# Patient Record
Sex: Male | Born: 1955 | Race: Black or African American | Hispanic: No | Marital: Single | Smoking: Former smoker
Health system: Southern US, Community
[De-identification: ages and names within clinical notes are randomized; demographics above are authoritative.]

## PROBLEM LIST (undated history)

## (undated) DIAGNOSIS — J449 Chronic obstructive pulmonary disease, unspecified: Secondary | ICD-10-CM

## (undated) DIAGNOSIS — I2510 Atherosclerotic heart disease of native coronary artery without angina pectoris: Secondary | ICD-10-CM

## (undated) DIAGNOSIS — I509 Heart failure, unspecified: Secondary | ICD-10-CM

## (undated) DIAGNOSIS — I10 Essential (primary) hypertension: Secondary | ICD-10-CM

---

## 2012-12-21 ENCOUNTER — Emergency Department (HOSPITAL_COMMUNITY)
Admission: EM | Admit: 2012-12-21 | Discharge: 2012-12-21 | Disposition: A | Discharge status: Home / Self Care | Payer: Self-pay | Attending: Emergency Medicine | Admitting: Emergency Medicine

## 2012-12-21 ENCOUNTER — Emergency Department (HOSPITAL_COMMUNITY): Payer: Self-pay

## 2012-12-21 ENCOUNTER — Encounter (HOSPITAL_COMMUNITY): Payer: Self-pay | Admitting: *Deleted

## 2012-12-21 DIAGNOSIS — F17200 Nicotine dependence, unspecified, uncomplicated: Secondary | ICD-10-CM | POA: Insufficient documentation

## 2012-12-21 DIAGNOSIS — J387 Other diseases of larynx: Secondary | ICD-10-CM | POA: Insufficient documentation

## 2012-12-21 DIAGNOSIS — J029 Acute pharyngitis, unspecified: Secondary | ICD-10-CM | POA: Insufficient documentation

## 2012-12-21 LAB — POCT I-STAT, CHEM 8
BUN: 19 mg/dL (ref 6–23)
Calcium, Ion: 1.06 mmol/L — ABNORMAL LOW (ref 1.12–1.23)
Chloride: 101 mEq/L (ref 96–112)
Creatinine, Ser: 0.7 mg/dL (ref 0.50–1.35)
TCO2: 28 mmol/L (ref 0–100)

## 2012-12-21 LAB — CBC WITH DIFFERENTIAL/PLATELET
Eosinophils Relative: 0 % (ref 0–5)
HCT: 45 % (ref 39.0–52.0)
Hemoglobin: 14.7 g/dL (ref 13.0–17.0)
Lymphocytes Relative: 17 % (ref 12–46)
Lymphs Abs: 2 10*3/uL (ref 0.7–4.0)
MCV: 77.9 fL — ABNORMAL LOW (ref 78.0–100.0)
Monocytes Absolute: 0.6 10*3/uL (ref 0.1–1.0)
Neutro Abs: 8.8 10*3/uL — ABNORMAL HIGH (ref 1.7–7.7)
RBC: 5.78 MIL/uL (ref 4.22–5.81)
WBC: 11.4 10*3/uL — ABNORMAL HIGH (ref 4.0–10.5)

## 2012-12-21 MED ORDER — IOHEXOL 300 MG/ML  SOLN
75.0000 mL | Freq: Once | INTRAMUSCULAR | Status: AC | PRN
  Administered 2012-12-21: 75 mL via INTRAVENOUS

## 2012-12-21 MED ORDER — PENICILLIN V POTASSIUM 250 MG/5ML PO SOLR: 500.0000 mg | Freq: Four times a day (QID) | ORAL | Status: DC

## 2012-12-21 MED ORDER — HYDROCODONE-ACETAMINOPHEN 7.5-500 MG/15ML PO SOLN: 10.0000 mL | Freq: Four times a day (QID) | ORAL | Status: DC | PRN

## 2012-12-21 MED ORDER — METOCLOPRAMIDE HCL 5 MG/ML IJ SOLN
10.0000 mg | Freq: Once | INTRAMUSCULAR | Status: AC
  Administered 2012-12-21: 10 mg via INTRAVENOUS
  Filled 2012-12-21: qty 2

## 2012-12-21 MED ORDER — DEXAMETHASONE SODIUM PHOSPHATE 4 MG/ML IJ SOLN
10.0000 mg | Freq: Once | INTRAMUSCULAR | Status: AC
  Administered 2012-12-21: 10 mg via INTRAVENOUS
  Filled 2012-12-21: qty 3

## 2012-12-21 MED ORDER — SODIUM CHLORIDE 0.9 % IV SOLN: 1000.0000 mL | INTRAVENOUS | Status: DC

## 2012-12-21 MED ORDER — SODIUM CHLORIDE 0.9 % IV SOLN
1000.0000 mL | Freq: Once | INTRAVENOUS | Status: AC
  Administered 2012-12-21: 1000 mL via INTRAVENOUS

## 2012-12-21 MED ORDER — MORPHINE SULFATE 2 MG/ML IJ SOLN
INTRAMUSCULAR | Status: AC
  Administered 2012-12-21: 4 mg via INTRAVENOUS
  Filled 2012-12-21: qty 2

## 2012-12-21 MED ORDER — CLINDAMYCIN PHOSPHATE 900 MG/50ML IV SOLN
900.0000 mg | Freq: Once | INTRAVENOUS | Status: AC
  Administered 2012-12-21: 900 mg via INTRAVENOUS
  Filled 2012-12-21: qty 50

## 2012-12-21 MED ORDER — DIPHENHYDRAMINE HCL 50 MG/ML IJ SOLN
25.0000 mg | Freq: Once | INTRAMUSCULAR | Status: AC
  Administered 2012-12-21: 25 mg via INTRAVENOUS
  Filled 2012-12-21: qty 1

## 2012-12-21 MED ORDER — MORPHINE SULFATE 4 MG/ML IJ SOLN: 4.0000 mg | Freq: Once | INTRAMUSCULAR | Status: AC

## 2012-12-21 NOTE — ED Notes (Signed)
Dr Knapp at bedside,  

## 2012-12-21 NOTE — ED Notes (Signed)
Pt c/o sore throat for the past  4 days, headache, pt states that it hurts to swallow, talk, voice sounds muffled when pt is speaking,

## 2012-12-21 NOTE — ED Notes (Signed)
Gave patient ginger ale as requested. Patient able to swallow and keep down fluids but does complain of pain with swallowing.

## 2012-12-21 NOTE — ED Notes (Signed)
Pt states that the pan medication helped with his throat but the headache he has been having has not changed, Dr. Lynelle Doctor notified,

## 2012-12-21 NOTE — ED Notes (Signed)
Pt states sore throat x 4 days. Voice is muffled, states it is difficult to talk or swallow. Pt states he has been drooling, especially at night and unable to sleep.

## 2012-12-21 NOTE — ED Provider Notes (Signed)
CSN: [id number]     Arrival date & time 12/21/12  1711 History   First MD Initiated Contact with Patient 12/21/12 1724     Chief Complaint  Patient presents with  . Sore Throat   (Consider location/radiation/quality/duration/timing/severity/associated sxs/prior Treatment) HPI Patient relates he's had a mild sore throat however has gotten worse the past 4 days. He states he has pain more on the right than the left. He denies ear pain but states his ears feel full. He denies fever but has had chills. He states he normally gets a sore throat about once a year but never had one this bad before. He denies having any shortness of breath. He states he is able to swallow but is extremely painful.   PCP none  History reviewed. No pertinent past medical history. History reviewed. No pertinent past surgical history. No family history on file. History  Substance Use Topics  . Smoking status: Current Every Day Smoker    Types: Cigarettes  . Smokeless tobacco: Not on file  . Alcohol Use: No  employed  Review of Systems  All other systems reviewed and are negative.    Allergies  Review of patient's allergies indicates no known allergies.  Home Medications  No current outpatient prescriptions on file.  BP 154/89  Pulse 100  Temp(Src) 99.6 F (37.6 C) (Oral)  Resp 19  Ht 5\' 9"  (1.753 m)  Wt 155 lb (70.308 kg)  BMI 22.88 kg/m2  SpO2 96%  Vital signs normal except tachycardia, low grade temp   Physical Exam  Nursing note and vitals reviewed. Constitutional: He is oriented to person, place, and time.  Non-toxic appearance. He does not appear ill. No distress.  Thin male  HENT:  Head: Normocephalic and atraumatic.  Right Ear: External ear normal.  Left Ear: External ear normal.  Nose: Nose normal. No mucosal edema or rhinorrhea.  Mouth/Throat: Uvula is midline and mucous membranes are normal. No dental abscesses or edematous. Posterior oropharyngeal erythema present.  Pt has  deeply reddened tonsils bilaterally, no soft palate swelling or asymmetry, pt has "hot potato voice". No drooling. Tongue dry. Tender under the tonsillar bases without cervical lymphadenopathy  Eyes: Conjunctivae and EOM are normal. Pupils are equal, round, and reactive to light.  Neck: Normal range of motion and full passive range of motion without pain. Neck supple.  Cardiovascular: Normal rate and regular rhythm.   Pulmonary/Chest: Effort normal. No respiratory distress. He has no rhonchi. He exhibits no crepitus.  Abdominal: Normal appearance.  Musculoskeletal: Normal range of motion. He exhibits no edema and no tenderness.  Moves all extremities well.   Neurological: He is alert and oriented to person, place, and time. He has normal strength. No cranial nerve deficit.  Skin: Skin is warm, dry and intact. No rash noted. No erythema. No pallor.  Psychiatric: He has a normal mood and affect. His speech is normal and behavior is normal. His mood appears not anxious.    ED Course  Procedures (including critical care time)  Medications  0.9 %  sodium chloride infusion (0 mLs Intravenous Stopped 12/21/12 2014)    Followed by  0.9 %  sodium chloride infusion (not administered)  morphine 4 MG/ML injection 4 mg (0 mg Intravenous Duplicate 12/21/12 1818)  clindamycin (CLEOCIN) IVPB 900 mg (0 mg Intravenous Stopped 12/21/12 2014)  dexamethasone (DECADRON) injection 10 mg (10 mg Intravenous Given 12/21/12 1815)  morphine 2 MG/ML injection (4 mg Intravenous Given 12/21/12 1816)  iohexol (OMNIPAQUE) 300 MG/ML solution  75 mL (75 mLs Intravenous Contrast Given 12/21/12 1841)  metoCLOPramide (REGLAN) injection 10 mg (10 mg Intravenous Given 12/21/12 1922)  diphenhydrAMINE (BENADRYL) injection 25 mg (25 mg Intravenous Given 12/21/12 1922)    PT is able to drink prior to leaving the ED.   We discussed his CT results and the need to be seen soon by ENT.   Labs Review  Results for orders placed during the  hospital encounter of 12/21/12  RAPID STREP SCREEN      Result Value Range   Streptococcus, Group A Screen (Direct) NEGATIVE  NEGATIVE  CBC WITH DIFFERENTIAL      Result Value Range   WBC 11.4 (*) 4.0 - 10.5 K/uL   RBC 5.78  4.22 - 5.81 MIL/uL   Hemoglobin 14.7  13.0 - 17.0 g/dL   HCT 57.8  46.9 - 62.9 %   MCV 77.9 (*) 78.0 - 100.0 fL   MCH 25.4 (*) 26.0 - 34.0 pg   MCHC 32.7  30.0 - 36.0 g/dL   RDW 52.8  41.3 - 24.4 %   Platelets 391  150 - 400 K/uL   Neutrophils Relative % 77  43 - 77 %   Neutro Abs 8.8 (*) 1.7 - 7.7 K/uL   Lymphocytes Relative 17  12 - 46 %   Lymphs Abs 2.0  0.7 - 4.0 K/uL   Monocytes Relative 6  3 - 12 %   Monocytes Absolute 0.6  0.1 - 1.0 K/uL   Eosinophils Relative 0  0 - 5 %   Eosinophils Absolute 0.0  0.0 - 0.7 K/uL   Basophils Relative 0  0 - 1 %   Basophils Absolute 0.0  0.0 - 0.1 K/uL  POCT I-STAT, CHEM 8      Result Value Range   Sodium 140  135 - 145 mEq/L   Potassium 3.4 (*) 3.5 - 5.1 mEq/L   Chloride 101  96 - 112 mEq/L   BUN 19  6 - 23 mg/dL   Creatinine, Ser 0.10  0.50 - 1.35 mg/dL   Glucose, Bld 96  70 - 99 mg/dL   Calcium, Ion 2.72 (*) 1.12 - 1.23 mmol/L   TCO2 28  0 - 100 mmol/L   Hemoglobin 17.0  13.0 - 17.0 g/dL   HCT 53.6  64.4 - 03.4 %   Laboratory interpretation all normal except leukocytosis   Imaging Review Ct Soft Tissue Neck W Contrast  12/21/2012   CLINICAL DATA:  Muffled speech and neck pain.  EXAM: CT NECK WITH CONTRAST  TECHNIQUE: Multidetector CT imaging of the neck was performed using the standard protocol following the bolus administration of intravenous contrast.  CONTRAST:  75mL OMNIPAQUE IOHEXOL 300 MG/ML  SOLN  COMPARISON:  None.  FINDINGS: The visualized portion of the brain is unremarkable. There is bilateral maxillary sinus and right sphenoid sinus disease with fairly extensive mucoperiosteal thickening. The mastoid air cells and middle ear cavities are clear except for some scattered fluid on the left  The 8  parotid and submandibular glands are unremarkable and appear symmetric bilaterally. The tongue base and floor of the mouth are unremarkable.  The epiglottis is normal and the paraglottic fat planes are maintained. The right area epiglottic fold appears slightly thickened and there is asymmetric enhancement. This appears to extend into a ill-defined enhancing masslike lesion measuring approximately 2 cm in the right supraglottic area at the level of the piriform sinus. I do not see a discrete rim enhancing abscess. It is  possible this could be enhancing inflammatory phlegmon but could not exclude the possibility of a mass. Recommend ENT consultation direct visualization.  There are borderline enlarged bilateral neck nodes extending down into the supraclavicular areas bilaterally. The thyroid gland is normal. The major vascular structures are intact. Dense carotid artery calcifications are noted near the bifurcation.  The lung apices are clear. Fairly extensive cervical spine degenerative disease with large anterior spurs.  IMPRESSION: 2 cm ill-defined area of enhancement and slight mass effect in the right supraglottic neck just below the right piriform sinus. This is not have the appearance of an abscess. It could be enhancing inflammatory phlegmon but is more worrisome for a neoplasm. Recommend ENT consultation.  Bilateral borderline neck nodes.   Electronically Signed   By: Loralie Champagne M.D.   On: 12/21/2012 19:16    MDM   1. Sore throat   2. Supraglottic mass    Discharge Medication List as of 12/21/2012  8:38 PM    START taking these medications   Details  HYDROcodone-acetaminophen (LORTAB) 7.5-500 MG/15ML solution Take 10 mLs by mouth every 6 (six) hours as needed for pain., Starting 12/21/2012, Until Discontinued, Print    penicillin v potassium (VEETID) 250 MG/5ML solution Take 10 mLs (500 mg total) by mouth 4 (four) times daily., Starting 12/21/2012, Until Discontinued, Print        Plan  discharge   Devoria Albe, MD, Franz Dell, MD 12/21/12 2111

## 2012-12-24 LAB — CULTURE, GROUP A STREP

## 2013-01-08 ENCOUNTER — Ambulatory Visit (INDEPENDENT_AMBULATORY_CARE_PROVIDER_SITE_OTHER): Payer: Self-pay | Admitting: Otolaryngology

## 2020-01-15 ENCOUNTER — Emergency Department (HOSPITAL_COMMUNITY): Payer: Medicare Other

## 2020-01-15 ENCOUNTER — Encounter (HOSPITAL_COMMUNITY): Payer: Self-pay | Admitting: *Deleted

## 2020-01-15 ENCOUNTER — Other Ambulatory Visit: Payer: Self-pay

## 2020-01-15 ENCOUNTER — Inpatient Hospital Stay (HOSPITAL_COMMUNITY)
Admission: EM | Admit: 2020-01-15 | Discharge: 2020-01-25 | DRG: 907 | Disposition: A | Discharge status: Home Health | Payer: Medicare Other | Attending: Family Medicine | Admitting: Family Medicine

## 2020-01-15 DIAGNOSIS — R1314 Dysphagia, pharyngoesophageal phase: Secondary | ICD-10-CM | POA: Diagnosis present

## 2020-01-15 DIAGNOSIS — Z7951 Long term (current) use of inhaled steroids: Secondary | ICD-10-CM

## 2020-01-15 DIAGNOSIS — K209 Esophagitis, unspecified without bleeding: Secondary | ICD-10-CM

## 2020-01-15 DIAGNOSIS — M272 Inflammatory conditions of jaws: Secondary | ICD-10-CM | POA: Diagnosis present

## 2020-01-15 DIAGNOSIS — I2510 Atherosclerotic heart disease of native coronary artery without angina pectoris: Secondary | ICD-10-CM | POA: Diagnosis present

## 2020-01-15 DIAGNOSIS — Z79899 Other long term (current) drug therapy: Secondary | ICD-10-CM

## 2020-01-15 DIAGNOSIS — J181 Lobar pneumonia, unspecified organism: Secondary | ICD-10-CM | POA: Diagnosis not present

## 2020-01-15 DIAGNOSIS — R627 Adult failure to thrive: Secondary | ICD-10-CM | POA: Diagnosis present

## 2020-01-15 DIAGNOSIS — R0600 Dyspnea, unspecified: Secondary | ICD-10-CM

## 2020-01-15 DIAGNOSIS — D509 Iron deficiency anemia, unspecified: Secondary | ICD-10-CM | POA: Diagnosis present

## 2020-01-15 DIAGNOSIS — K31819 Angiodysplasia of stomach and duodenum without bleeding: Secondary | ICD-10-CM | POA: Diagnosis not present

## 2020-01-15 DIAGNOSIS — E43 Unspecified severe protein-calorie malnutrition: Secondary | ICD-10-CM | POA: Diagnosis present

## 2020-01-15 DIAGNOSIS — R1319 Other dysphagia: Secondary | ICD-10-CM | POA: Diagnosis not present

## 2020-01-15 DIAGNOSIS — T85590A Other mechanical complication of bile duct prosthesis, initial encounter: Secondary | ICD-10-CM | POA: Diagnosis present

## 2020-01-15 DIAGNOSIS — Z4659 Encounter for fitting and adjustment of other gastrointestinal appliance and device: Secondary | ICD-10-CM | POA: Diagnosis not present

## 2020-01-15 DIAGNOSIS — J9620 Acute and chronic respiratory failure, unspecified whether with hypoxia or hypercapnia: Secondary | ICD-10-CM

## 2020-01-15 DIAGNOSIS — K922 Gastrointestinal hemorrhage, unspecified: Secondary | ICD-10-CM | POA: Diagnosis not present

## 2020-01-15 DIAGNOSIS — E876 Hypokalemia: Secondary | ICD-10-CM | POA: Diagnosis present

## 2020-01-15 DIAGNOSIS — J449 Chronic obstructive pulmonary disease, unspecified: Secondary | ICD-10-CM | POA: Diagnosis present

## 2020-01-15 DIAGNOSIS — R1011 Right upper quadrant pain: Secondary | ICD-10-CM

## 2020-01-15 DIAGNOSIS — Z20822 Contact with and (suspected) exposure to covid-19: Secondary | ICD-10-CM | POA: Diagnosis present

## 2020-01-15 DIAGNOSIS — T859XXA Unspecified complication of internal prosthetic device, implant and graft, initial encounter: Secondary | ICD-10-CM

## 2020-01-15 DIAGNOSIS — K8065 Calculus of gallbladder and bile duct with chronic cholecystitis with obstruction: Secondary | ICD-10-CM | POA: Diagnosis present

## 2020-01-15 DIAGNOSIS — R1013 Epigastric pain: Secondary | ICD-10-CM | POA: Diagnosis present

## 2020-01-15 DIAGNOSIS — I6521 Occlusion and stenosis of right carotid artery: Secondary | ICD-10-CM | POA: Diagnosis present

## 2020-01-15 DIAGNOSIS — Z7982 Long term (current) use of aspirin: Secondary | ICD-10-CM

## 2020-01-15 DIAGNOSIS — J441 Chronic obstructive pulmonary disease with (acute) exacerbation: Secondary | ICD-10-CM | POA: Diagnosis present

## 2020-01-15 DIAGNOSIS — J690 Pneumonitis due to inhalation of food and vomit: Secondary | ICD-10-CM | POA: Diagnosis not present

## 2020-01-15 DIAGNOSIS — K31811 Angiodysplasia of stomach and duodenum with bleeding: Secondary | ICD-10-CM | POA: Diagnosis present

## 2020-01-15 DIAGNOSIS — I5021 Acute systolic (congestive) heart failure: Secondary | ICD-10-CM | POA: Diagnosis not present

## 2020-01-15 DIAGNOSIS — K2100 Gastro-esophageal reflux disease with esophagitis, without bleeding: Secondary | ICD-10-CM | POA: Diagnosis present

## 2020-01-15 DIAGNOSIS — K851 Biliary acute pancreatitis without necrosis or infection: Secondary | ICD-10-CM | POA: Diagnosis present

## 2020-01-15 DIAGNOSIS — Z9981 Dependence on supplemental oxygen: Secondary | ICD-10-CM

## 2020-01-15 DIAGNOSIS — K449 Diaphragmatic hernia without obstruction or gangrene: Secondary | ICD-10-CM | POA: Diagnosis present

## 2020-01-15 DIAGNOSIS — I110 Hypertensive heart disease with heart failure: Secondary | ICD-10-CM | POA: Diagnosis present

## 2020-01-15 DIAGNOSIS — J9621 Acute and chronic respiratory failure with hypoxia: Secondary | ICD-10-CM | POA: Diagnosis not present

## 2020-01-15 DIAGNOSIS — Z6822 Body mass index (BMI) 22.0-22.9, adult: Secondary | ICD-10-CM | POA: Diagnosis not present

## 2020-01-15 DIAGNOSIS — I10 Essential (primary) hypertension: Secondary | ICD-10-CM | POA: Diagnosis present

## 2020-01-15 DIAGNOSIS — K8050 Calculus of bile duct without cholangitis or cholecystitis without obstruction: Secondary | ICD-10-CM | POA: Diagnosis not present

## 2020-01-15 DIAGNOSIS — I4891 Unspecified atrial fibrillation: Secondary | ICD-10-CM | POA: Diagnosis present

## 2020-01-15 DIAGNOSIS — I5042 Chronic combined systolic (congestive) and diastolic (congestive) heart failure: Secondary | ICD-10-CM | POA: Diagnosis present

## 2020-01-15 DIAGNOSIS — F17210 Nicotine dependence, cigarettes, uncomplicated: Secondary | ICD-10-CM | POA: Diagnosis present

## 2020-01-15 DIAGNOSIS — J440 Chronic obstructive pulmonary disease with acute lower respiratory infection: Secondary | ICD-10-CM | POA: Diagnosis not present

## 2020-01-15 DIAGNOSIS — Y732 Prosthetic and other implants, materials and accessory gastroenterology and urology devices associated with adverse incidents: Secondary | ICD-10-CM | POA: Diagnosis present

## 2020-01-15 DIAGNOSIS — K831 Obstruction of bile duct: Secondary | ICD-10-CM

## 2020-01-15 DIAGNOSIS — K2289 Other specified disease of esophagus: Secondary | ICD-10-CM | POA: Diagnosis not present

## 2020-01-15 DIAGNOSIS — I509 Heart failure, unspecified: Secondary | ICD-10-CM

## 2020-01-15 DIAGNOSIS — Z8673 Personal history of transient ischemic attack (TIA), and cerebral infarction without residual deficits: Secondary | ICD-10-CM

## 2020-01-15 HISTORY — DX: Chronic obstructive pulmonary disease, unspecified: J44.9

## 2020-01-15 HISTORY — DX: Heart failure, unspecified: I50.9

## 2020-01-15 HISTORY — DX: Essential (primary) hypertension: I10

## 2020-01-15 HISTORY — DX: Atherosclerotic heart disease of native coronary artery without angina pectoris: I25.10

## 2020-01-15 LAB — URINALYSIS, ROUTINE W REFLEX MICROSCOPIC
Bilirubin Urine: NEGATIVE
Glucose, UA: NEGATIVE mg/dL
Hgb urine dipstick: NEGATIVE
Ketones, ur: NEGATIVE mg/dL
Leukocytes,Ua: NEGATIVE
Nitrite: NEGATIVE
Protein, ur: NEGATIVE mg/dL
Specific Gravity, Urine: 1.016 (ref 1.005–1.030)
pH: 5 (ref 5.0–8.0)

## 2020-01-15 LAB — COMPREHENSIVE METABOLIC PANEL
ALT: 73 U/L — ABNORMAL HIGH (ref 0–44)
AST: 90 U/L — ABNORMAL HIGH (ref 15–41)
Albumin: 2.6 g/dL — ABNORMAL LOW (ref 3.5–5.0)
Alkaline Phosphatase: 681 U/L — ABNORMAL HIGH (ref 38–126)
Anion gap: 14 (ref 5–15)
BUN: 15 mg/dL (ref 8–23)
CO2: 32 mmol/L (ref 22–32)
Calcium: 8.7 mg/dL — ABNORMAL LOW (ref 8.9–10.3)
Chloride: 88 mmol/L — ABNORMAL LOW (ref 98–111)
Creatinine, Ser: 0.39 mg/dL — ABNORMAL LOW (ref 0.61–1.24)
GFR, Estimated: >60 mL/min (ref >60)
Glucose, Bld: 103 mg/dL — ABNORMAL HIGH (ref 70–99)
Potassium: 2.7 mmol/L — CL (ref 3.5–5.1)
Sodium: 134 mmol/L — ABNORMAL LOW (ref 135–145)
Total Bilirubin: 2.6 mg/dL — ABNORMAL HIGH (ref 0.3–1.2)
Total Protein: 7.7 g/dL (ref 6.5–8.1)

## 2020-01-15 LAB — CBC WITH DIFFERENTIAL/PLATELET
Abs Immature Granulocytes: 0.08 10*3/uL — ABNORMAL HIGH (ref 0.00–0.07)
Basophils Absolute: 0 10*3/uL (ref 0.0–0.1)
Basophils Relative: 0 %
Eosinophils Absolute: 0 10*3/uL (ref 0.0–0.5)
Eosinophils Relative: 0 %
HCT: 33.1 % — ABNORMAL LOW (ref 39.0–52.0)
Hemoglobin: 10.2 g/dL — ABNORMAL LOW (ref 13.0–17.0)
Immature Granulocytes: 1 %
Lymphocytes Relative: 10 %
Lymphs Abs: 1.5 10*3/uL (ref 0.7–4.0)
MCH: 25.3 pg — ABNORMAL LOW (ref 26.0–34.0)
MCHC: 30.8 g/dL (ref 30.0–36.0)
MCV: 82.1 fL (ref 80.0–100.0)
Monocytes Absolute: 0.9 10*3/uL (ref 0.1–1.0)
Monocytes Relative: 6 %
Neutro Abs: 12.4 10*3/uL — ABNORMAL HIGH (ref 1.7–7.7)
Neutrophils Relative %: 83 %
Platelets: 586 10*3/uL — ABNORMAL HIGH (ref 150–400)
RBC: 4.03 MIL/uL — ABNORMAL LOW (ref 4.22–5.81)
RDW: 15.3 % (ref 11.5–15.5)
WBC: 14.8 10*3/uL — ABNORMAL HIGH (ref 4.0–10.5)
nRBC: 0 % (ref 0.0–0.2)

## 2020-01-15 LAB — RESP PANEL BY RT PCR (RSV, FLU A&B, COVID)
Influenza A by PCR: NEGATIVE
Influenza B by PCR: NEGATIVE
Respiratory Syncytial Virus by PCR: NEGATIVE
SARS Coronavirus 2 by RT PCR: NEGATIVE

## 2020-01-15 LAB — MAGNESIUM: Magnesium: 2 mg/dL (ref 1.7–2.4)

## 2020-01-15 LAB — LIPASE, BLOOD: Lipase: 21 U/L (ref 11–51)

## 2020-01-15 MED ORDER — HYDROMORPHONE HCL 1 MG/ML IJ SOLN
0.5000 mg | INTRAMUSCULAR | Status: DC | PRN
  Administered 2020-01-15 – 2020-01-20 (×39): 0.5 mg via INTRAVENOUS
  Filled 2020-01-15 (×6): qty 1
  Filled 2020-01-15: qty 0.5
  Filled 2020-01-15 (×7): qty 1
  Filled 2020-01-15: qty 0.5
  Filled 2020-01-15: qty 1
  Filled 2020-01-15: qty 0.5
  Filled 2020-01-15 (×4): qty 1
  Filled 2020-01-15: qty 0.5
  Filled 2020-01-15 (×2): qty 1
  Filled 2020-01-15: qty 0.5
  Filled 2020-01-15 (×4): qty 1
  Filled 2020-01-15 (×4): qty 0.5
  Filled 2020-01-15 (×3): qty 1
  Filled 2020-01-15: qty 0.5
  Filled 2020-01-15: qty 1
  Filled 2020-01-15: qty 0.5
  Filled 2020-01-15 (×2): qty 1

## 2020-01-15 MED ORDER — POTASSIUM CHLORIDE 10 MEQ/100ML IV SOLN
10.0000 meq | INTRAVENOUS | Status: AC
  Administered 2020-01-15 (×3): 10 meq via INTRAVENOUS
  Filled 2020-01-15 (×3): qty 100

## 2020-01-15 MED ORDER — LISINOPRIL 5 MG PO TABS
2.5000 mg | ORAL_TABLET | Freq: Every day | ORAL | Status: DC
  Administered 2020-01-15: 2.5 mg via ORAL
  Filled 2020-01-15: qty 1

## 2020-01-15 MED ORDER — CARVEDILOL 12.5 MG PO TABS
12.5000 mg | ORAL_TABLET | Freq: Two times a day (BID) | ORAL | Status: DC
  Administered 2020-01-15: 12.5 mg via ORAL
  Filled 2020-01-15: qty 1

## 2020-01-15 MED ORDER — SPIRONOLACTONE 25 MG PO TABS
25.0000 mg | ORAL_TABLET | Freq: Every day | ORAL | Status: DC
  Administered 2020-01-16 – 2020-01-25 (×9): 25 mg via ORAL
  Filled 2020-01-15 (×14): qty 1

## 2020-01-15 MED ORDER — DIGOXIN 125 MCG PO TABS
125.0000 ug | ORAL_TABLET | Freq: Every day | ORAL | Status: DC
  Administered 2020-01-16 – 2020-01-24 (×5): 125 ug via ORAL
  Filled 2020-01-15 (×16): qty 1

## 2020-01-15 MED ORDER — IOHEXOL 300 MG/ML  SOLN
100.0000 mL | Freq: Once | INTRAMUSCULAR | Status: AC | PRN
  Administered 2020-01-15: 100 mL via INTRAVENOUS

## 2020-01-15 MED ORDER — ONDANSETRON HCL 4 MG/2ML IJ SOLN: 4.0000 mg | Freq: Four times a day (QID) | INTRAMUSCULAR | Status: DC | PRN

## 2020-01-15 MED ORDER — ONDANSETRON HCL 4 MG/2ML IJ SOLN
4.0000 mg | Freq: Once | INTRAMUSCULAR | Status: AC
  Administered 2020-01-15: 4 mg via INTRAVENOUS
  Filled 2020-01-15: qty 2

## 2020-01-15 MED ORDER — POTASSIUM CHLORIDE CRYS ER 20 MEQ PO TBCR
40.0000 meq | EXTENDED_RELEASE_TABLET | Freq: Two times a day (BID) | ORAL | Status: DC
  Administered 2020-01-15 – 2020-01-20 (×11): 40 meq via ORAL
  Filled 2020-01-15 (×15): qty 2

## 2020-01-15 MED ORDER — SODIUM CHLORIDE 0.9 % IV SOLN: INTRAVENOUS | Status: DC

## 2020-01-15 MED ORDER — ONDANSETRON HCL 4 MG PO TABS
4.0000 mg | ORAL_TABLET | Freq: Four times a day (QID) | ORAL | Status: DC | PRN
  Filled 2020-01-15: qty 1

## 2020-01-15 MED ORDER — HYDROMORPHONE HCL 1 MG/ML IJ SOLN
1.0000 mg | Freq: Once | INTRAMUSCULAR | Status: AC
  Administered 2020-01-15: 1 mg via INTRAVENOUS
  Filled 2020-01-15: qty 1

## 2020-01-15 NOTE — Consult Note (Signed)
Maylon Peppers, M.D. Gastroenterology & Hepatology                                           Patient Name: Kenneth Coleman Account #: $[id number] @   MRN: [id number] Admission Date: 01/15/2020 Date of Evaluation:  01/15/2020 Time of Evaluation: 8:03 PM   Referring Physician: Christ Kick, MD  Chief Complaint: Dysphagia and choledocholithiasis  HPI:  This is a 64 y.o. male with history of COPD on home oxygn, CHF, HTN, coronary artery disease, hypertension, who comes to the hospital for evaluation of dysphagia and choledocholithiasis.  The patient is a poor historian.  The patient states he has presented progressive dysphagia to solids for the last 2 months.  He has had to bring significant amount of liquids to make the food go down but more recently he has only been able to tolerate liquids.  He is trying to drink Ensure as much as possible but he reports that his weight has been decreasing significantly, states that he has lost close to 40 pounds.  He has not presented any drooling but he is not able to eat any solid food at the moment.  He states having previous heartburn episodes but he is not having these anymore.  He also reports having intermittent episodes of epigastric pain and RUQ area for the last couple months.  States that the pain gets worse when food goes down to his stomach.  Has not presented any nausea, vomiting, fever, chills, abdominal distention, hematochezia or melena.  He is having regular bowel movements.  The patient brings some reports from [facility] on 12/11/2019.  At that time a CT of his chest showed thickening in the proximal esophagus, which was concerning for possible esophagitis versus malignancy.  No lymphadenopathy was detected at that time.  Also, a CT of the abdomen was performed due to elevated liver enzymes which showed presence of choledocholithiasis with intra and extrahepatic biliary dilation.  Given the fact there was no on-call gastroenterology  at the facility, the patient was discharged from the hospital with GI follow-up but he did not see a gastroenterologist after it.  Patient brings the reports of these tests but imaging is not available.  Notably, the patient reports that he had "a pancreatic procedure 18 to 20 years ago at [facility]".  No reports are available.  He does not know if he had an ERCP performed in the past or the reason why he had this test performed previously.  In the ED, he was HD stable and afebrile. Labs were remarkable for leukocytosis of 14,000, thrombocytosis 586, normocytic anemia with hemoglobin of 10.2 and MCV 82, potassium 2.7, chloride 88, BUN 19 elevated with alkaline phosphatase 681, AST 90, ALT 73, globulin 2.6, decreased albumin 3.6.  A CT of the abdomen with IV contrast showed presence of moderate intra and extrahepatic biliary ductal dilation with presence of biliary plastic stent.  The CBD is dilated up to 17 mm with a possible calculus in the common hepatic duct adjacent to the stent, although this could also represent stent encrustation.  Gallbladder is moderately distended without wall thickening.  There is diffuse pancreatic ductal dilation and parenchymal atrophy but no mass is seen.  Last EGD: never Last Colonoscopy: never  FHx: neg for any gastrointestinal/liver disease, no malignancies Social: smokes 1 pack of cigarettes every 3 days, neg alcohol.  He smokes  marijuana on a daily basis.  Denies any other illicit drug use  Past Medical History: SEE CHRONIC ISSSUES: Past Medical History:  Diagnosis Date  . CHF (congestive heart failure) ([place])   . COPD (chronic obstructive pulmonary disease) ([place])   . Coronary artery disease   . Hypertension    Past Surgical History: History reviewed. No pertinent surgical history. Family History: No family history on file. Social History:  Social History   Tobacco Use  . Smoking status: Current Every Day Smoker    Types: Cigarettes  . Smokeless tobacco:  Never Used  Vaping Use  . Vaping Use: Never used  Substance Use Topics  . Alcohol use: No  . Drug use: Never    Home Medications:  Prior to Admission medications   Medication Sig Start Date End Date Taking? Authorizing Provider  ASPIRIN LOW DOSE 81 MG EC tablet Take 81 mg by mouth daily. 12/22/19  Yes [provider]  BEVESPI AEROSPHERE 9-4.8 MCG/ACT AERO Take 2 puffs by mouth daily.  12/22/19  Yes [provider]  cholecalciferol (VITAMIN D) 25 MCG (1000 UNIT) tablet Take 1,000 Units by mouth daily.   Yes [provider]  furosemide (LASIX) 20 MG tablet Take 20 mg by mouth daily. 12/22/19  Yes [provider]  spironolactone (ALDACTONE) 25 MG tablet Take 25 mg by mouth daily. 12/22/19  Yes [provider]  carvedilol (COREG) 12.5 MG tablet Take 12.5 mg by mouth 2 (two) times daily. 11/18/19   [provider]  digoxin (LANOXIN) 0.125 MG tablet Take 125 mcg by mouth daily. 10/22/19   [provider]  HYDROcodone-acetaminophen (LORTAB) 7.5-500 MG/15ML solution Take 10 mLs by mouth every 6 (six) hours as needed for pain. Patient not taking: Reported on 01/15/2020 12/21/12   Rolland Porter, MD  lisinopril (ZESTRIL) 2.5 MG tablet Take 2.5 mg by mouth at bedtime. 10/06/19   [provider]  penicillin v potassium (VEETID) 250 MG/5ML solution Take 10 mLs (500 mg total) by mouth 4 (four) times daily. Patient not taking: Reported on 01/15/2020 12/21/12   Rolland Porter, MD    Inpatient Medications:  Current Facility-Administered Medications:  .  0.9 %  sodium chloride infusion, , Intravenous, Continuous, [name], DO, Last Rate: 100 mL/hr at 01/15/20 1946, New Bag at 01/15/20 1946 .  carvedilol (COREG) tablet 12.5 mg, 12.5 mg, Oral, BID, Stinson, Jacob J, DO .  digoxin (LANOXIN) tablet 125 mcg, 125 mcg, Oral, Daily, [name], DO .  HYDROmorphone (DILAUDID) injection 0.5 mg, 0.5 mg, Intravenous, Q2H PRN, [name], DO, 0.5  mg at 01/15/20 1925 .  lisinopril (ZESTRIL) tablet 2.5 mg, 2.5 mg, Oral, QHS, Stinson, Jacob J, DO .  ondansetron (ZOFRAN) tablet 4 mg, 4 mg, Oral, Q6H PRN **OR** ondansetron (ZOFRAN) injection 4 mg, 4 mg, Intravenous, Q6H PRN, Stinson, Jacob J, DO .  potassium chloride SA (KLOR-CON) CR tablet 40 mEq, 40 mEq, Oral, BID, Stinson, Jacob J, DO .  spironolactone (ALDACTONE) tablet 25 mg, 25 mg, Oral, Daily, [name], DO  Current Outpatient Medications:  .  ASPIRIN LOW DOSE 81 MG EC tablet, Take 81 mg by mouth daily., Disp: , Rfl:  .  BEVESPI AEROSPHERE 9-4.8 MCG/ACT AERO, Take 2 puffs by mouth daily. , Disp: , Rfl:  .  cholecalciferol (VITAMIN D) 25 MCG (1000 UNIT) tablet, Take 1,000 Units by mouth daily., Disp: , Rfl:  .  furosemide (LASIX) 20 MG tablet, Take 20 mg by mouth daily., Disp: ,  Rfl:  .  spironolactone (ALDACTONE) 25 MG tablet, Take 25 mg by mouth daily., Disp: , Rfl:  .  carvedilol (COREG) 12.5 MG tablet, Take 12.5 mg by mouth 2 (two) times daily., Disp: , Rfl:  .  digoxin (LANOXIN) 0.125 MG tablet, Take 125 mcg by mouth daily., Disp: , Rfl:  .  HYDROcodone-acetaminophen (LORTAB) 7.5-500 MG/15ML solution, Take 10 mLs by mouth every 6 (six) hours as needed for pain. (Patient not taking: Reported on 01/15/2020), Disp: 120 mL, Rfl: 0 .  lisinopril (ZESTRIL) 2.5 MG tablet, Take 2.5 mg by mouth at bedtime., Disp: , Rfl:  .  penicillin v potassium (VEETID) 250 MG/5ML solution, Take 10 mLs (500 mg total) by mouth 4 (four) times daily. (Patient not taking: Reported on 01/15/2020), Disp: 200 mL, Rfl: 0 Allergies: Patient has no known allergies.  Complete Review of Systems: GENERAL: negative for malaise, night sweats HEENT: No changes in hearing or vision, no nose bleeds or other nasal problems. NECK: Negative for lumps, goiter, pain and significant neck swelling RESPIRATORY: Negative for cough, wheezing CARDIOVASCULAR: Negative for chest pain, leg swelling, palpitations, orthopnea GI:  SEE HPI MUSCULOSKELETAL: Negative for joint pain or swelling, back pain, and muscle pain. SKIN: Negative for lesions, rash PSYCH: Negative for sleep disturbance, mood disorder and recent psychosocial stressors. HEMATOLOGY Negative for prolonged bleeding, bruising easily, and swollen nodes. ENDOCRINE: Negative for cold or heat intolerance, polyuria, polydipsia and goiter. NEURO: negative for tremor, gait imbalance, syncope and seizures. The remainder of the review of systems is noncontributory.  Physical Exam: BP 114/61   Pulse 61   Temp 98 F (36.7 C) (Oral)   Resp 17   SpO2 98%  GENERAL: The patient is AO x3, in no acute distress. Malnourished. HEENT: Head is normocephalic and atraumatic. EOMI are intact. Mouth is well hydrated and without lesions. Has temporal wasting. NECK: Supple. No masses LUNGS: Clear to auscultation. No presence of rhonchi/wheezing/rales. Adequate chest expansion HEART: RRR, normal s1 and s2. ABDOMEN: tender to palpation in the epigastric and RUQ region, no guarding, no peritoneal signs, and nondistended. BS +. No masses. EXTREMITIES: Without any cyanosis, clubbing, rash, lesions or edema. NEUROLOGIC: AOx3, no focal motor deficit. SKIN: no jaundice, no rashes   Laboratory Data CBC:     Component Value Date/Time   WBC 14.8 (H) 01/15/2020 1336   RBC 4.03 (L) 01/15/2020 1336   HGB 10.2 (L) 01/15/2020 1336   HCT 33.1 (L) 01/15/2020 1336   PLT 586 (H) 01/15/2020 1336   MCV 82.1 01/15/2020 1336   MCH 25.3 (L) 01/15/2020 1336   MCHC 30.8 01/15/2020 1336   RDW 15.3 01/15/2020 1336   LYMPHSABS 1.5 01/15/2020 1336   MONOABS 0.9 01/15/2020 1336   EOSABS 0.0 01/15/2020 1336   BASOSABS 0.0 01/15/2020 1336   COAG: No results found for: INR, PROTIME  BMP:  BMP Latest Ref Rng & Units 01/15/2020 12/21/2012  Glucose 70 - 99 mg/dL 103(H) 96  BUN 8 - 23 mg/dL 15 19  Creatinine 0.61 - 1.24 mg/dL 0.39(L) 0.70  Sodium 135 - 145 mmol/L 134(L) 140  Potassium 3.5 - 5.1  mmol/L 2.7(LL) 3.4(L)  Chloride 98 - 111 mmol/L 88(L) 101  CO2 22 - 32 mmol/L 32 -  Calcium 8.9 - 10.3 mg/dL 8.7(L) -    HEPATIC:  Hepatic Function Latest Ref Rng & Units 01/15/2020  Total Protein 6.5 - 8.1 g/dL 7.7  Albumin 3.5 - 5.0 g/dL 2.6(L)  AST 15 - 41 U/L 90(H)  ALT 0 -  44 U/L 73(H)  Alk Phosphatase 38 - 126 U/L 681(H)  Total Bilirubin 0.3 - 1.2 mg/dL 2.6(H)    CARDIAC: No results found for: CKTOTAL, CKMB, CKMBINDEX, TROPONINI   Imaging: I personally reviewed and interpreted the available imaging.  Assessment & Plan: Dalante Minus is a 64 y.o. male with history of COPD on home oxygn, CHF, HTN, coronary artery disease, hypertension, who comes to the hospital for evaluation of dysphagia and choledocholithiasis.  The patient has presented failure to thrive severe dysphagia that has limited his food intake and has led to major weight loss.  Given report of CT scan done at outside facility, there is high concern for malignancy in his esophagus.  Even though he has imaging findings that are concerning for choledocholithiasis versus retained stent in his CBD leading to ductal dilation  (unclear reason why the stent was placed in the first place at this moment), it may not be possible to pass down the ERCP scope if there is significant narrowing in his esophagus.  Due to these, we will hold on performing an ERCP at the moment until the patient has had an EGD for evaluation of his esophageal alteration.  For now, you know he is not presenting any SIRS, I consider he would benefit from antibiotic coverage as he has had leukocytosis which likely is secondary to biliary origin.  Based on findings of ED, will proceed with ERCP versus possible PTC.  The patient understood and agreed.  # Dysphagia # Esophageal thickening # Choledocholithiasis vs stent encrustation - EGD tomorrow AM - NPO after MN - Correct electrolytes aggressively before procedure - Daily CBC and CMP - Start Flagyl 500 mg IV  and ciprofloxacin 400 mg BID IV  Harvel Quale, MD Gastroenterology and Hepatology [facility]   Note: Occasional unusual wording and randomly placed punctuation marks may result from the use of speech recognition technology to transcribe this document

## 2020-01-15 NOTE — ED Provider Notes (Signed)
[facility] EMERGENCY DEPARTMENT Provider Note   CSN: [id number] Arrival date & time: 01/15/20  1208     History Chief Complaint  Patient presents with  . Abdominal Pain    Kenneth Coleman is a 64 y.o. male who presents emergency department chief complaint of abdominal pain and weight loss.  The patient is a very poor historian and the majority of the history is gathered from review of the patient's electronic medical records and outside medical records.  He complains of epigastric abdominal pain that is constant but much worse every time he tries to eat.  The pain is severe.  He has nausea but denies any active vomiting.  He is able to take in small sips of water but states that sometimes he "feels like it wants to come up."  He has never had anything like this before however this has been going on for some time.  Review of EMR shows that the patient was seen initially at [facility] and was found to have a 12 mm stone with moderate to severe biliary dilatation, bilirubin of 2.5, alk phos of 742 and a white count of 12.  They had tried to transfer the patient to a facility with capability of MRCP or ERCP however the patient states that he waited for very long time and eventually left.  Apparently this scenario again occurred at [facility] ED where the patient left AGAINST MEDICAL ADVICE because he states the wait was so long.  This was 01/13/2020.  The patient presents today for the same symptoms with severe pain.  He reports a 40 pound weight loss.  He has a history of COPD and is on constant oxygen at 2 L via nasal cannula.  He denies fevers or chills.  HPI     History reviewed. No pertinent past medical history.  There are no problems to display for this patient.   History reviewed. No pertinent surgical history.     No family history on file.  Social History   Tobacco Use  . Smoking status: Current Every Day Smoker    Types: Cigarettes  . Smokeless tobacco: Never Used    Vaping Use  . Vaping Use: Never used  Substance Use Topics  . Alcohol use: No  . Drug use: Never    Home Medications Prior to Admission medications   Medication Sig Start Date End Date Taking? Authorizing Provider  ASPIRIN LOW DOSE 81 MG EC tablet Take 81 mg by mouth daily. 12/22/19  Yes [provider]  BEVESPI AEROSPHERE 9-4.8 MCG/ACT AERO Take 2 puffs by mouth daily.  12/22/19  Yes [provider]  cholecalciferol (VITAMIN D) 25 MCG (1000 UNIT) tablet Take 1,000 Units by mouth daily.   Yes [provider]  furosemide (LASIX) 20 MG tablet Take 20 mg by mouth daily. 12/22/19  Yes [provider]  spironolactone (ALDACTONE) 25 MG tablet Take 25 mg by mouth daily. 12/22/19  Yes [provider]  carvedilol (COREG) 12.5 MG tablet Take 12.5 mg by mouth 2 (two) times daily. 11/18/19   [provider]  digoxin (LANOXIN) 0.125 MG tablet Take 125 mcg by mouth daily. 10/22/19   [provider]  HYDROcodone-acetaminophen (LORTAB) 7.5-500 MG/15ML solution Take 10 mLs by mouth every 6 (six) hours as needed for pain. Patient not taking: Reported on 01/15/2020 12/21/12   Rolland Porter, MD  lisinopril (ZESTRIL) 2.5 MG tablet Take 2.5 mg by mouth at bedtime. 10/06/19   [provider]  penicillin  v potassium (VEETID) 250 MG/5ML solution Take 10 mLs (500 mg total) by mouth 4 (four) times daily. Patient not taking: Reported on 01/15/2020 12/21/12   Rolland Porter, MD    Allergies    Patient has no known allergies.  Review of Systems   Review of Systems Ten systems reviewed and are negative for acute change, except as noted in the HPI.   Physical Exam Updated Vital Signs BP 107/60 (BP Location: Right Arm)   Pulse (!) 56   Temp 98 F (36.7 C) (Oral)   Resp 18   SpO2 100%   Physical Exam Vitals and nursing note reviewed.  Constitutional:      General: He is not in acute distress.    Appearance: He is well-developed and underweight. He is  not diaphoretic.  HENT:     Head: Normocephalic and atraumatic.  Eyes:     General: No scleral icterus.    Conjunctiva/sclera: Conjunctivae normal.  Cardiovascular:     Rate and Rhythm: Normal rate and regular rhythm.     Heart sounds: Normal heart sounds.  Pulmonary:     Effort: Pulmonary effort is normal. No respiratory distress.     Breath sounds: Normal breath sounds.  Abdominal:     General: Abdomen is scaphoid.     Palpations: Abdomen is soft.     Tenderness: There is abdominal tenderness in the right upper quadrant and epigastric area.  Musculoskeletal:     Cervical back: Normal range of motion and neck supple.  Skin:    General: Skin is warm and dry.  Neurological:     Mental Status: He is alert.  Psychiatric:        Behavior: Behavior normal.     ED Results / Procedures / Treatments   Labs (all labs ordered are listed, but only abnormal results are displayed) Labs Reviewed  CBC WITH DIFFERENTIAL/PLATELET - Abnormal; Notable for the following components:      Result Value   WBC 14.8 (*)    RBC 4.03 (*)    Hemoglobin 10.2 (*)    HCT 33.1 (*)    MCH 25.3 (*)    Platelets 586 (*)    Neutro Abs 12.4 (*)    Abs Immature Granulocytes 0.08 (*)    All other components within normal limits  COMPREHENSIVE METABOLIC PANEL - Abnormal; Notable for the following components:   Sodium 134 (*)    Potassium 2.7 (*)    Chloride 88 (*)    Glucose, Bld 103 (*)    Creatinine, Ser 0.39 (*)    Calcium 8.7 (*)    Albumin 2.6 (*)    AST 90 (*)    ALT 73 (*)    Alkaline Phosphatase 681 (*)    Total Bilirubin 2.6 (*)    All other components within normal limits  URINALYSIS, ROUTINE W REFLEX MICROSCOPIC - Abnormal; Notable for the following components:   Color, Urine AMBER (*)    All other components within normal limits  RESP PANEL BY RT PCR (RSV, FLU A&B, COVID)  LIPASE, BLOOD    EKG None  Radiology No results found.  Procedures Procedures (including critical care  time)  Medications Ordered in ED Medications  potassium chloride 10 mEq in 100 mL IVPB (10 mEq Intravenous New Bag/Given 01/15/20 1509)  HYDROmorphone (DILAUDID) injection 1 mg (1 mg Intravenous Given 01/15/20 1509)  ondansetron (ZOFRAN) injection 4 mg (4 mg Intravenous Given 01/15/20 1509)  iohexol (OMNIPAQUE) 300 MG/ML solution 100 mL (100 mLs  Intravenous Contrast Given 01/15/20 1557)    ED Course  I have reviewed the triage vital signs and the nursing notes.  Pertinent labs & imaging results that were available during my care of the patient were reviewed by me and considered in my medical decision making (see chart for details).  Clinical Course as of Jan 15 1607  Fri Jan 15, 2020  1458 Alkaline Phosphatase(!): 681 [AH]    Clinical Course User Index [AH] Margarita Mail, PA-C   MDM Rules/Calculators/A&P                         CC: Abdominal pain vomiting and weight loss VS:  Vitals:   01/15/20 1830 01/15/20 1900 01/15/20 1927 01/15/20 2000  BP: 113/63 114/61  (!) 113/56  Pulse: 68 61  80  Resp: _0 Temp:      TempSrc:      SpO2: 96% 98% 100% 100%    SF:KCLEXNT is gathered by EMR and patient. Previous records obtained and reviewed. DDX:The patient's complaint of abdominal pain involves an extensive number of diagnostic and treatment options, and is a complaint that carries with it a high risk of complications, morbidity, and potential mortality. Given the large differential diagnosis, medical decision making is of high complexity. The emergent DDX for RUQ pain includes but is not limited to Glabladder disease, PUD, Acute Hepatitis, Pancreatitis, pyelonephritis, Pneumonia, Lower lobe PE/Infarct, Kidney stone, GERD, retrocecal appendicitis, Fitz-Hugh-Curtis syndrome, AAA, MI, Zoster. Labs: I ordered reviewed and interpreted labs which included CBC with elevated white blood cell count at 14.8, urine amber-colored.  Covid test is negative.  Lipase within normal limits.  CMP  showed's potassium of 2.7, mildly low calcium.  Albumin low at 2.6  Imaging: I ordered and reviewed images which included RUQ ultrasound of the abdomen, CT abdomen pelvis with contrast. I independently visualized and interpreted all imaging. Significant findings include moderate biliary and intrahepatic ductal dilatation along with pancreatic ductal dilatation, plastic stent which is not functioning and what appears to be recurrent biliary obstruction. EKG atrial fibrillation at a rate of 47 Consults: Dr. Jenetta Downer with gastroenterology.  Patient will be admitted to the hospitalist service, he will have ERCP performed tomorrow. MDM: Patient here with biliary obstruction.  He is having severe weight loss, chronic pain.  Patient will need admission.  Pain control achieved with pain medications would have ordered every 2 hours.  Fluids given. Patient disposition:The patient appears reasonably stabilized for admission considering the current resources, flow, and capabilities available in the ED at this time, and I doubt any other [facility] requiring further screening and/or treatment in the ED prior to admission.         Final Clinical Impression(s) / ED Diagnoses Final diagnoses:  RUQ abdominal pain    Rx / DC Orders ED Discharge Orders    None       Margarita Mail, PA-C 01/15/20 2119    Daleen Bo, MD 01/15/20 2318

## 2020-01-15 NOTE — ED Notes (Signed)
CRITICAL VALUE ALERT  Critical Value: kt 2.7  Date & Time Notied:  0525    Provider Notified: [email or web address]  Orders Received/Actions taken:

## 2020-01-15 NOTE — ED Triage Notes (Signed)
Abdominal pain with weight loss for 2 months

## 2020-01-15 NOTE — H&P (Signed)
History and Physical  Kenneth Coleman MGQ:[id number] DOB: 1956/03/02 DOA: 01/15/2020  Referring physician: Margarita Mail, PA-C, ED provider PCP: System, Provider Not In  Outpatient Specialists:   Patient Coming From: home  Chief Complaint: abdominal pain  HPI: Kenneth Coleman is a 64 y.o. male with a history of COPD, coronary artery disease with CHF, hypertension.  Patient is a poor historian.  He presents to the emergency department for 2 months of generalized weakness and difficulty eating with right upper quadrant abdominal pain.  In September, he was hospitalized in [place], found to have intrahepatic and extrahepatic biliary duct dilation with stones.  It appears that he had an ERCP done with a stent placement.  He was continued to have intermittent abdominal pain, the last of which was started last night.  Pain is in his right upper quadrant and is nonradiating.  Eating increases his pain.  No palliating factors.  Pain was worsening over the past day.  Denies fevers, chills, diarrhea, constipation, abdominal distention, shortness of breath, chest pain.  Emergency Department Course: CT abdomen and right upper quadrant ultrasound show significant common bile duct distention to approximately 15 mm.  Additionally, appears to be a stone in the common bile duct.  CMP shows elevated LFTs.  Lipase is negative.  Potassium 2.7  Review of Systems:   Pt denies any fevers, chills, diarrhea, constipation, shortness of breath, dyspnea on exertion, orthopnea, cough, wheezing, palpitations, headache, vision changes, lightheadedness, dizziness, melena, rectal bleeding.  Review of systems are otherwise negative  Past Medical History:  Diagnosis Date  . CHF (congestive heart failure) ([facility])   . COPD (chronic obstructive pulmonary disease) ([place])   . Coronary artery disease   . Hypertension    History reviewed. No pertinent surgical history. Social History:  reports that he has been smoking  cigarettes. He has never used smokeless tobacco. He reports that he does not drink alcohol and does not use drugs. Patient lives at home  No Known Allergies  No family history on file.  Family history reviewed.  Noncontributory  Prior to Admission medications   Medication Sig Start Date End Date Taking? Authorizing Provider  ASPIRIN LOW DOSE 81 MG EC tablet Take 81 mg by mouth daily. 12/22/19  Yes [provider]  BEVESPI AEROSPHERE 9-4.8 MCG/ACT AERO Take 2 puffs by mouth daily.  12/22/19  Yes [provider]  cholecalciferol (VITAMIN D) 25 MCG (1000 UNIT) tablet Take 1,000 Units by mouth daily.   Yes [provider]  furosemide (LASIX) 20 MG tablet Take 20 mg by mouth daily. 12/22/19  Yes [provider]  spironolactone (ALDACTONE) 25 MG tablet Take 25 mg by mouth daily. 12/22/19  Yes [provider]  carvedilol (COREG) 12.5 MG tablet Take 12.5 mg by mouth 2 (two) times daily. 11/18/19   [provider]  digoxin (LANOXIN) 0.125 MG tablet Take 125 mcg by mouth daily. 10/22/19   [provider]  HYDROcodone-acetaminophen (LORTAB) 7.5-500 MG/15ML solution Take 10 mLs by mouth every 6 (six) hours as needed for pain. Patient not taking: Reported on 01/15/2020 12/21/12   Rolland Porter, MD  lisinopril (ZESTRIL) 2.5 MG tablet Take 2.5 mg by mouth at bedtime. 10/06/19   [provider]  penicillin v potassium (VEETID) 250 MG/5ML solution Take 10 mLs (500 mg total) by mouth 4 (four) times daily. Patient not taking: Reported on 01/15/2020 12/21/12   Rolland Porter, MD    Physical Exam: BP 113/63   Pulse 68   Temp 98  F (36.7 C) (Oral)   Resp 17   SpO2 96%   . General: Elderly male. Awake and alert and oriented x3. No acute cardiopulmonary distress.  Marland Kitchen HEENT: Normocephalic atraumatic.  Right and left ears normal in appearance.  Pupils equal, round, reactive to light. Extraocular muscles are intact. Sclerae anicteric and noninjected.  Moist  mucosal membranes. No mucosal lesions.  . Neck: Neck supple without lymphadenopathy. No carotid bruits. No masses palpated.  . Cardiovascular: Regular rate with normal S1-S2 sounds. No murmurs, rubs, gallops auscultated. No JVD.  Marland Kitchen Respiratory: Good respiratory effort with no wheezes, rales, rhonchi. Lungs clear to auscultation bilaterally.  No accessory muscle use. . Abdomen: Soft.  Tenderness in right upper quadrant and epigastric area with guarding active bowel sounds. No masses or hepatosplenomegaly  . Skin: No rashes, lesions, or ulcerations.  Dry, warm to touch. 2+ dorsalis pedis and radial pulses. . Musculoskeletal: No calf or leg pain. All major joints not erythematous nontender.  No upper or lower joint deformation.  Good ROM.  No contractures  . Psychiatric: Intact judgment and insight. Pleasant and cooperative. . Neurologic: No focal neurological deficits. Strength is 5/5 and symmetric in upper and lower extremities.  Cranial nerves II through XII are grossly intact.           Labs on Admission: I have personally reviewed following labs and imaging studies  CBC: Recent Labs  Lab 01/15/20 1336  WBC 14.8*  NEUTROABS 12.4*  HGB 10.2*  HCT 33.1*  MCV 82.1  PLT 762*   Basic Metabolic Panel: Recent Labs  Lab 01/15/20 1336  NA 134*  K 2.7*  CL 88*  CO2 32  GLUCOSE 103*  BUN 15  CREATININE 0.39*  CALCIUM 8.7*   GFR: CrCl cannot be calculated (Unknown ideal weight.). Liver Function Tests: Recent Labs  Lab 01/15/20 1336  AST 90*  ALT 73*  ALKPHOS 681*  BILITOT 2.6*  PROT 7.7  ALBUMIN 2.6*   Recent Labs  Lab 01/15/20 1336  LIPASE 21   No results for input(s): AMMONIA in the last 168 hours. Coagulation Profile: No results for input(s): INR, PROTIME in the last 168 hours. Cardiac Enzymes: No results for input(s): CKTOTAL, CKMB, CKMBINDEX, TROPONINI in the last 168 hours. BNP (last 3 results) No results for input(s): PROBNP in the last 8760  hours. HbA1C: No results for input(s): HGBA1C in the last 72 hours. CBG: No results for input(s): GLUCAP in the last 168 hours. Lipid Profile: No results for input(s): CHOL, HDL, LDLCALC, TRIG, CHOLHDL, LDLDIRECT in the last 72 hours. Thyroid Function Tests: No results for input(s): TSH, T4TOTAL, FREET4, T3FREE, THYROIDAB in the last 72 hours. Anemia Panel: No results for input(s): VITAMINB12, FOLATE, FERRITIN, TIBC, IRON, RETICCTPCT in the last 72 hours. Urine analysis:    Component Value Date/Time   COLORURINE AMBER (A) 01/15/2020 1355   APPEARANCEUR CLEAR 01/15/2020 1355   LABSPEC 1.016 01/15/2020 1355   PHURINE 5.0 01/15/2020 1355   GLUCOSEU NEGATIVE 01/15/2020 1355   HGBUR NEGATIVE 01/15/2020 [place] 01/15/2020 1355   KETONESUR NEGATIVE 01/15/2020 1355   PROTEINUR NEGATIVE 01/15/2020 1355   NITRITE NEGATIVE 01/15/2020 1355   LEUKOCYTESUR NEGATIVE 01/15/2020 1355   Sepsis Labs: @LABRCNTIP (procalcitonin:4,lacticidven:4) ) Recent Results (from the past 240 hour(s))  Resp Panel by RT PCR (RSV, Flu A&B, Covid) - Nasopharyngeal Swab     Status: None   Collection Time: 01/15/20  1:48 PM   Specimen: Nasopharyngeal Swab  Result Value Ref Range Status  SARS Coronavirus 2 by RT PCR NEGATIVE NEGATIVE Final    Comment: (NOTE) SARS-CoV-2 target nucleic acids are NOT DETECTED.  The SARS-CoV-2 RNA is generally detectable in upper respiratoy specimens during the acute phase of infection. The lowest concentration of SARS-CoV-2 viral copies this assay can detect is 131 copies/mL. A negative result does not preclude SARS-Cov-2 infection and should not be used as the sole basis for treatment or other patient management decisions. A negative result may occur with  improper specimen collection/handling, submission of specimen other than nasopharyngeal swab, presence of viral mutation(s) within the areas targeted by this assay, and inadequate number of viral  copies (<131 copies/mL). A negative result must be combined with clinical observations, patient history, and epidemiological information. The expected result is Negative.  Fact Sheet for Patients:  PinkCheek.be  Fact Sheet for Healthcare Providers:  GravelBags.it  This test is no t yet approved or cleared by the [place] FDA and  has been authorized for detection and/or diagnosis of SARS-CoV-2 by FDA under an Emergency Use Authorization (EUA). This EUA will remain  in effect (meaning this test can be used) for the duration of the COVID-19 declaration under Section 564(b)(1) of the Act, 21 U.S.C. section 360bbb-3(b)(1), unless the authorization is terminated or revoked sooner.     Influenza A by PCR NEGATIVE NEGATIVE Final   Influenza B by PCR NEGATIVE NEGATIVE Final    Comment: (NOTE) The Xpert Xpress SARS-CoV-2/FLU/RSV assay is intended as an aid in  the diagnosis of influenza from Nasopharyngeal swab specimens and  should not be used as a sole basis for treatment. Nasal washings and  aspirates are unacceptable for Xpert Xpress SARS-CoV-2/FLU/RSV  testing.  Fact Sheet for Patients: PinkCheek.be  Fact Sheet for Healthcare Providers: GravelBags.it  This test is not yet approved or cleared by the [place] FDA and  has been authorized for detection and/or diagnosis of SARS-CoV-2 by  FDA under an Emergency Use Authorization (EUA). This EUA will remain  in effect (meaning this test can be used) for the duration of the  Covid-19 declaration under Section 564(b)(1) of the Act, 21  U.S.C. section 360bbb-3(b)(1), unless the authorization is  terminated or revoked.    Respiratory Syncytial Virus by PCR NEGATIVE NEGATIVE Final    Comment: (NOTE) Fact Sheet for Patients: PinkCheek.be  Fact Sheet for Healthcare  Providers: GravelBags.it  This test is not yet approved or cleared by the [place] FDA and  has been authorized for detection and/or diagnosis of SARS-CoV-2 by  FDA under an Emergency Use Authorization (EUA). This EUA will remain  in effect (meaning this test can be used) for the duration of the  COVID-19 declaration under Section 564(b)(1) of the Act, 21 U.S.C.  section 360bbb-3(b)(1), unless the authorization is terminated or  revoked. Performed at [facility], [address], [place], [place] [address]      Radiological Exams on Admission: CT ABDOMEN PELVIS W CONTRAST  Result Date: 01/15/2020 CLINICAL DATA:  Abdominal pain with 40 lb weight loss over the last 2 months. Abdominal abscess/infection suspected. EXAM: CT ABDOMEN AND PELVIS WITH CONTRAST TECHNIQUE: Multidetector CT imaging of the abdomen and pelvis was performed using the standard protocol following bolus administration of intravenous contrast. CONTRAST:  164mL OMNIPAQUE IOHEXOL 300 MG/ML  SOLN COMPARISON:  Report only from remote abdominal CT 09/07/2008 and ERCP 09/09/2008. FINDINGS: Lower chest: Mild linear scarring in the right middle lobe. The lung bases are otherwise clear. No significant pleural or pericardial effusion. Aortic and  coronary artery atherosclerosis noted. Hepatobiliary: No focal hepatic abnormalities or abnormal enhancement identified. There is moderate intra and extrahepatic biliary dilatation. There is a plastic biliary stent in place. The common hepatic duct appears to measure up to 17 mm in diameter on image 31/2 which is larger than appreciated on ultrasound the same date. There is a possible calculus in the common hepatic duct adjacent to the stent versus stent encrustation. In addition, there is an 11 mm soft tissue nodule at the hepatic hilum on image 27/2 which could be intrabiliary. The gallbladder is moderately distended without wall thickening or surrounding inflammation.  Pancreas: Mild diffuse pancreatic ductal dilatation and parenchymal atrophy, previously reported. No focal mass lesion or surrounding inflammation. Spleen: Normal in size without focal abnormality. Adrenals/Urinary Tract: Both adrenal glands appear normal. The kidneys appear normal without evidence of urinary tract calculus, suspicious lesion or hydronephrosis. No bladder abnormalities are seen. Stomach/Bowel: No evidence of bowel wall thickening, distention or surrounding inflammatory change. Vascular/Lymphatic: No enlarged abdominopelvic lymph nodes are identified. Evaluation limited by paucity of intra-abdominal fat. There is diffuse aortic and branch vessel atherosclerosis with severe involvement of the common iliac arteries. No evidence of large vessel occlusion or aneurysm. The portal, superior mesenteric and splenic veins are patent. Reproductive: The prostate gland and seminal vesicles appear normal. Other: Paucity of intra-abdominal fat. No ascites or peritoneal nodularity. Musculoskeletal: No acute or significant osseous findings. Old left-sided rib fracture in advanced degenerative disc disease at L5-S1 are noted. IMPRESSION: 1. Moderate intra and extrahepatic biliary and gallbladder dilatation suspicious for recurrent biliary obstruction. A plastic biliary stent in place which is likely not functional. There is a possible calculus in the common hepatic duct adjacent to the stent versus stent encrustation. In addition, there is an 11 mm soft tissue nodule at the hepatic hilum which could be intrabiliary. Consider further evaluation with ERCP or MRCP. 2. Pancreatic ductal dilatation, previously reported on remote CT (unavailable). 3. Aortic Atherosclerosis (ICD10-I70.0). Electronically Signed   By: Richardean Sale M.D.   On: 01/15/2020 16:52   US ABDOMEN LIMITED RUQ  Result Date: 01/15/2020 CLINICAL DATA:  Right upper quadrant abdominal pain. History of biliary stenting for stricture in 2010. EXAM:  ULTRASOUND ABDOMEN LIMITED RIGHT UPPER QUADRANT COMPARISON:  Report only from abdominal CT 09/07/2008 and ERCP 09/09/2008. Today CT is correlated. FINDINGS: Gallbladder: The sonographer reports a shadowing calculus within the gallbladder fundus. In correlating the ultrasound images with the nearly concurrent abdominal CT, I suspect this 11 mm calculus is actually within a dilated common hepatic duct. The gallbladder was moderately distended on CT and not clearly imaged on this study. Common bile duct: Diameter: The reported diameter of the common bile duct is 5 mm. However, as discussed above, there is significant intra and extrahepatic biliary dilatation as correlated with CT. The common hepatic duct measures up to 15 mm in diameter. The biliary stent is not well visualized on these images. Intraductal filling defect measuring 11 mm, most consistent with choledocholithiasis. Liver: Mildly increased hepatic echogenicity without focal abnormality. Intrahepatic biliary dilatation. Portal vein is patent on color Doppler imaging with normal direction of blood flow towards the liver. Other: None. IMPRESSION: 1. This ultrasound, which was performed immediately prior to abdominal CT, does not optimally portray the perceived abnormalities on CT. Combining the 2 studies, there is moderate intra and extrahepatic biliary dilatation in this patient with a biliary stent. There is a probable intraductal filling defect adjacent to the proximal aspect of the stent consistent with  choledocholithiasis. 2. See separate CT report. Electronically Signed   By: Richardean Sale M.D.   On: 01/15/2020 17:05    EKG: Independently reviewed.  Pending  Assessment/Plan: Principal Problem:   Choledocholithiasis Active Problems:   CHF (congestive heart failure) ([facility])   COPD (chronic obstructive pulmonary disease) ([facility])   Coronary artery disease   Hypertension   Hypokalemia    This patient was discussed with the ED physician,  including pertinent vitals, physical exam findings, labs, and imaging.  We also discussed care given by the ED provider.  1. Choledocholithiasis a. Admit b. Clear liquids c. N.p.o. after midnight d. GI consulted by EDP and patient to be prepared for ERCP tomorrow e. CMP tomorrow f. Pain control with IV pain medicine 2. Hypokalemia a. Replace potassium b. Check magnesium c. Recheck potassium tomorrow 3. Hypertension a. Continue home meds 4. Coronary artery disease 5. COPD a. Stable 6. CHF a. Compensated  DVT prophylaxis: SCDs -hold chemo prophylaxis until after procedure Consultants: GI Code Status: Full code Family Communication: None Disposition Plan: Patient should be able to return home following improvement    [name], DO

## 2020-01-15 NOTE — ED Provider Notes (Signed)
  Face-to-face evaluation   History: He presents for anorexia and losing weight.  He was at another facility today and left AGAINST MEDICAL ADVICE.  Physical exam: Alert cachectic male, who is calm comfortable.  Abdomen soft but moderately tender.  Sclera are without icterus.  Medical screening examination/treatment/procedure(s) were conducted as a shared visit with non-physician practitioner(s) and myself.  I personally evaluated the patient during the encounter    Daleen Bo, MD 01/15/20 2318

## 2020-01-16 ENCOUNTER — Inpatient Hospital Stay (HOSPITAL_COMMUNITY): Payer: Medicare Other

## 2020-01-16 ENCOUNTER — Inpatient Hospital Stay (HOSPITAL_COMMUNITY): Payer: Medicare Other | Admitting: Anesthesiology

## 2020-01-16 ENCOUNTER — Other Ambulatory Visit: Payer: Self-pay

## 2020-01-16 ENCOUNTER — Encounter (HOSPITAL_COMMUNITY)
Admission: EM | Disposition: A | Discharge status: Home Health | Payer: Self-pay | Source: Home / Self Care | Attending: Family Medicine

## 2020-01-16 ENCOUNTER — Encounter (HOSPITAL_COMMUNITY): Payer: Self-pay | Admitting: Family Medicine

## 2020-01-16 DIAGNOSIS — K31819 Angiodysplasia of stomach and duodenum without bleeding: Secondary | ICD-10-CM

## 2020-01-16 DIAGNOSIS — K2289 Other specified disease of esophagus: Secondary | ICD-10-CM

## 2020-01-16 DIAGNOSIS — E876 Hypokalemia: Secondary | ICD-10-CM | POA: Diagnosis not present

## 2020-01-16 DIAGNOSIS — K449 Diaphragmatic hernia without obstruction or gangrene: Secondary | ICD-10-CM

## 2020-01-16 DIAGNOSIS — K8050 Calculus of bile duct without cholangitis or cholecystitis without obstruction: Secondary | ICD-10-CM | POA: Diagnosis not present

## 2020-01-16 DIAGNOSIS — J9621 Acute and chronic respiratory failure with hypoxia: Secondary | ICD-10-CM | POA: Diagnosis not present

## 2020-01-16 HISTORY — PX: ESOPHAGOGASTRODUODENOSCOPY (EGD) WITH PROPOFOL: SHX5813

## 2020-01-16 HISTORY — PX: BIOPSY: SHX5522

## 2020-01-16 LAB — COMPREHENSIVE METABOLIC PANEL
ALT: 62 U/L — ABNORMAL HIGH (ref 0–44)
AST: 73 U/L — ABNORMAL HIGH (ref 15–41)
Albumin: 2.2 g/dL — ABNORMAL LOW (ref 3.5–5.0)
Alkaline Phosphatase: 547 U/L — ABNORMAL HIGH (ref 38–126)
Anion gap: 9 (ref 5–15)
BUN: 10 mg/dL (ref 8–23)
CO2: 31 mmol/L (ref 22–32)
Calcium: 8.4 mg/dL — ABNORMAL LOW (ref 8.9–10.3)
Chloride: 96 mmol/L — ABNORMAL LOW (ref 98–111)
Creatinine, Ser: 0.35 mg/dL — ABNORMAL LOW (ref 0.61–1.24)
GFR, Estimated: >60 mL/min (ref >60)
Glucose, Bld: 73 mg/dL (ref 70–99)
Potassium: 3.6 mmol/L (ref 3.5–5.1)
Sodium: 136 mmol/L (ref 135–145)
Total Bilirubin: 2.6 mg/dL — ABNORMAL HIGH (ref 0.3–1.2)
Total Protein: 6.8 g/dL (ref 6.5–8.1)

## 2020-01-16 LAB — CBC
HCT: 29.2 % — ABNORMAL LOW (ref 39.0–52.0)
Hemoglobin: 8.8 g/dL — ABNORMAL LOW (ref 13.0–17.0)
MCH: 25.6 pg — ABNORMAL LOW (ref 26.0–34.0)
MCHC: 30.1 g/dL (ref 30.0–36.0)
MCV: 84.9 fL (ref 80.0–100.0)
Platelets: 476 10*3/uL — ABNORMAL HIGH (ref 150–400)
RBC: 3.44 MIL/uL — ABNORMAL LOW (ref 4.22–5.81)
RDW: 15.4 % (ref 11.5–15.5)
WBC: 19.6 10*3/uL — ABNORMAL HIGH (ref 4.0–10.5)
nRBC: 0 % (ref 0.0–0.2)

## 2020-01-16 LAB — PROCALCITONIN: Procalcitonin: 1.73 ng/mL

## 2020-01-16 LAB — D-DIMER, QUANTITATIVE: D-Dimer, Quant: 0.84 ug/mL-FEU — ABNORMAL HIGH (ref 0.00–0.50)

## 2020-01-16 LAB — TSH: TSH: 1.072 u[IU]/mL (ref 0.350–4.500)

## 2020-01-16 LAB — HIV ANTIBODY (ROUTINE TESTING W REFLEX): HIV Screen 4th Generation wRfx: NONREACTIVE

## 2020-01-16 LAB — T4, FREE: Free T4: 0.93 ng/dL (ref 0.61–1.12)

## 2020-01-16 LAB — FOLATE: Folate: 10.4 ng/mL (ref >5.9)

## 2020-01-16 LAB — VITAMIN B12: Vitamin B-12: 428 pg/mL (ref 180–914)

## 2020-01-16 SURGERY — ESOPHAGOGASTRODUODENOSCOPY (EGD) WITH PROPOFOL
Anesthesia: Monitor Anesthesia Care

## 2020-01-16 MED ORDER — PROPOFOL 10 MG/ML IV BOLUS
INTRAVENOUS | Status: AC
  Filled 2020-01-16: qty 20

## 2020-01-16 MED ORDER — HYDROMORPHONE HCL 1 MG/ML IJ SOLN
0.5000 mg | Freq: Once | INTRAMUSCULAR | Status: AC
  Administered 2020-01-16: 0.5 mg via INTRAVENOUS

## 2020-01-16 MED ORDER — LIDOCAINE VISCOUS HCL 2 % MT SOLN
OROMUCOSAL | Status: AC
  Filled 2020-01-16: qty 15

## 2020-01-16 MED ORDER — BUDESONIDE 0.5 MG/2ML IN SUSP
0.5000 mg | Freq: Two times a day (BID) | RESPIRATORY_TRACT | Status: DC
  Administered 2020-01-16 – 2020-01-25 (×17): 0.5 mg via RESPIRATORY_TRACT
  Filled 2020-01-16 (×19): qty 2

## 2020-01-16 MED ORDER — IOHEXOL 300 MG/ML  SOLN
75.0000 mL | Freq: Once | INTRAMUSCULAR | Status: AC | PRN
  Administered 2020-01-16: 75 mL via INTRAVENOUS

## 2020-01-16 MED ORDER — SODIUM CHLORIDE 0.9 % IV SOLN
1.0000 g | Freq: Three times a day (TID) | INTRAVENOUS | Status: DC
  Administered 2020-01-16 – 2020-01-17 (×3): 1 g via INTRAVENOUS
  Filled 2020-01-16 (×9): qty 1

## 2020-01-16 MED ORDER — CARVEDILOL 6.25 MG PO TABS
6.2500 mg | ORAL_TABLET | Freq: Two times a day (BID) | ORAL | Status: DC
  Administered 2020-01-16 – 2020-01-25 (×15): 6.25 mg via ORAL
  Filled 2020-01-16: qty 2
  Filled 2020-01-16: qty 1
  Filled 2020-01-16 (×3): qty 2
  Filled 2020-01-16 (×2): qty 1
  Filled 2020-01-16: qty 2
  Filled 2020-01-16 (×3): qty 1
  Filled 2020-01-16: qty 2
  Filled 2020-01-16 (×5): qty 1
  Filled 2020-01-16 (×2): qty 2
  Filled 2020-01-16 (×3): qty 1

## 2020-01-16 MED ORDER — IPRATROPIUM-ALBUTEROL 0.5-2.5 (3) MG/3ML IN SOLN
3.0000 mL | Freq: Four times a day (QID) | RESPIRATORY_TRACT | Status: DC
  Administered 2020-01-16 – 2020-01-18 (×7): 3 mL via RESPIRATORY_TRACT
  Filled 2020-01-16 (×10): qty 3

## 2020-01-16 MED ORDER — SODIUM CHLORIDE 0.9 % IV SOLN: INTRAVENOUS | Status: DC

## 2020-01-16 NOTE — Progress Notes (Signed)
  Echocardiogram 2D Echocardiogram was attempted but patient in endoscopy lab. We will try again the schedule permits.   Jennette Dubin 01/16/2020, 1:48 PM

## 2020-01-16 NOTE — Brief Op Note (Addendum)
01/15/2020 - 01/16/2020  3:06 PM  PATIENT:  Kenneth Coleman  64 y.o. male  PRE-OPERATIVE DIAGNOSIS:  dysphagia  POST-OPERATIVE DIAGNOSIS:  AVM duodenal areax2, hiatal hernia;esophageal biopsies @25cm;  PROCEDURE:  Procedure(s) with comments: ESOPHAGOGASTRODUODENOSCOPY (EGD) WITH PROPOFOL (N/A) BIOPSY - esophageal biopsies @25,37,20  SURGEON:  Surgeon(s) and Role:    * Castaneda Mayorga, Daniel, MD - Primary  Procedure was given today to be performed under propofol.  However before however before starting the procedure the patient presented desaturation while on 2.5 liters of oxygen by nasal cannula.  The patient was asymptomatic at that time.  His saturation was in the mid 70s.  A long discussion was held with the patient regarding the risks and possible need of intubation if the patient was sedated with propofol which she understood the patient agreed to undergo EGD without sedation. Patient received jelly lidocaine in his mouth.  Given the risk of further desaturation while on supplementary oxygen, as well as the concern for stricture, the unsedated EGD was performed with a ultra slim spaghetti scope.    The scope was advanced up to the duodenum without presence of any stricture or resistance upon passage of the scope.There was presence of 2 non bleeding AVMs in his duodenum. He had a moderate size hiatal hernia. No gastric alterations were observed. Upon careful inspection of the esophagus, I visualized areas of decreased vascularity with white plaques and pallor between 20-25 cm (also visualized under NBI). Biopsies were taken at 20, 25 and 37 cm. No masses or other alterations were found in the esophagus.  Recommendations: - Return patient to hospital ward for ongoing care. - Clear liquid diet. - Perform barium CT neck and chest today. - Await pathology results. - Consideration for PTC by IR/ERCP at tertiary center as patient presents severe desaturation while awake on 4L  oxygen.  Daniel Castaneda, MD Gastroenterology and Hepatology [facility] 

## 2020-01-16 NOTE — ED Notes (Signed)
Have paged respiratory for nebulizing treatments

## 2020-01-16 NOTE — Anesthesia Postprocedure Evaluation (Signed)
Anesthesia Post Note  Patient: Kenneth Coleman  Procedure(s) Performed: ESOPHAGOGASTRODUODENOSCOPY (EGD) WITH PROPOFOL (N/A ) BIOPSY  Patient location during evaluation: PACU Anesthesia Type: MAC Level of consciousness: awake Pain management: pain level controlled Vital Signs Assessment: post-procedure vital signs reviewed and stable Respiratory status: spontaneous breathing Anesthetic complications: no   No complications documented.   Last Vitals:  Vitals:   01/16/20 0900 01/16/20 0946  BP: (!) 118/59   Pulse: (!) 57   Resp: 12   Temp:    SpO2: 90% 90%    Last Pain:  Vitals:   01/16/20 0806  TempSrc:   PainSc: 0-No pain                 Louann Sjogren

## 2020-01-16 NOTE — Progress Notes (Signed)
PT Cancellation Note  Patient Details Name: Kenneth Coleman MRN: [id number] DOB: [birth year]   Cancelled Treatment:    Reason Eval/Treat Not Completed: Patient at procedure or test/unavailable; Attempted PT evaluation but patient was not available as he was at a procedure/test. Will check back when able.    10:34 AM, 01/16/20 Mearl Latin PT, DPT Physical Therapist at [facility]

## 2020-01-16 NOTE — Progress Notes (Signed)
PROGRESS NOTE  Delia Sitar CNO:[id number] DOB: 11-22-1955 DOA: 01/15/2020 PCP: System, Provider Not In  Brief History:  64 year old male with a history of COPD, chronic respiratory failure on 2.5 L, hypertension, coronary disease, CHF presenting with dysphagia, abdominal pain, weight loss, and generalized weakness.  The patient is a poor historian.  Most of the information is obtained from review of medical records.  The patient has had progressive dysphagia to solid food over the last 2 months.  He has had decreased oral intake.  He has been drinking Ensure as much as possible.  He endorses a 40 pound weight loss over the past few months.  In addition, he has been having intermittent episodes of epigastric and right upper quadrant pain exacerbated by eating.  He denies any actual vomiting, nausea, fevers, chills, abdominal distention, hematochezia, melena, dysuria, hematuria.  He is having normal bowel movements.  Apparently, the patient was recently admitted to [facility] in [place] and [place] where he had a 5-day hospital stay.  A CT of the abdomen and pelvis at that time showed dilated intrahepatic and extrahepatic biliary ducts secondary to choledocholithiasis.  There was no on-call gastroenterology at that facility.  The patient was ultimately discharged from the hospital with instructions for GI follow-up.  He did not follow-up with GI. Because of abdominal pain, he went to [facility] emergency department on 01/13/2020.  They recommended transfer to a tertiary care facility.  However, the patient left AGAINST MEDICAL ADVICE.  Nevertheless, he continued to have dysphagia, generalized weakness, and intermittent abdominal pain.  As result, he presented to [facility] for further evaluation. Imaging at [facility] also showed circumferential wall thickening of the proximal esophagus concerning for esophagitis or infiltrative process.  There is also concern for a periapical lucency and area of bony  destruction in the left maxilla. In the emergency department, the patient had oxygen saturation 88 to 91% on 6 L.  He was afebrile with soft blood pressures.  GI was consulted and is planning for endoscopy.  BMP showed potassium 2.7.  AST 90, ALT 73, alk phosphatase 681, total bilirubin 2.6, lipase 21.  WBC 14.8, hemoglobin 10.2, platelets 5 86,000.  CT of the abdomen and pelvis showed moderate intrahepatic and extrahepatic biliary ductal dilatation.  There was a plastic biliary stent in place.  There is possible calculus in the common hepatic duct adjacent to the stent.  There is also pancreatic ductal dilatation.  Right upper quadrant ultrasound showed moderate intrahepatic and extrahepatic biliary duct dilatation with biliary stent.  There is also a probable intraductal filling defect adjacent to the proximal aspect of the stent consistent with choledocholithiasis.  Assessment/Plan: Dysphagia/esophageal thickening -12/10/19 CT H&N--showed proximal esophageal thickening -concerned about malignancy or other infiltrative process -appreciate GI -planning EGD 10/9 -start PPI  Acute on chronic respiratory failure with hypoxia -normally on 2.5L at home  -now on 6L with saturation 88-91% -likely due to COPD exacerbation -no distress or increase WOB -COVID negative -obtain CXR -D-dimer--0.84 -check PCT--1.73 -start duonebs -start pulmicort -CTA chest  Choledocholithiasis -12/11/19 CT at [facility] initially noted -01/15/20 CT abd/pelvis--as discussed above -01/15/20 RUQ [place] as above -per GI -may ultimately need ERCP if possible -agree with empiric antibiotics per GI  Chronic osteomyelitis of left Maxilla -12/10/19 CT head and neck with periapical bony reabsorption/bony destruction with lucency and empty socket in lext  Maxilla posterior to 2 remaining teeth  Chronic CHF -type unspecified -continue coreg at lower  dose due to soft BPs -obtain Echo -appears clinically euvolemic -suspect  systolic CHF given use of digoxin, lisinopril and spironolactone -continue spiro as BP allows  COPD/Chronic respiratory failure with hypoxia -on 2.5L at home -start duonebs -start pulmicort  LLL Opacity -start IV abx for HCAP -MRSA screen -PCT 1.73  Tobacco abuse -continues to smoke -at least 30 pk year hx  Failure to Thrive -B12--428 -TSH--1.072 -folate--10.4 -will need PT eval once stable  Hypokalemia -repleted -check mag  Coronary Artery Disease -no chest pain presently   Status is: Inpatient  Remains inpatient appropriate because:IV treatments appropriate due to intensity of illness or inability to take PO   Dispo: The patient is from: Home              Anticipated d/c is to: Home              Anticipated d/c date is: 2 days              Patient currently is not medically stable to d/c.        Family Communication:  no Family at bedside  Consultants:  GI  Code Status:  FULL  DVT Prophylaxis:  SCDs   Procedures: As Listed in Progress Note Above  Antibiotics: None     Subjective: Patient denies cp, n/v/d.  Complains of dysphagia with solids.  Denies f/c.  Has epigastric pain, controlled with opioids.  Objective: Vitals:   01/16/20 0400 01/16/20 0500 01/16/20 0600 01/16/20 0700  BP: (!) 93/49 (!) 105/48 (!) 142/56 (!) 130/56  Pulse: 62 (!) 50 (!) 58 (!) 58  Resp: (!) $RemoveB'22 14 20 12  'UxCwLBUe$ Temp:      TempSrc:      SpO2: 90% 91% (!) 89% (!) 85%    Intake/Output Summary (Last 24 hours) at 01/16/2020 0832 Last data filed at 01/16/2020 0529 Gross per 24 hour  Intake 1069.48 ml  Output 575 ml  Net 494.48 ml   Weight change:  Exam:   General:  Pt is alert, follows commands appropriately, not in acute distress  HEENT: No icterus, No thrush, No neck mass, [place]/AT  Cardiovascular: RRR, S1/S2, no rubs, no gallops  Respiratory: diminished BS.  No wheeze.  Bibasilar rales  Abdomen: Soft/+BS, non tender, non distended, no guarding  Extremities:  No edema, No lymphangitis, No petechiae, No rashes, no synovitis   Data Reviewed: I have personally reviewed following labs and imaging studies Basic Metabolic Panel: Recent Labs  Lab 01/15/20 1336 01/16/20 0443  NA 134* 136  K 2.7* 3.6  CL 88* 96*  CO2 32 31  GLUCOSE 103* 73  BUN 15 10  CREATININE 0.39* 0.35*  CALCIUM 8.7* 8.4*  MG 2.0  --    Liver Function Tests: Recent Labs  Lab 01/15/20 1336 01/16/20 0443  AST 90* 73*  ALT 73* 62*  ALKPHOS 681* 547*  BILITOT 2.6* 2.6*  PROT 7.7 6.8  ALBUMIN 2.6* 2.2*   Recent Labs  Lab 01/15/20 1336  LIPASE 21   No results for input(s): AMMONIA in the last 168 hours. Coagulation Profile: No results for input(s): INR, PROTIME in the last 168 hours. CBC: Recent Labs  Lab 01/15/20 1336  WBC 14.8*  NEUTROABS 12.4*  HGB 10.2*  HCT 33.1*  MCV 82.1  PLT 586*   Cardiac Enzymes: No results for input(s): CKTOTAL, CKMB, CKMBINDEX, TROPONINI in the last 168 hours. BNP: Invalid input(s): POCBNP CBG: No results for input(s): GLUCAP in the last 168 hours. HbA1C: No results  for input(s): HGBA1C in the last 72 hours. Urine analysis:    Component Value Date/Time   COLORURINE AMBER (A) 01/15/2020 1355   APPEARANCEUR CLEAR 01/15/2020 1355   LABSPEC 1.016 01/15/2020 1355   PHURINE 5.0 01/15/2020 1355   GLUCOSEU NEGATIVE 01/15/2020 1355   HGBUR NEGATIVE 01/15/2020 [place] 01/15/2020 1355   KETONESUR NEGATIVE 01/15/2020 1355   PROTEINUR NEGATIVE 01/15/2020 1355   NITRITE NEGATIVE 01/15/2020 1355   LEUKOCYTESUR NEGATIVE 01/15/2020 1355   Sepsis Labs: $RemoveBefo'@[id number]'uxnRoewRWVP$ (procalcitonin:4,lacticidven:4) ) Recent Results (from the past 240 hour(s))  Resp Panel by RT PCR (RSV, Flu A&B, Covid) - Nasopharyngeal Swab     Status: None   Collection Time: 01/15/20  1:48 PM   Specimen: Nasopharyngeal Swab  Result Value Ref Range Status   SARS Coronavirus 2 by RT PCR NEGATIVE NEGATIVE Final    Comment: (NOTE) SARS-CoV-2  target nucleic acids are NOT DETECTED.  The SARS-CoV-2 RNA is generally detectable in upper respiratoy specimens during the acute phase of infection. The lowest concentration of SARS-CoV-2 viral copies this assay can detect is 131 copies/mL. A negative result does not preclude SARS-Cov-2 infection and should not be used as the sole basis for treatment or other patient management decisions. A negative result may occur with  improper specimen collection/handling, submission of specimen other than nasopharyngeal swab, presence of viral mutation(s) within the areas targeted by this assay, and inadequate number of viral copies (<131 copies/mL). A negative result must be combined with clinical observations, patient history, and epidemiological information. The expected result is Negative.  Fact Sheet for Patients:  PinkCheek.be  Fact Sheet for Healthcare Providers:  GravelBags.it  This test is no t yet approved or cleared by the [place] FDA and  has been authorized for detection and/or diagnosis of SARS-CoV-2 by FDA under an Emergency Use Authorization (EUA). This EUA will remain  in effect (meaning this test can be used) for the duration of the COVID-19 declaration under Section 564(b)(1) of the Act, 21 U.S.C. section 360bbb-3(b)(1), unless the authorization is terminated or revoked sooner.     Influenza A by PCR NEGATIVE NEGATIVE Final   Influenza B by PCR NEGATIVE NEGATIVE Final    Comment: (NOTE) The Xpert Xpress SARS-CoV-2/FLU/RSV assay is intended as an aid in  the diagnosis of influenza from Nasopharyngeal swab specimens and  should not be used as a sole basis for treatment. Nasal washings and  aspirates are unacceptable for Xpert Xpress SARS-CoV-2/FLU/RSV  testing.  Fact Sheet for Patients: PinkCheek.be  Fact Sheet for Healthcare  Providers: GravelBags.it  This test is not yet approved or cleared by the [place] FDA and  has been authorized for detection and/or diagnosis of SARS-CoV-2 by  FDA under an Emergency Use Authorization (EUA). This EUA will remain  in effect (meaning this test can be used) for the duration of the  Covid-19 declaration under Section 564(b)(1) of the Act, 21  U.S.C. section 360bbb-3(b)(1), unless the authorization is  terminated or revoked.    Respiratory Syncytial Virus by PCR NEGATIVE NEGATIVE Final    Comment: (NOTE) Fact Sheet for Patients: PinkCheek.be  Fact Sheet for Healthcare Providers: GravelBags.it  This test is not yet approved or cleared by the [place] FDA and  has been authorized for detection and/or diagnosis of SARS-CoV-2 by  FDA under an Emergency Use Authorization (EUA). This EUA will remain  in effect (meaning this test can be used) for the duration of the  COVID-19 declaration under Section 564(b)(1) of  the Act, 21 U.S.C.  section 360bbb-3(b)(1), unless the authorization is terminated or  revoked. Performed at [facility], [address], [place], [place] [address]      Scheduled Meds:  carvedilol  12.5 mg Oral BID   digoxin  125 mcg Oral Daily   lisinopril  2.5 mg Oral QHS   potassium chloride  40 mEq Oral BID   spironolactone  25 mg Oral Daily   Continuous Infusions:  sodium chloride 100 mL/hr at 01/16/20 6045    Procedures/Studies: CT ABDOMEN PELVIS W CONTRAST  Result Date: 01/15/2020 CLINICAL DATA:  Abdominal pain with 40 lb weight loss over the last 2 months. Abdominal abscess/infection suspected. EXAM: CT ABDOMEN AND PELVIS WITH CONTRAST TECHNIQUE: Multidetector CT imaging of the abdomen and pelvis was performed using the standard protocol following bolus administration of intravenous contrast. CONTRAST:  145mL OMNIPAQUE IOHEXOL 300 MG/ML  SOLN  COMPARISON:  Report only from remote abdominal CT 09/07/2008 and ERCP 09/09/2008. FINDINGS: Lower chest: Mild linear scarring in the right middle lobe. The lung bases are otherwise clear. No significant pleural or pericardial effusion. Aortic and coronary artery atherosclerosis noted. Hepatobiliary: No focal hepatic abnormalities or abnormal enhancement identified. There is moderate intra and extrahepatic biliary dilatation. There is a plastic biliary stent in place. The common hepatic duct appears to measure up to 17 mm in diameter on image 31/2 which is larger than appreciated on ultrasound the same date. There is a possible calculus in the common hepatic duct adjacent to the stent versus stent encrustation. In addition, there is an 11 mm soft tissue nodule at the hepatic hilum on image 27/2 which could be intrabiliary. The gallbladder is moderately distended without wall thickening or surrounding inflammation. Pancreas: Mild diffuse pancreatic ductal dilatation and parenchymal atrophy, previously reported. No focal mass lesion or surrounding inflammation. Spleen: Normal in size without focal abnormality. Adrenals/Urinary Tract: Both adrenal glands appear normal. The kidneys appear normal without evidence of urinary tract calculus, suspicious lesion or hydronephrosis. No bladder abnormalities are seen. Stomach/Bowel: No evidence of bowel wall thickening, distention or surrounding inflammatory change. Vascular/Lymphatic: No enlarged abdominopelvic lymph nodes are identified. Evaluation limited by paucity of intra-abdominal fat. There is diffuse aortic and branch vessel atherosclerosis with severe involvement of the common iliac arteries. No evidence of large vessel occlusion or aneurysm. The portal, superior mesenteric and splenic veins are patent. Reproductive: The prostate gland and seminal vesicles appear normal. Other: Paucity of intra-abdominal fat. No ascites or peritoneal nodularity. Musculoskeletal: No  acute or significant osseous findings. Old left-sided rib fracture in advanced degenerative disc disease at L5-S1 are noted. IMPRESSION: 1. Moderate intra and extrahepatic biliary and gallbladder dilatation suspicious for recurrent biliary obstruction. A plastic biliary stent in place which is likely not functional. There is a possible calculus in the common hepatic duct adjacent to the stent versus stent encrustation. In addition, there is an 11 mm soft tissue nodule at the hepatic hilum which could be intrabiliary. Consider further evaluation with ERCP or MRCP. 2. Pancreatic ductal dilatation, previously reported on remote CT (unavailable). 3. Aortic Atherosclerosis (ICD10-I70.0). Electronically Signed   By: Richardean Sale M.D.   On: 01/15/2020 16:52   US ABDOMEN LIMITED RUQ  Result Date: 01/15/2020 CLINICAL DATA:  Right upper quadrant abdominal pain. History of biliary stenting for stricture in 2010. EXAM: ULTRASOUND ABDOMEN LIMITED RIGHT UPPER QUADRANT COMPARISON:  Report only from abdominal CT 09/07/2008 and ERCP 09/09/2008. Today CT is correlated. FINDINGS: Gallbladder: The sonographer reports a shadowing calculus within the  gallbladder fundus. In correlating the ultrasound images with the nearly concurrent abdominal CT, I suspect this 11 mm calculus is actually within a dilated common hepatic duct. The gallbladder was moderately distended on CT and not clearly imaged on this study. Common bile duct: Diameter: The reported diameter of the common bile duct is 5 mm. However, as discussed above, there is significant intra and extrahepatic biliary dilatation as correlated with CT. The common hepatic duct measures up to 15 mm in diameter. The biliary stent is not well visualized on these images. Intraductal filling defect measuring 11 mm, most consistent with choledocholithiasis. Liver: Mildly increased hepatic echogenicity without focal abnormality. Intrahepatic biliary dilatation. Portal vein is patent on  color Doppler imaging with normal direction of blood flow towards the liver. Other: None. IMPRESSION: 1. This ultrasound, which was performed immediately prior to abdominal CT, does not optimally portray the perceived abnormalities on CT. Combining the 2 studies, there is moderate intra and extrahepatic biliary dilatation in this patient with a biliary stent. There is a probable intraductal filling defect adjacent to the proximal aspect of the stent consistent with choledocholithiasis. 2. See separate CT report. Electronically Signed   By: Richardean Sale M.D.   On: 01/15/2020 17:05    Orson Eva, DO  [facility]  If 7PM-7AM, please contact night-coverage [email or web address] Password [email or web address] 01/16/2020, 8:32 AM   LOS: 1 day

## 2020-01-16 NOTE — OR Nursing (Signed)
Patient awake eating ice chips.

## 2020-01-16 NOTE — Anesthesia Preprocedure Evaluation (Signed)
Anesthesia Evaluation  Patient identified by MRN, date of birth, ID band Patient awake    Reviewed: Allergy & Precautions, H&P , NPO status , Patient's Chart, lab work & pertinent test results, reviewed documented beta blocker date and time   Airway Mallampati: II  TM Distance: >3 FB Neck ROM: full    Dental no notable dental hx.    Pulmonary COPD,  oxygen dependent, Current Smoker,    Pulmonary exam normal breath sounds clear to auscultation       Cardiovascular Exercise Tolerance: Good hypertension, + CAD and +CHF   Rhythm:regular Rate:Normal     Neuro/Psych negative neurological ROS  negative psych ROS   GI/Hepatic negative GI ROS, Neg liver ROS,   Endo/Other  negative endocrine ROS  Renal/GU negative Renal ROS  negative genitourinary   Musculoskeletal   Abdominal   Peds  Hematology negative hematology ROS (+)   Anesthesia Other Findings   Reproductive/Obstetrics negative OB ROS                             Anesthesia Physical Anesthesia Plan  ASA: IV and emergent  Anesthesia Plan: General   Post-op Pain Management:    Induction:   PONV Risk Score and Plan: Propofol infusion  Airway Management Planned:   Additional Equipment:   Intra-op Plan:   Post-operative Plan:   Informed Consent: I have reviewed the patients History and Physical, chart, labs and discussed the procedure including the risks, benefits and alternatives for the proposed anesthesia with the patient or authorized representative who has indicated his/her understanding and acceptance.     Dental Advisory Given  Plan Discussed with: CRNA  Anesthesia Plan Comments:         Anesthesia Quick Evaluation

## 2020-01-16 NOTE — Op Note (Signed)
[facility] Patient Name: Kenneth Coleman Procedure Date: 01/16/2020 10:36 AM MRN: [id number] Date of Birth: 06-10-1955 Attending MD: Maylon Peppers ,  CSN: [id number] Age: 64 Admit Type: Inpatient Procedure:                Upper GI endoscopy Indications:              Dysphagia Providers:                Maylon Peppers, Jeralyn Bennett RN, RN, Lurline Del, RN Referring MD:              Medicines:                Lidocaine jelly. Complications:            No immediate complications. Estimated Blood Loss:     Estimated blood loss: none. Procedure:                Pre-Anesthesia Assessment:                           - Prior to the procedure, a History and Physical                            was performed, and patient medications, allergies                            and sensitivities were reviewed. The patient's                            tolerance of previous anesthesia was reviewed.                           - The risks and benefits of the procedure and the                            sedation options and risks were discussed with the                            patient. All questions were answered and informed                            consent was obtained.                           - ASA Grade Assessment: IV - A patient with severe                            systemic disease that is a constant threat to life.                           After obtaining informed consent, the endoscope was                            passed under direct vision. Throughout  the                            procedure, the patient's blood pressure, pulse, and                            oxygen saturations were monitored continuously. The                            [id number]-[id number] ([id number]) scope was introduced through                            the mouth, and advanced to the second part of                            duodenum. The upper GI endoscopy was performed with                             difficulty due to the patient's oxygen                            desaturation. The patient tolerated the procedure                            well. Scope In: 10:59:15 AM Scope Out: 11:12:54 AM Total Procedure Duration: 0 hours 13 minutes 39 seconds  Findings:      Scattered mucosal alterations characterized by a decreased vascular       pattern with areas of white plaques and pallor were found in the upper       third of the esophagus, between 20-25 cm from incisors. No strictures or       masses were observed in the esophag Imaging was performed using white       light and narrow band imaging to visualize the mucosa. Biopsies were       taken at 20, 25 and 37 cm from incisors with a cold forceps for       histology.      A 4 cm hiatal hernia was present.      The entire examined stomach was normal.      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 Impression:               - Esophageal mucosal variant. Biopsied.                           - 4 cm hiatal hernia.                           - Normal stomach.                           - Two non-bleeding angiodysplastic lesions in the                            duodenum. Moderate Sedation:      Only topical anesthesia was given, patient remained unsedated Recommendation:           -  Return patient to hospital ward for ongoing care.                           - Clear liquid diet.                           - Perform barium esophagram today.                           - Await pathology results.                           - Consideration for PTC by IR/ERCP at tertiary                            center as patient presents severe desaturation                            while awake on 4L oxygen. Procedure Code(s):        --- Professional ---                           [phone], GC, Esophagogastroduodenoscopy, flexible,                            transoral; with biopsy, single or multiple Diagnosis  Code(s):        --- Professional ---                           K22.8, Other specified diseases of esophagus                           K44.9, Diaphragmatic hernia without obstruction or                            gangrene                           K31.819, Angiodysplasia of stomach and duodenum                            without bleeding                           R13.10, Dysphagia, unspecified CPT copyright 2019 American Medical Association. All rights reserved. The codes documented in this report are preliminary and upon coder review may  be revised to meet current compliance requirements. Maylon Peppers, MD Maylon Peppers,  01/16/2020 1:10:25 PM This report has been signed electronically. Number of Addenda: 0

## 2020-01-16 NOTE — Transfer of Care (Signed)
Immediate Anesthesia Transfer of Care Note  Patient: Kenneth Coleman  Procedure(s) Performed: ESOPHAGOGASTRODUODENOSCOPY (EGD) WITH PROPOFOL (N/A ) BIOPSY  Patient Location: PACU  Anesthesia Type:MAC  Level of Consciousness: awake  Airway & Oxygen Therapy: Patient Spontanous Breathing  Post-op Assessment: Report given to RN  Post vital signs: Reviewed and stable  Last Vitals:  Vitals Value Taken Time  BP    Temp    Pulse    Resp 20 01/16/20 1119  SpO2    Vitals shown include unvalidated device data.  Last Pain:  Vitals:   01/16/20 0806  TempSrc:   PainSc: 0-No pain         Complications: No complications documented.

## 2020-01-16 NOTE — Progress Notes (Signed)
Patient denies having any complaints.  Denies having any nausea or vomiting but he reports having persistent epigastric and right upper quadrant abdominal pain.  Has not been able to swallow anything during the night.  Is currently on IV hydration.  Most recent CMP showed improvement in his potassium 3.6. Pt is HD stable.  We will proceed with EGD as scheduled.  I thoroughly discussed with the patient his procedure, including the risks involved. Patient understands what the procedure involves including the benefits and any risks. Patient understands alternatives to the proposed procedure. Risks including (but not limited to) bleeding, tearing of the lining (perforation), rupture of adjacent organs, problems with heart and lung function, infection, and medication reactions. A small percentage of complications may require surgery, hospitalization, repeat endoscopic procedure, and/or transfusion.  Patient understood and agreed.  Maylon Peppers, MD Gastroenterology and Hepatology [facility]

## 2020-01-17 ENCOUNTER — Inpatient Hospital Stay (HOSPITAL_COMMUNITY): Payer: Medicare Other

## 2020-01-17 DIAGNOSIS — J181 Lobar pneumonia, unspecified organism: Secondary | ICD-10-CM | POA: Diagnosis not present

## 2020-01-17 DIAGNOSIS — I5021 Acute systolic (congestive) heart failure: Secondary | ICD-10-CM | POA: Diagnosis not present

## 2020-01-17 DIAGNOSIS — K8050 Calculus of bile duct without cholangitis or cholecystitis without obstruction: Secondary | ICD-10-CM | POA: Diagnosis not present

## 2020-01-17 DIAGNOSIS — J9621 Acute and chronic respiratory failure with hypoxia: Secondary | ICD-10-CM | POA: Diagnosis not present

## 2020-01-17 DIAGNOSIS — E876 Hypokalemia: Secondary | ICD-10-CM | POA: Diagnosis not present

## 2020-01-17 LAB — COMPREHENSIVE METABOLIC PANEL
ALT: 56 U/L — ABNORMAL HIGH (ref 0–44)
AST: 63 U/L — ABNORMAL HIGH (ref 15–41)
Albumin: 2.1 g/dL — ABNORMAL LOW (ref 3.5–5.0)
Alkaline Phosphatase: 522 U/L — ABNORMAL HIGH (ref 38–126)
Anion gap: 6 (ref 5–15)
BUN: 7 mg/dL — ABNORMAL LOW (ref 8–23)
CO2: 30 mmol/L (ref 22–32)
Calcium: 8.3 mg/dL — ABNORMAL LOW (ref 8.9–10.3)
Chloride: 97 mmol/L — ABNORMAL LOW (ref 98–111)
Creatinine, Ser: 0.33 mg/dL — ABNORMAL LOW (ref 0.61–1.24)
GFR, Estimated: >60 mL/min (ref >60)
Glucose, Bld: 97 mg/dL (ref 70–99)
Potassium: 3.9 mmol/L (ref 3.5–5.1)
Sodium: 133 mmol/L — ABNORMAL LOW (ref 135–145)
Total Bilirubin: 2.1 mg/dL — ABNORMAL HIGH (ref 0.3–1.2)
Total Protein: 6.6 g/dL (ref 6.5–8.1)

## 2020-01-17 LAB — ECHOCARDIOGRAM COMPLETE
Area-P 1/2: 3.19 cm2
Height: 69 in
P 1/2 time: 691 msec
S' Lateral: 3.13 cm
Weight: 2480 oz

## 2020-01-17 LAB — MRSA PCR SCREENING: MRSA by PCR: POSITIVE — AB

## 2020-01-17 MED ORDER — MUPIROCIN 2 % EX OINT
1.0000 "application " | TOPICAL_OINTMENT | Freq: Two times a day (BID) | CUTANEOUS | Status: AC
  Administered 2020-01-17 – 2020-01-22 (×9): 1 via NASAL
  Filled 2020-01-17 (×3): qty 22

## 2020-01-17 MED ORDER — CHLORHEXIDINE GLUCONATE CLOTH 2 % EX PADS
6.0000 | MEDICATED_PAD | Freq: Every day | CUTANEOUS | Status: AC
  Administered 2020-01-18 – 2020-01-20 (×2): 6 via TOPICAL

## 2020-01-17 MED ORDER — SODIUM CHLORIDE 0.9 % IV SOLN
2.0000 g | Freq: Three times a day (TID) | INTRAVENOUS | Status: DC
  Administered 2020-01-17 – 2020-01-20 (×9): 2 g via INTRAVENOUS
  Filled 2020-01-17 (×11): qty 2

## 2020-01-17 MED ORDER — METRONIDAZOLE IN NACL 5-0.79 MG/ML-% IV SOLN
500.0000 mg | Freq: Three times a day (TID) | INTRAVENOUS | Status: DC
  Administered 2020-01-17 – 2020-01-20 (×9): 500 mg via INTRAVENOUS
  Filled 2020-01-17 (×9): qty 100

## 2020-01-17 MED ORDER — ENSURE ENLIVE PO LIQD
237.0000 mL | Freq: Three times a day (TID) | ORAL | Status: DC
  Administered 2020-01-17 – 2020-01-20 (×8): 237 mL via ORAL

## 2020-01-17 NOTE — Progress Notes (Signed)
  Echocardiogram 2D Echocardiogram has been performed.  Jennette Dubin 01/17/2020, 12:59 PM

## 2020-01-17 NOTE — Progress Notes (Signed)
Kenneth Coleman, M.D. Gastroenterology & Hepatology   Interval History:  No acute events overnight. Patient underwent unsedated EGD yesterday given desaturation on oxygen supplementation by nasal cannula, spaghetti scope was passed with visualization of localized whitish plaques in the proximal esophagus without presence of any strictures or narrowing in the esophagus. These were biopsied. 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 Underwent neck and chest CT which showed nonspecific thickening of the proximal esophagus without presence of lymphadenopathy or mass. Patient states that he was able to tolerate clear liquids yesterday and he wants to try eating more food today. Denies any nausea or vomiting. States that his abdominal pain has been controlled with pain medication.  Inpatient Medications:  Current Facility-Administered Medications:  .  0.9 %  sodium chloride infusion, , Intravenous, Continuous, Tat, Idrees, MD, Last Rate: 50 mL/hr at 01/16/20 1459, New Bag at 01/16/20 1459 .  budesonide (PULMICORT) nebulizer solution 0.5 mg, 0.5 mg, Nebulization, BID, Tat, Hobert, MD, 0.5 mg at 01/17/20 0834 .  carvedilol (COREG) tablet 6.25 mg, 6.25 mg, Oral, BID, Tat, Arliss, MD, 6.25 mg at 01/17/20 0823 .  ceFEPIme (MAXIPIME) 1 g in sodium chloride 0.9 % 100 mL IVPB, 1 g, Intravenous, Q8H, Tat, Isao, MD, Last Rate: 200 mL/hr at 01/17/20 0705, 1 g at 01/17/20 0705 .  digoxin (LANOXIN) tablet 125 mcg, 125 mcg, Oral, Daily, [name], DO, 125 mcg at 01/16/20 1457 .  feeding supplement (ENSURE ENLIVE) (ENSURE ENLIVE) liquid 237 mL, 237 mL, Oral, TID BM, Montez Morita, Kashia Brossard, MD .  HYDROmorphone (DILAUDID) injection 0.5 mg, 0.5 mg, Intravenous, Q2H PRN, [name], DO, 0.5 mg at 01/17/20 0825 .  ipratropium-albuterol (DUONEB) 0.5-2.5 (3) MG/3ML nebulizer solution 3 mL, 3 mL, Nebulization, Q6H, Tat, Jafet, MD, 3 mL at 01/17/20 0834 .   ondansetron (ZOFRAN) tablet 4 mg, 4 mg, Oral, Q6H PRN **OR** ondansetron (ZOFRAN) injection 4 mg, 4 mg, Intravenous, Q6H PRN, Stinson, Jacob J, DO .  potassium chloride SA (KLOR-CON) CR tablet 40 mEq, 40 mEq, Oral, BID, [name], DO, 40 mEq at 01/17/20 5597 .  spironolactone (ALDACTONE) tablet 25 mg, 25 mg, Oral, Daily, [name], DO, 25 mg at 01/17/20 4163   I/O    Intake/Output Summary (Last 24 hours) at 01/17/2020 1049 Last data filed at 01/17/2020 0300 Gross per 24 hour  Intake 1266.3 ml  Output --  Net 1266.3 ml     Physical Exam: Temp:  [97.8 F (36.6 C)-98.3 F (36.8 C)] 98.2 F (36.8 C) (10/10 0751) Pulse Rate:  [56-81] 58 (10/10 0751) Resp:  [13-23] 18 (10/10 0751) BP: (104-132)/(51-75) 125/58 (10/10 0751) SpO2:  [89 %-100 %] 93 % (10/10 0834)  Temp (24hrs), Avg:98.1 F (36.7 C), Min:97.8 F (36.6 C), Max:98.3 F (36.8 C) GENERAL: The patient is AO x3, in no acute distress. Malnourished. HEENT: Head is normocephalic and atraumatic. EOMI are intact. Mouth is well hydrated and without lesions. Has temporal wasting. NECK: Supple. No masses LUNGS: Has presence of mild wheezing in both lung fields. Decreased breath sounds in both bases. HEART: RRR, normal s1 and s2. ABDOMEN: tender to palpation in the epigastric region, no guarding, no peritoneal signs, and nondistended. BS +. No masses. EXTREMITIES: Without any cyanosis, clubbing, rash, lesions or edema. NEUROLOGIC: AOx3, no focal motor deficit. SKIN: no jaundice, no rashes  Laboratory Data: CBC:     Component Value Date/Time   WBC 19.6 (H) 01/16/2020 1752  RBC 3.44 (L) 01/16/2020 1752   HGB 8.8 (L) 01/16/2020 1752   HCT 29.2 (L) 01/16/2020 1752   PLT 476 (H) 01/16/2020 1752   MCV 84.9 01/16/2020 1752   MCH 25.6 (L) 01/16/2020 1752   MCHC 30.1 01/16/2020 1752   RDW 15.4 01/16/2020 1752   LYMPHSABS 1.5 01/15/2020 1336   MONOABS 0.9 01/15/2020 1336   EOSABS 0.0 01/15/2020 1336   BASOSABS 0.0  01/15/2020 1336   COAG: No results found for: INR, PROTIME  BMP:  BMP Latest Ref Rng & Units 01/17/2020 01/16/2020 01/15/2020  Glucose 70 - 99 mg/dL 97 73 103(H)  BUN 8 - 23 mg/dL 7(L) 10 15  Creatinine 0.61 - 1.24 mg/dL 0.33(L) 0.35(L) 0.39(L)  Sodium 135 - 145 mmol/L 133(L) 136 134(L)  Potassium 3.5 - 5.1 mmol/L 3.9 3.6 2.7(LL)  Chloride 98 - 111 mmol/L 97(L) 96(L) 88(L)  CO2 22 - 32 mmol/L 30 31 32  Calcium 8.9 - 10.3 mg/dL 8.3(L) 8.4(L) 8.7(L)    HEPATIC:  Hepatic Function Latest Ref Rng & Units 01/17/2020 01/16/2020 01/15/2020  Total Protein 6.5 - 8.1 g/dL 6.6 6.8 7.7  Albumin 3.5 - 5.0 g/dL 2.1(L) 2.2(L) 2.6(L)  AST 15 - 41 U/L 63(H) 73(H) 90(H)  ALT 0 - 44 U/L 56(H) 62(H) 73(H)  Alk Phosphatase 38 - 126 U/L 522(H) 547(H) 681(H)  Total Bilirubin 0.3 - 1.2 mg/dL 2.1(H) 2.6(H) 2.6(H)    CARDIAC: No results found for: CKTOTAL, CKMB, CKMBINDEX, TROPONINI    Imaging: I personally reviewed and interpreted the available labs, imaging and endoscopic files.   Assessment/Plan: Kenneth Coleman is a 64 y.o. male with history of COPD on home oxygen, CHF, HTN, coronary artery disease, hypertension, who comes to the hospital for evaluation of dysphagia and choledocholithiasis.  The patient has presented failure to thrive severe dysphagia that has limited his food intake and has led to major weight loss.   Patient underwent EGD without sedation yesterday for evaluation of dysphagia with profound weight loss, however given desaturation on oxygen supplementation by nasal cannula, spaghetti scope was passed with visualization of localized whitish plaques in the proximal esophagus without presence of any strictures or narrowing in the esophagus. These were biopsied. 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 Repeat CT of the neck at [facility] did not show any masses.  Biopsies were taken from the esophageal thickening area.  At this point, the patient will require to  undergo an ERCP and/or possible PTC by IR given the presence of choledocholithiasis and possible stent encrustation requiring biliary drainage.  Due to the high likelihood that the patient will remain intubated after the ERCP and the possible need for interventional radiology to facilitate drainage, discussion was held with GI on-call Dr.Armbruster at [facility] to transfer patient for comprehensive care by gastroenterology and possibly interventional radiology.  # Dysphagia # Esophageal thickening # Choledocholithiasis vs stent encrustation - Transfer to [facility] for ERCP +/- PTC drainage - Daily CBC and CMP - c/w Flagyl 500 mg IV and ciprofloxacin 400 mg BID IV - Follow up pathology   Harvel Quale, MD Gastroenterology and Hepatology [facility]  Note: Occasional unusual wording and randomly placed punctuation marks may result from the use of speech recognition technology to transcribe this document

## 2020-01-17 NOTE — Progress Notes (Signed)
Report given to Reeves at [facility] at [phone]. No further questions at this time. Awaiting [facility] transporation.

## 2020-01-17 NOTE — Progress Notes (Signed)
Report given to [facility] with [facility]. No further questions at this time. [facility] is in route and has an ETA of 20 minutes. Pt. Aware.

## 2020-01-17 NOTE — Progress Notes (Addendum)
PROGRESS NOTE  Kenneth Coleman ZTI:[id number] DOB: 06/08/1955 DOA: 01/15/2020 PCP: System, Provider Not In  Brief History:  64 year old male with a history of COPD, chronic respiratory failure on 2.5 L, hypertension, coronary disease, CHF presenting with dysphagia, abdominal pain, weight loss, and generalized weakness.  The patient is a poor historian.  Most of the information is obtained from review of medical records.  The patient has had progressive dysphagia to solid food over the last 2 months.  He has had decreased oral intake.  He has been drinking Ensure as much as possible.  He endorses a 40 pound weight loss over the past few months.  In addition, he has been having intermittent episodes of epigastric and right upper quadrant pain exacerbated by eating.  He denies any actual vomiting, nausea, fevers, chills, abdominal distention, hematochezia, melena, dysuria, hematuria.  He is having normal bowel movements.  Apparently, the patient was recently admitted to [facility] in [place] and [place] where he had a 5-day hospital stay.  A CT of the abdomen and pelvis at that time showed dilated intrahepatic and extrahepatic biliary ducts secondary to choledocholithiasis.  There was no on-call gastroenterology at that facility.  The patient was ultimately discharged from the hospital with instructions for GI follow-up.  He did not follow-up with GI. Because of abdominal pain, he went to [facility] emergency department on 01/13/2020.  They recommended transfer to a tertiary care facility.  However, the patient left AGAINST MEDICAL ADVICE.  Nevertheless, he continued to have dysphagia, generalized weakness, and intermittent abdominal pain.  As result, he presented to [facility] for further evaluation. Imaging at [facility] also showed circumferential wall thickening of the proximal esophagus concerning for esophagitis or infiltrative process.  There is also concern for a periapical lucency and area of bony  destruction in the left maxilla. In the emergency department, the patient had oxygen saturation 88 to 91% on 6 L.  He was afebrile with soft blood pressures.  GI was consulted and is planning for endoscopy.  BMP showed potassium 2.7.  AST 90, ALT 73, alk phosphatase 681, total bilirubin 2.6, lipase 21.  WBC 14.8, hemoglobin 10.2, platelets 5 86,000.  CT of the abdomen and pelvis showed moderate intrahepatic and extrahepatic biliary ductal dilatation.  There was a plastic biliary stent in place.  There is possible calculus in the common hepatic duct adjacent to the stent.  There is also pancreatic ductal dilatation.  Right upper quadrant ultrasound showed moderate intrahepatic and extrahepatic biliary duct dilatation with biliary stent.  There is also a probable intraductal filling defect adjacent to the proximal aspect of the stent consistent with choledocholithiasis.  Assessment/Plan:  Dysphagia/esophageal thickening -12/10/19 CT H&N--showed proximal esophageal thickening -concerned about malignancy or other infiltrative process -appreciate GI -EGD 10/9--No strictures or narrowing in the esophagus. These were biopsied. There was also presence of a hiatal hernia and 2 nonbleeding AVMs in the duodenal bulb -start PPI  Acute on chronic respiratory failure with hypoxia -normally on 2.5L at home  -now on 4L with saturation 88-91% -likely due to PNA in setting of underlying COPD -no distress or increase WOB -COVID negative -D-dimer--0.84 -check PCT--1.73 -continue duonebs -continue pulmicort -CT chest--Multifocal endobronchial debris and mucus plugging most with new heterogeneous opacification of the LEFT lower lobe.  Centrilobular nodularity in the LEFT upper lobe and RIGHT lower lobe, likely infectious or inflammatory.  Choledocholithiasis -12/11/19 CT at [facility] initially noted -01/15/20 CT abd/pelvis--as discussed above -  01/15/20 RUQ [place] as above -case discussed with GI--Dr.  Eldred Manges CHF, COPD, respiratory failure-->transfer to [facility] for ERCP vs PTC by IR--he spoke with [facility] who will see patient in consult  Lobar Pneumonia -HCAP/ASpiration -continue cefepime -add metronidazole  Chronic osteomyelitis of left Maxilla -12/10/19 CT head and neck with periapical bony reabsorption/bony destruction with lucency and empty socket in lext  Maxilla posterior to 2 remaining teeth  Chronic diastolic CHF -continue coreg at lower dose due to soft BPs -obtain Echo--EF 55-60, no WMA, G1DD -appears clinically euvolemic -suspect component of systolic CHF in past given use of digoxin, lisinopril and spironolactone -continue spiro as BP allows -clinically euvolemic presently  Tobacco abuse -continues to smoke -at least 30 pk year hx  Failure to Thrive -B12--428 -TSH--1.072 -folate--10.4 -will need PT eval once stable  Hypokalemia -repleted -check mag--2.0  Coronary Artery Disease -no chest pain presently    Status is: Inpatient  Remains inpatient appropriate because:IV treatments appropriate due to intensity of illness or inability to take PO   Dispo: The patient is from: Home              Anticipated d/c is to: Home              Anticipated d/c date is: 2 days              Patient currently is not medically stable to d/c.        Family Communication:   No Family at bedside  Consultants:  GI  Code Status:  FULL   DVT Prophylaxis:  SCDs   Procedures: As Listed in Progress Note Above  Antibiotics: Cefepime 10/9>>> Metronidazole 10/10>>     Subjective: Patient complains of epigastric pain.  Denies f/c, cp, n/v/d  Objective: Vitals:   01/17/20 0419 01/17/20 0751 01/17/20 0834 01/17/20 1100  BP: 113/65 (!) 125/58    Pulse: 69 (!) 58    Resp: 18 18    Temp: 98.2 F (36.8 C) 98.2 F (36.8 C)    TempSrc: Oral Oral    SpO2: 90% 93% 93%   Weight:    70.3 kg  Height:    '5\' 9"'$  (1.753 m)    Intake/Output Summary  (Last 24 hours) at 01/17/2020 1414 Last data filed at 01/17/2020 0300 Gross per 24 hour  Intake 1266.3 ml  Output --  Net 1266.3 ml   Weight change:  Exam:   General:  Pt is alert, follows commands appropriately, not in acute distress  HEENT: No icterus, No thrush, No neck mass, [place]/AT  Cardiovascular: RRR, S1/S2, no rubs, no gallops  Respiratory:diminished BS, bibasilar rales.  Abdomen: Soft/+BS, epigastric tender, non distended, no guarding  Extremities: No edema, No lymphangitis, No petechiae, No rashes, no synovitis   Data Reviewed: I have personally reviewed following labs and imaging studies Basic Metabolic Panel: Recent Labs  Lab 01/15/20 1336 01/16/20 0443 01/17/20 0605  NA 134* 136 133*  K 2.7* 3.6 3.9  CL 88* 96* 97*  CO2 32 31 30  GLUCOSE 103* 73 97  BUN 15 10 7*  CREATININE 0.39* 0.35* 0.33*  CALCIUM 8.7* 8.4* 8.3*  MG 2.0  --   --    Liver Function Tests: Recent Labs  Lab 01/15/20 1336 01/16/20 0443 01/17/20 0605  AST 90* 73* 63*  ALT 73* 62* 56*  ALKPHOS 681* 547* 522*  BILITOT 2.6* 2.6* 2.1*  PROT 7.7 6.8 6.6  ALBUMIN 2.6* 2.2* 2.1*   Recent Labs  Lab 01/15/20 1336  LIPASE 21   No results for input(s): AMMONIA in the last 168 hours. Coagulation Profile: No results for input(s): INR, PROTIME in the last 168 hours. CBC: Recent Labs  Lab 01/15/20 1336 01/16/20 1752  WBC 14.8* 19.6*  NEUTROABS 12.4*  --   HGB 10.2* 8.8*  HCT 33.1* 29.2*  MCV 82.1 84.9  PLT 586* 476*   Cardiac Enzymes: No results for input(s): CKTOTAL, CKMB, CKMBINDEX, TROPONINI in the last 168 hours. BNP: Invalid input(s): POCBNP CBG: No results for input(s): GLUCAP in the last 168 hours. HbA1C: No results for input(s): HGBA1C in the last 72 hours. Urine analysis:    Component Value Date/Time   COLORURINE AMBER (A) 01/15/2020 1355   APPEARANCEUR CLEAR 01/15/2020 1355   LABSPEC 1.016 01/15/2020 1355   PHURINE 5.0 01/15/2020 1355   GLUCOSEU NEGATIVE  01/15/2020 1355   HGBUR NEGATIVE 01/15/2020 [place] 01/15/2020 1355   KETONESUR NEGATIVE 01/15/2020 1355   PROTEINUR NEGATIVE 01/15/2020 1355   NITRITE NEGATIVE 01/15/2020 1355   LEUKOCYTESUR NEGATIVE 01/15/2020 1355   Sepsis Labs: $[id number]'[id number]'[id number]$ (procalcitonin:4,lacticidven:4) ) Recent Results (from the past 240 hour(s))  Resp Panel by RT PCR (RSV, Flu A&B, Covid) - Nasopharyngeal Swab     Status: None   Collection Time: 01/15/20  1:48 PM   Specimen: Nasopharyngeal Swab  Result Value Ref Range Status   SARS Coronavirus 2 by RT PCR NEGATIVE NEGATIVE Final    Comment: (NOTE) SARS-CoV-2 target nucleic acids are NOT DETECTED.  The SARS-CoV-2 RNA is generally detectable in upper respiratoy specimens during the acute phase of infection. The lowest concentration of SARS-CoV-2 viral copies this assay can detect is 131 copies/mL. A negative result does not preclude SARS-Cov-2 infection and should not be used as the sole basis for treatment or other patient management decisions. A negative result may occur with  improper specimen collection/handling, submission of specimen other than nasopharyngeal swab, presence of viral mutation(s) within the areas targeted by this assay, and inadequate number of viral copies (<131 copies/mL). A negative result must be combined with clinical observations, patient history, and epidemiological information. The expected result is Negative.  Fact Sheet for Patients:  PinkCheek.be  Fact Sheet for Healthcare Providers:  GravelBags.it  This test is no t yet approved or cleared by the [place] FDA and  has been authorized for detection and/or diagnosis of SARS-CoV-2 by FDA under an Emergency Use Authorization (EUA). This EUA will remain  in effect (meaning this test can be used) for the duration of the COVID-19 declaration under Section 564(b)(1) of the Act, 21 U.S.C. section  360bbb-3(b)(1), unless the authorization is terminated or revoked sooner.     Influenza A by PCR NEGATIVE NEGATIVE Final   Influenza B by PCR NEGATIVE NEGATIVE Final    Comment: (NOTE) The Xpert Xpress SARS-CoV-2/FLU/RSV assay is intended as an aid in  the diagnosis of influenza from Nasopharyngeal swab specimens and  should not be used as a sole basis for treatment. Nasal washings and  aspirates are unacceptable for Xpert Xpress SARS-CoV-2/FLU/RSV  testing.  Fact Sheet for Patients: PinkCheek.be  Fact Sheet for Healthcare Providers: GravelBags.it  This test is not yet approved or cleared by the [place] FDA and  has been authorized for detection and/or diagnosis of SARS-CoV-2 by  FDA under an Emergency Use Authorization (EUA). This EUA will remain  in effect (meaning this test can be used) for the duration of the  Covid-19 declaration under Section 564(b)(1) of the Act, 21  U.S.C.  section 360bbb-3(b)(1), unless the authorization is  terminated or revoked.    Respiratory Syncytial Virus by PCR NEGATIVE NEGATIVE Final    Comment: (NOTE) Fact Sheet for Patients: PinkCheek.be  Fact Sheet for Healthcare Providers: GravelBags.it  This test is not yet approved or cleared by the [place] FDA and  has been authorized for detection and/or diagnosis of SARS-CoV-2 by  FDA under an Emergency Use Authorization (EUA). This EUA will remain  in effect (meaning this test can be used) for the duration of the  COVID-19 declaration under Section 564(b)(1) of the Act, 21 U.S.C.  section 360bbb-3(b)(1), unless the authorization is terminated or  revoked. Performed at [facility], [address], [place], [place] [address]      Scheduled Meds: . budesonide (PULMICORT) nebulizer solution  0.5 mg Nebulization BID  . carvedilol  6.25 mg Oral BID  . digoxin  125 mcg Oral  Daily  . feeding supplement (ENSURE ENLIVE)  237 mL Oral TID BM  . ipratropium-albuterol  3 mL Nebulization Q6H  . potassium chloride  40 mEq Oral BID  . spironolactone  25 mg Oral Daily   Continuous Infusions: . sodium chloride 50 mL/hr at 01/16/20 1459  . ceFEPime (MAXIPIME) IV    . metronidazole      Procedures/Studies: CT SOFT TISSUE NECK W CONTRAST  Result Date: 01/16/2020 CLINICAL DATA:  Esophageal mass. EXAM: CT NECK WITH CONTRAST TECHNIQUE: Multidetector CT imaging of the neck was performed using the standard protocol following the bolus administration of intravenous contrast. CONTRAST:  17mL OMNIPAQUE IOHEXOL 300 MG/ML  SOLN COMPARISON:  CT chest scratched at CT neck with contrast 12/21/2012 FINDINGS: Pharynx and larynx: No focal mucosal or submucosal lesions are present. Nasopharynx is clear. Soft palate and tongue base are normal. Epiglottis is normal. The hypopharynx is clear. Vocal cords are midline and symmetric. The trachea is unremarkable. Salivary glands: The submandibular and parotid glands and ducts are within normal limits. Thyroid: Normal Lymph nodes: No significant cervical adenopathy is present. Vascular: Atherosclerotic changes are noted at the aortic arch great vessel origins the carotid bifurcations bilaterally. Moderate to high-grade stenosis is present on the right. No significant stenosis is present on the left. Limited intracranial: Within normal limits. Visualized orbits: The globes and orbits are within normal limits. Mastoids and visualized paranasal sinuses: Chronic right sphenoid sinus opacification is present. The paranasal sinuses and mastoid air cells are otherwise clear. Skeleton: Multilevel degenerative changes are present cervical spine. Slight degenerative anterolisthesis present at C3-4 associated with uncovertebral spurring and foraminal narrowing bilaterally. Foraminal narrowing is present in the lower cervical spine as well. No focal lytic or blastic  lesions are present. Dental caries are present within the residual teeth. Upper chest: The lung apices are clear. Thoracic inlet is normal. There is some thickening of the esophagus without a discrete mass. IMPRESSION: 1. Mild thickening of the upper esophagus without a discrete mass. No pneumomediastinum. 2. No focal mucosal or submucosal lesions. 3. No significant cervical adenopathy. 4. Moderate to high-grade stenosis of the proximal right internal carotid artery. 5. Multilevel degenerative changes of the cervical spine as described. 6. Dental caries. 7. Chronic right sphenoid sinus disease. 8. Aortic Atherosclerosis (ICD10-I70.0). Electronically Signed   By: San Morelle M.D.   On: 01/16/2020 19:36   CT CHEST W CONTRAST  Result Date: 01/16/2020 CLINICAL DATA:  Dysphagia and weight loss EXAM: CT CHEST WITH CONTRAST TECHNIQUE: Multidetector CT imaging of the chest was performed during intravenous contrast administration. CONTRAST:  53mL OMNIPAQUE  IOHEXOL 300 MG/ML  SOLN COMPARISON:  October 8th 2011 FINDINGS: Cardiovascular: No significant vascular findings. No central pulmonary embolism. Normal heart size. No pericardial effusion. Atherosclerotic calcifications of the aorta. Mediastinum/Nodes: No enlarged mediastinal, hilar, or axillary lymph nodes. Thyroid gland, and esophagus demonstrate no significant findings. Lungs/Pleura: Multifocal endobronchial debris and mucus plugging most predominant in the LEFT lower lobe. There is heterogeneous opacification of the LEFT lower lobe, new since prior. Mild centrilobular nodularity in the RIGHT lower lobe. Mild to moderate centrilobular emphysema. Centrilobular nodularity in the LEFT upper lobe with a representative more focal nodule measuring 4 mm (series 5, image 33). Subpleural nodule of the RIGHT upper lobe measures 3 mm (series 5, image 88). Irregular nodule along the RIGHT paramediastinal border measures 4 mm (series 5, image 102). Upper Abdomen:  Revisualization of moderate intrahepatic biliary ductal dilation, unchanged in comparison to prior. Musculoskeletal: Gynecomastia.  No acute osseous abnormality. IMPRESSION: 1. Multifocal endobronchial debris and mucus plugging most with new heterogeneous opacification of the LEFT lower lobe. Findings are concerning for aspiration and aspiration pneumonitis. 2. Centrilobular nodularity in the LEFT upper lobe and RIGHT lower lobe, likely infectious or inflammatory. 3. Scattered pulmonary nodules measuring up to 4 mm. Recommend follow-up CT in 1 year to assess for stability. Aortic Atherosclerosis (ICD10-I70.0) and Emphysema (ICD10-J43.9). Electronically Signed   By: Valentino Saxon MD   On: 01/16/2020 19:13   CT ABDOMEN PELVIS W CONTRAST  Result Date: 01/15/2020 CLINICAL DATA:  Abdominal pain with 40 lb weight loss over the last 2 months. Abdominal abscess/infection suspected. EXAM: CT ABDOMEN AND PELVIS WITH CONTRAST TECHNIQUE: Multidetector CT imaging of the abdomen and pelvis was performed using the standard protocol following bolus administration of intravenous contrast. CONTRAST:  134mL OMNIPAQUE IOHEXOL 300 MG/ML  SOLN COMPARISON:  Report only from remote abdominal CT 09/07/2008 and ERCP 09/09/2008. FINDINGS: Lower chest: Mild linear scarring in the right middle lobe. The lung bases are otherwise clear. No significant pleural or pericardial effusion. Aortic and coronary artery atherosclerosis noted. Hepatobiliary: No focal hepatic abnormalities or abnormal enhancement identified. There is moderate intra and extrahepatic biliary dilatation. There is a plastic biliary stent in place. The common hepatic duct appears to measure up to 17 mm in diameter on image 31/2 which is larger than appreciated on ultrasound the same date. There is a possible calculus in the common hepatic duct adjacent to the stent versus stent encrustation. In addition, there is an 11 mm soft tissue nodule at the hepatic hilum on image  27/2 which could be intrabiliary. The gallbladder is moderately distended without wall thickening or surrounding inflammation. Pancreas: Mild diffuse pancreatic ductal dilatation and parenchymal atrophy, previously reported. No focal mass lesion or surrounding inflammation. Spleen: Normal in size without focal abnormality. Adrenals/Urinary Tract: Both adrenal glands appear normal. The kidneys appear normal without evidence of urinary tract calculus, suspicious lesion or hydronephrosis. No bladder abnormalities are seen. Stomach/Bowel: No evidence of bowel wall thickening, distention or surrounding inflammatory change. Vascular/Lymphatic: No enlarged abdominopelvic lymph nodes are identified. Evaluation limited by paucity of intra-abdominal fat. There is diffuse aortic and branch vessel atherosclerosis with severe involvement of the common iliac arteries. No evidence of large vessel occlusion or aneurysm. The portal, superior mesenteric and splenic veins are patent. Reproductive: The prostate gland and seminal vesicles appear normal. Other: Paucity of intra-abdominal fat. No ascites or peritoneal nodularity. Musculoskeletal: No acute or significant osseous findings. Old left-sided rib fracture in advanced degenerative disc disease at L5-S1 are noted. IMPRESSION: 1. Moderate intra  and extrahepatic biliary and gallbladder dilatation suspicious for recurrent biliary obstruction. A plastic biliary stent in place which is likely not functional. There is a possible calculus in the common hepatic duct adjacent to the stent versus stent encrustation. In addition, there is an 11 mm soft tissue nodule at the hepatic hilum which could be intrabiliary. Consider further evaluation with ERCP or MRCP. 2. Pancreatic ductal dilatation, previously reported on remote CT (unavailable). 3. Aortic Atherosclerosis (ICD10-I70.0). Electronically Signed   By: Richardean Sale M.D.   On: 01/15/2020 16:52   DG CHEST PORT 1 VIEW  Result Date:  01/16/2020 CLINICAL DATA:  Weight loss. Negative COVID test. History of CHF and hypertension. EXAM: PORTABLE CHEST 1 VIEW COMPARISON:  09/10/2017, report only FINDINGS: Patient rotated minimally right. Mild tracheal deviation to the left, suggesting a component of volume loss within the left hemithorax. Normal heart size. Atherosclerosis in the transverse aorta. No pleural effusion or pneumothorax. Mild hyperinflation and diffuse interstitial thickening. Right lung base scarring laterally. Left lower lobe airspace disease. Numerous leads and wires project over the chest. IMPRESSION: Left lower lobe airspace disease of indeterminate acuity. Although this could represent scarring, infection or aspiration cannot be excluded. Underlying hyperinflation and interstitial thickening, most consistent with COPD/chronic bronchitis. Aortic Atherosclerosis (ICD10-I70.0). Electronically Signed   By: Abigail Miyamoto M.D.   On: 01/16/2020 10:14   ECHOCARDIOGRAM COMPLETE  Result Date: 01/17/2020    ECHOCARDIOGRAM REPORT   Patient Name:   Kam Fairbank Date of Exam: 01/17/2020 Medical Rec #:  [id number]      Height:       69.0 in Accession #:    [id number]     Weight:       155.0 lb Date of Birth:  13-Feb-1956      BSA:          1.854 m Patient Age:    64 years       BP:           125/58 mmHg Patient Gender: M              HR:           70 bpm. Exam Location:  Inpatient Procedure: 2D Echo Indications:    CHF- Acute Systolic T73.22  History:        Patient has no prior history of Echocardiogram examinations.                 CAD, COPD; Risk Factors:Hypertension.  Sonographer:    Mikki Santee RDCS (AE) Referring Phys: [phone] Ming Vernice Mannina IMPRESSIONS  1. Left ventricular ejection fraction, by estimation, is 55 to 60%. The left ventricle has normal function. The left ventricle has no regional wall motion abnormalities. Left ventricular diastolic parameters are consistent with Grade I diastolic dysfunction (impaired relaxation).  2. Right  ventricular systolic function is normal. The right ventricular size is normal. There is moderately elevated pulmonary artery systolic pressure.  3. Left atrial size was mild to moderately dilated.  4. Right atrial size was mild to moderately dilated.  5. The mitral valve is grossly normal. No evidence of mitral valve regurgitation.  6. The aortic valve is tricuspid. Aortic valve regurgitation is mild. Comparison(s): No prior Echocardiogram. FINDINGS  Left Ventricle: Left ventricular ejection fraction, by estimation, is 55 to 60%. The left ventricle has normal function. The left ventricle has no regional wall motion abnormalities. The left ventricular internal cavity size was normal in size. There is  no left ventricular hypertrophy. Left ventricular  diastolic parameters are consistent with Grade I diastolic dysfunction (impaired relaxation). Right Ventricle: The right ventricular size is normal. No increase in right ventricular wall thickness. Right ventricular systolic function is normal. There is moderately elevated pulmonary artery systolic pressure. The tricuspid regurgitant velocity is 3.29 m/s, and with an assumed right atrial pressure of 8 mmHg, the estimated right ventricular systolic pressure is 77.4 mmHg. Left Atrium: Left atrial size was mild to moderately dilated. Right Atrium: Right atrial size was mild to moderately dilated. Pericardium: There is no evidence of pericardial effusion. Mitral Valve: The mitral valve is grossly normal. No evidence of mitral valve regurgitation. Tricuspid Valve: The tricuspid valve is grossly normal. Tricuspid valve regurgitation is mild. Aortic Valve: Veno contracta 3 mm, even at post ectopic beat. The aortic valve is tricuspid. Aortic valve regurgitation is mild. Aortic regurgitation PHT measures 691 msec. Pulmonic Valve: The pulmonic valve was not well visualized. Pulmonic valve regurgitation is not visualized. Aorta: The aortic root is normal in size and structure.  IAS/Shunts: The atrial septum is grossly normal.  LEFT VENTRICLE PLAX 2D LVIDd:         4.58 cm  Diastology LVIDs:         3.13 cm  LV e' medial:    8.32 cm/s LV PW:         1.13 cm  LV E/e' medial:  10.0 LV IVS:        0.93 cm  LV e' lateral:   10.50 cm/s LVOT diam:     2.00 cm  LV E/e' lateral: 7.9 LV SV:         64 LV SV Index:   35 LVOT Area:     3.14 cm  RIGHT VENTRICLE RV S prime:     10.90 cm/s TAPSE (M-mode): 1.6 cm LEFT ATRIUM             Index       RIGHT ATRIUM           Index LA diam:        3.40 cm 1.83 cm/m  RA Area:     19.70 cm LA Vol (A2C):   80.6 ml 43.48 ml/m RA Volume:   57.40 ml  30.96 ml/m LA Vol (A4C):   43.2 ml 23.30 ml/m LA Biplane Vol: 58.8 ml 31.72 ml/m  AORTIC VALVE LVOT Vmax:   109.00 cm/s LVOT Vmean:  70.500 cm/s LVOT VTI:    0.205 m AI PHT:      691 msec  AORTA Ao Root diam: 2.70 cm MITRAL VALVE               TRICUSPID VALVE MV Area (PHT): 3.19 cm    TR Peak grad:   43.3 mmHg MV Decel Time: 238 msec    TR Vmax:        329.00 cm/s MV E velocity: 83.20 cm/s MV A velocity: 93.00 cm/s  SHUNTS MV E/A ratio:  0.89        Systemic VTI:  0.20 m                            Systemic Diam: 2.00 cm Rudean Haskell MD Electronically signed by Rudean Haskell MD Signature Date/Time: 01/17/2020/1:59:17 PM    Final    US ABDOMEN LIMITED RUQ  Result Date: 01/15/2020 CLINICAL DATA:  Right upper quadrant abdominal pain. History of biliary stenting for stricture in 2010. EXAM: ULTRASOUND ABDOMEN LIMITED RIGHT UPPER QUADRANT  COMPARISON:  Report only from abdominal CT 09/07/2008 and ERCP 09/09/2008. Today CT is correlated. FINDINGS: Gallbladder: The sonographer reports a shadowing calculus within the gallbladder fundus. In correlating the ultrasound images with the nearly concurrent abdominal CT, I suspect this 11 mm calculus is actually within a dilated common hepatic duct. The gallbladder was moderately distended on CT and not clearly imaged on this study. Common bile duct: Diameter:  The reported diameter of the common bile duct is 5 mm. However, as discussed above, there is significant intra and extrahepatic biliary dilatation as correlated with CT. The common hepatic duct measures up to 15 mm in diameter. The biliary stent is not well visualized on these images. Intraductal filling defect measuring 11 mm, most consistent with choledocholithiasis. Liver: Mildly increased hepatic echogenicity without focal abnormality. Intrahepatic biliary dilatation. Portal vein is patent on color Doppler imaging with normal direction of blood flow towards the liver. Other: None. IMPRESSION: 1. This ultrasound, which was performed immediately prior to abdominal CT, does not optimally portray the perceived abnormalities on CT. Combining the 2 studies, there is moderate intra and extrahepatic biliary dilatation in this patient with a biliary stent. There is a probable intraductal filling defect adjacent to the proximal aspect of the stent consistent with choledocholithiasis. 2. See separate CT report. Electronically Signed   By: Richardean Sale M.D.   On: 01/15/2020 17:05    Orson Eva, DO  [facility]  If 7PM-7AM, please contact night-coverage [email or web address] Password [email or web address] 01/17/2020, 2:14 PM   LOS: 2 days

## 2020-01-18 DIAGNOSIS — K831 Obstruction of bile duct: Secondary | ICD-10-CM

## 2020-01-18 DIAGNOSIS — K8050 Calculus of bile duct without cholangitis or cholecystitis without obstruction: Secondary | ICD-10-CM | POA: Diagnosis not present

## 2020-01-18 DIAGNOSIS — R1319 Other dysphagia: Secondary | ICD-10-CM | POA: Diagnosis not present

## 2020-01-18 LAB — COMPREHENSIVE METABOLIC PANEL
ALT: 78 U/L — ABNORMAL HIGH (ref 0–44)
AST: 109 U/L — ABNORMAL HIGH (ref 15–41)
Albumin: 1.8 g/dL — ABNORMAL LOW (ref 3.5–5.0)
Alkaline Phosphatase: 659 U/L — ABNORMAL HIGH (ref 38–126)
Anion gap: 6 (ref 5–15)
BUN: <5 mg/dL — ABNORMAL LOW (ref 8–23)
CO2: 29 mmol/L (ref 22–32)
Calcium: 8.2 mg/dL — ABNORMAL LOW (ref 8.9–10.3)
Chloride: 98 mmol/L (ref 98–111)
Creatinine, Ser: 0.38 mg/dL — ABNORMAL LOW (ref 0.61–1.24)
GFR, Estimated: >60 mL/min (ref >60)
Glucose, Bld: 83 mg/dL (ref 70–99)
Potassium: 4.7 mmol/L (ref 3.5–5.1)
Sodium: 133 mmol/L — ABNORMAL LOW (ref 135–145)
Total Bilirubin: 2.3 mg/dL — ABNORMAL HIGH (ref 0.3–1.2)
Total Protein: 6.4 g/dL — ABNORMAL LOW (ref 6.5–8.1)

## 2020-01-18 LAB — PROTIME-INR
INR: 1 (ref 0.8–1.2)
Prothrombin Time: 12.8 seconds (ref 11.4–15.2)

## 2020-01-18 LAB — CBC
HCT: 29 % — ABNORMAL LOW (ref 39.0–52.0)
Hemoglobin: 8.5 g/dL — ABNORMAL LOW (ref 13.0–17.0)
MCH: 24.6 pg — ABNORMAL LOW (ref 26.0–34.0)
MCHC: 29.3 g/dL — ABNORMAL LOW (ref 30.0–36.0)
MCV: 84.1 fL (ref 80.0–100.0)
Platelets: 454 10*3/uL — ABNORMAL HIGH (ref 150–400)
RBC: 3.45 MIL/uL — ABNORMAL LOW (ref 4.22–5.81)
RDW: 15.4 % (ref 11.5–15.5)
WBC: 7.1 10*3/uL (ref 4.0–10.5)
nRBC: 0 % (ref 0.0–0.2)

## 2020-01-18 MED ORDER — ENOXAPARIN SODIUM 40 MG/0.4ML ~~LOC~~ SOLN: 40.0000 mg | Freq: Once | SUBCUTANEOUS | Status: DC

## 2020-01-18 MED ORDER — ENOXAPARIN SODIUM 40 MG/0.4ML ~~LOC~~ SOLN
40.0000 mg | Freq: Once | SUBCUTANEOUS | Status: AC
  Administered 2020-01-18: 40 mg via SUBCUTANEOUS
  Filled 2020-01-18: qty 0.4

## 2020-01-18 MED ORDER — IPRATROPIUM-ALBUTEROL 0.5-2.5 (3) MG/3ML IN SOLN
3.0000 mL | Freq: Four times a day (QID) | RESPIRATORY_TRACT | Status: DC | PRN
  Administered 2020-01-22: 3 mL via RESPIRATORY_TRACT
  Filled 2020-01-18: qty 3

## 2020-01-18 NOTE — Progress Notes (Addendum)
   01/18/20 2216  Provider Notification  Provider Name/Title Angelina Sheriff  Date Provider Notified 01/18/20  Time Provider Notified 2216  Notification Type Page  Notification Reason Other (Comment) (HR-55, pt. on coreg)  Response Other (Comment) (waiting)    To hold COREG per provider

## 2020-01-18 NOTE — Progress Notes (Signed)
Initial Nutrition Assessment  INTERVENTION:   -Ensure Enlive po TID, each supplement provides 350 kcal and 20 grams of protein -Magic cup BID with meals, each supplement provides 290 kcal and 9 grams of protein -Weight for admission  NUTRITION DIAGNOSIS:   Inadequate oral intake related to dysphagia as evidenced by per patient/family report.  GOAL:   Patient will meet greater than or equal to 90% of their needs  MONITOR:   PO intake, Supplement acceptance, Labs, Weight trends, I & O's  REASON FOR ASSESSMENT:   Consult Assessment of nutrition requirement/status  ASSESSMENT:   64 year old male with a history of COPD, chronic respiratory failure on 2.5 L, hypertension, coronary disease, CHF presenting with dysphagia, abdominal pain, weight loss, and generalized weakness.  Patient reports his dysphagia began ~2 months ago, worse with solids. Pt was able to drink some Ensure at home PTA. States his swallowing is a little better today.  Pt was NPO this morning when RD talked to him but now diet is advanced to soft. Per GI note, plan is for ERCP on 10/13.   Pt reports weight loss of 40 lbs since his swallowing function decreased. States he usually weighs ~150 lbs.   Suspect recorded weight for this admission from 10/10 an error as it is the exact same weight from previous encounter on 12/21/12.  Will need new weight to determine weight status. Suspect some degree of malnutrition but unable to diagnose at this time.  Medications: KLOR-CON Labs reviewed: Low Na  NUTRITION - FOCUSED PHYSICAL EXAM:  Unable to complete  Diet Order:   Diet Order            DIET SOFT Room service appropriate? Yes; Fluid consistency: Thin  Diet effective now                 EDUCATION NEEDS:   No education needs have been identified at this time  Skin:  Skin Assessment: Reviewed RN Assessment  Last BM:  10/10  Height:   Ht Readings from Last 1 Encounters:  01/17/20 5\' 9"  (1.753 m)     Weight:   Wt Readings from Last 1 Encounters:  01/17/20 70.3 kg    BMI:  Body mass index is 22.89 kg/m.  Estimated Nutritional Needs:   Kcal:  1800-2000  Protein:  85-100g  Fluid:  2L/day  Clayton Bibles, MS, RD, LDN Inpatient Clinical Dietitian Contact information available via Amion

## 2020-01-18 NOTE — Progress Notes (Signed)
PROGRESS NOTE    Kenneth Coleman  KGU:[id number] DOB: [birth year] DOA: 01/15/2020 PCP: System, Provider Not In    Brief Narrative: HPI per Dr. Carles Collet 01/17/2020 64 year old male with a history of COPD, chronic respiratory failure on 2.5 L, hypertension, coronary disease, CHF presenting with dysphagia, abdominal pain, weight loss, and generalized weakness. The patient is a poor historian. Most of the information is obtained from review of medical records. The patient has had progressive dysphagia to solid food over the last 2 months. He has had decreased oral intake. He has been drinking Ensure as much as possible. He endorses a 40 pound weight loss over the past few months. In addition, he has been having intermittent episodes of epigastric and right upper quadrant pain exacerbated by eating. He denies any actual vomiting, nausea, fevers, chills, abdominal distention, hematochezia, melena, dysuria, hematuria. He is having normal bowel movements. Apparently, the patient was recently admitted to [facility] in [place] and [place] where he had a 5-day hospital stay. A CT of the abdomen and pelvis at that time showed dilated intrahepatic and extrahepatic biliary ducts secondary to choledocholithiasis. There was no on-call gastroenterology at that facility. The patient was ultimately discharged from the hospital with instructions for GI follow-up. He did not follow-up with GI. Because of abdominal pain, he went to [facility] department on 01/13/2020. They recommended transfer to a tertiary care facility. However, the patient left AGAINST MEDICAL ADVICE. Nevertheless, he continued to have dysphagia, generalized weakness, and intermittent abdominal pain. As result, he presented to [facility] further evaluation. Imaging at [facility] also showed circumferential wall thickening of the proximal esophagus concerning for esophagitis or infiltrative process. There is also concern for a periapical  lucency and area of bony destruction in the left maxilla. In the emergency department, the patient had oxygen saturation 88 to 91% on 6 L. He was afebrile with soft blood pressures. GI was consulted and is planning for endoscopy. BMP showed potassium 2.7. AST 90, ALT 73, alk phosphatase 681, total bilirubin 2.6, lipase 21. WBC 14.8, hemoglobin 10.2, platelets 5 86,000. CT of the abdomen and pelvis showed moderate intrahepatic and extrahepatic biliary ductal dilatation. There was a plastic biliary stent in place. There is possible calculus in the common hepatic duct adjacent to the stent. There is also pancreatic ductal dilatation. Right upper quadrant ultrasound showed moderate intrahepatic and extrahepatic biliary duct dilatation with biliary stent. There is also a probable intraductal filling defect adjacent to the proximal aspect of the stent consistent with choledocholithiasis.  Assessment & Plan:   Principal Problem:   Choledocholithiasis Active Problems:   CHF (congestive heart failure) (HCC)   COPD (chronic obstructive pulmonary disease) (HCC)   Coronary artery disease   Hypertension   Hypokalemia   Acute on chronic respiratory failure with hypoxia (HCC)   Lobar pneumonia ([place])  #1 choledocholithiasis-terminal ultrasound 01/15/2020 shows gallbladder stone and dilated common hepatic duct with 11 mm stone could be causing filling defect CBD 5 mm biliary stent is not well visualized. CT abdomen and pelvis 01/15/2019 21 moderate intra and extrahepatic biliary ductal dilatation concerning for recurrent biliary obstruction.  Plastic stent in place.  Nonfunctional.  11 mm soft tissue nodule of the hepatic hilum could be intrabiliary.  Pancreatic duct is also dilated.  Possible stone in the common hepatic duct adjacent to the stent versus stent encrustation. LFTs elevated follow-up labs in a.m. EGD 01/16/2020 areas of white plaques and pallor which was biopsied, nonbleeding 2 duodenal AVMs  and moderate hiatal hernia. Patient was  transferred to [facility] for ERCP   #2 dysphagia/esophageal thickening concerning for malignancy or other infiltrative process.  CT of the head and neck done 12/10/2019 shows proximal esophageal thickening. EGD 01/16/2020   #3 acute on chronic hypoxic respiratory failure secondary to aspiration pneumonitis suspected.  CT of the chest show scattered pulmonary nodules. Patient is on 2.5 L of oxygen at baseline.  Oxygen requirement went up to 4 L with saturation 88 to 91% prior to transfer from [facility].  This is thought to be likely secondary to aspiration pneumonitis in the setting of underlying severe COPD.  He was Covid negative.  Procalcitonin was 1.73 D-dimer was 0.84.  CT chest shows multifocal endobronchial debris's and mucous plugging most with new heterogeneous opacification of the left lower lobe.  Nodularity in the left upper lobe and right lower lobe likely infectious or inflammatory. Continue cefepime and Flagyl.  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  #5 chronic diastolic CHF patient appears euvolemic ejection fraction 55 to 60% with no wall motion abnormalities and grade 1 diastolic dysfunction.   #6 failure to thrive with over 30 pound weight loss recently.  Work-up included normal TSH folate and B12.  PT eval is pending.  Estimated body mass index is 22.89 kg/m as calculated from the following:   Height as of this encounter: _0  (1.753 m).   Weight as of this encounter: 70.3 kg.  DVT prophylaxis: SCD Code Status: Full code  family Communication: None at bedside  disposition Plan:  Status is: Inpatient  Dispo: The patient is from: Home              Anticipated d/c is to: Unknown              Anticipated d/c date is: 3 days              Patient currently is not medically stable to d/c.   Consultants:   GI  Procedures: EGD 01/17/2020 Antimicrobials: Maxipime and Flagyl  Subjective: He is resting in bed  complaining of epigastric pain asking something for pain  Objective: Vitals:   01/18/20 0238 01/18/20 0420 01/18/20 0957 01/18/20 1017  BP: (!) 121/56 128/64  130/61  Pulse: 61 63  60  Resp: _1 Temp: 98.2 F (36.8 C) 98 F (36.7 C)  97.6 F (36.4 C)  TempSrc: Oral Oral  Oral  SpO2: 100% 100% 99% 100%  Weight:      Height:        Intake/Output Summary (Last 24 hours) at 01/18/2020 1049 Last data filed at 01/18/2020 1022 Gross per 24 hour  Intake 2493.04 ml  Output 875 ml  Net 1618.04 ml   Filed Weights   01/17/20 1100  Weight: 70.3 kg    Examination:  General exam: Appears calm and comfortable  Respiratory system: Clear to auscultation. Respiratory effort normal. Cardiovascular system: S1 & S2 heard, RRR. No JVD, murmurs, rubs, gallops or clicks. No pedal edema. Gastrointestinal system: Abdomen is nondistended, soft and epigastric tender. No organomegaly or masses felt. Normal bowel sounds heard. Central nervous system: Alert and oriented. No focal neurological deficits. Extremities: Symmetric 5 x 5 power. Skin: No rashes, lesions or ulcers Psychiatry: Judgement and insight appear normal. Mood & affect appropriate.     Data Reviewed: I have personally reviewed following labs and imaging studies  CBC: Recent Labs  Lab 01/15/20 1336 01/16/20 1752  WBC 14.8* 19.6*  NEUTROABS 12.4*  --  HGB 10.2* 8.8*  HCT 33.1* 29.2*  MCV 82.1 84.9  PLT 586* 818*   Basic Metabolic Panel: Recent Labs  Lab 01/15/20 1336 01/16/20 0443 01/17/20 0605  NA 134* 136 133*  K 2.7* 3.6 3.9  CL 88* 96* 97*  CO2 32 31 30  GLUCOSE 103* 73 97  BUN 15 10 7*  CREATININE 0.39* 0.35* 0.33*  CALCIUM 8.7* 8.4* 8.3*  MG 2.0  --   --    GFR: Estimated Creatinine Clearance: 91.5 mL/min (A) (by C-G formula based on SCr of 0.33 mg/dL (L)). Liver Function Tests: Recent Labs  Lab 01/15/20 1336 01/16/20 0443 01/17/20 0605  AST 90* 73* 63*  ALT 73* 62* 56*  ALKPHOS 681* 547*  522*  BILITOT 2.6* 2.6* 2.1*  PROT 7.7 6.8 6.6  ALBUMIN 2.6* 2.2* 2.1*   Recent Labs  Lab 01/15/20 1336  LIPASE 21   No results for input(s): AMMONIA in the last 168 hours. Coagulation Profile: No results for input(s): INR, PROTIME in the last 168 hours. Cardiac Enzymes: No results for input(s): CKTOTAL, CKMB, CKMBINDEX, TROPONINI in the last 168 hours. BNP (last 3 results) No results for input(s): PROBNP in the last 8760 hours. HbA1C: No results for input(s): HGBA1C in the last 72 hours. CBG: No results for input(s): GLUCAP in the last 168 hours. Lipid Profile: No results for input(s): CHOL, HDL, LDLCALC, TRIG, CHOLHDL, LDLDIRECT in the last 72 hours. Thyroid Function Tests: Recent Labs    01/16/20 1012  TSH 1.072  FREET4 0.93   Anemia Panel: Recent Labs    01/16/20 1012  VITAMINB12 428  FOLATE 10.4   Sepsis Labs: Recent Labs  Lab 01/16/20 1012  PROCALCITON 1.73    Recent Results (from the past 240 hour(s))  Resp Panel by RT PCR (RSV, Flu A&B, Covid) - Nasopharyngeal Swab     Status: None   Collection Time: 01/15/20  1:48 PM   Specimen: Nasopharyngeal Swab  Result Value Ref Range Status   SARS Coronavirus 2 by RT PCR NEGATIVE NEGATIVE Final    Comment: (NOTE) SARS-CoV-2 target nucleic acids are NOT DETECTED.  The SARS-CoV-2 RNA is generally detectable in upper respiratoy specimens during the acute phase of infection. The lowest concentration of SARS-CoV-2 viral copies this assay can detect is 131 copies/mL. A negative result does not preclude SARS-Cov-2 infection and should not be used as the sole basis for treatment or other patient management decisions. A negative result may occur with  improper specimen collection/handling, submission of specimen other than nasopharyngeal swab, presence of viral mutation(s) within the areas targeted by this assay, and inadequate number of viral copies (<131 copies/mL). A negative result must be combined with  clinical observations, patient history, and epidemiological information. The expected result is Negative.  Fact Sheet for Patients:  PinkCheek.be  Fact Sheet for Healthcare Providers:  GravelBags.it  This test is no t yet approved or cleared by the [place] FDA and  has been authorized for detection and/or diagnosis of SARS-CoV-2 by FDA under an Emergency Use Authorization (EUA). This EUA will remain  in effect (meaning this test can be used) for the duration of the COVID-19 declaration under Section 564(b)(1) of the Act, 21 U.S.C. section 360bbb-3(b)(1), unless the authorization is terminated or revoked sooner.     Influenza A by PCR NEGATIVE NEGATIVE Final   Influenza B by PCR NEGATIVE NEGATIVE Final    Comment: (NOTE) The Xpert Xpress SARS-CoV-2/FLU/RSV assay is intended as an aid in  the diagnosis  of influenza from Nasopharyngeal swab specimens and  should not be used as a sole basis for treatment. Nasal washings and  aspirates are unacceptable for Xpert Xpress SARS-CoV-2/FLU/RSV  testing.  Fact Sheet for Patients: PinkCheek.be  Fact Sheet for Healthcare Providers: GravelBags.it  This test is not yet approved or cleared by the [place] FDA and  has been authorized for detection and/or diagnosis of SARS-CoV-2 by  FDA under an Emergency Use Authorization (EUA). This EUA will remain  in effect (meaning this test can be used) for the duration of the  Covid-19 declaration under Section 564(b)(1) of the Act, 21  U.S.C. section 360bbb-3(b)(1), unless the authorization is  terminated or revoked.    Respiratory Syncytial Virus by PCR NEGATIVE NEGATIVE Final    Comment: (NOTE) Fact Sheet for Patients: PinkCheek.be  Fact Sheet for Healthcare Providers: GravelBags.it  This test is not yet approved  or cleared by the [place] FDA and  has been authorized for detection and/or diagnosis of SARS-CoV-2 by  FDA under an Emergency Use Authorization (EUA). This EUA will remain  in effect (meaning this test can be used) for the duration of the  COVID-19 declaration under Section 564(b)(1) of the Act, 21 U.S.C.  section 360bbb-3(b)(1), unless the authorization is terminated or  revoked. Performed at [facility], [address], [place], [place] [address]   MRSA PCR Screening     Status: Abnormal   Collection Time: 01/17/20 12:58 PM   Specimen: Nasopharyngeal  Result Value Ref Range Status   MRSA by PCR POSITIVE (A) NEGATIVE Final    Comment:        The GeneXpert MRSA Assay (FDA approved for NASAL specimens only), is one component of a comprehensive MRSA colonization surveillance program. It is not intended to diagnose MRSA infection nor to guide or monitor treatment for MRSA infections. RESULT CALLED TO, READ BACK BY AND VERIFIED WITH: L BULLINS AT 1723 ON 01/17/2020 BY MOSLEY,J Performed at [facility], [address], [place], [place] [address]          Radiology Studies: CT SOFT TISSUE NECK W CONTRAST  Result Date: 01/16/2020 CLINICAL DATA:  Esophageal mass. EXAM: CT NECK WITH CONTRAST TECHNIQUE: Multidetector CT imaging of the neck was performed using the standard protocol following the bolus administration of intravenous contrast. CONTRAST:  32mL OMNIPAQUE IOHEXOL 300 MG/ML  SOLN COMPARISON:  CT chest scratched at CT neck with contrast 12/21/2012 FINDINGS: Pharynx and larynx: No focal mucosal or submucosal lesions are present. Nasopharynx is clear. Soft palate and tongue base are normal. Epiglottis is normal. The hypopharynx is clear. Vocal cords are midline and symmetric. The trachea is unremarkable. Salivary glands: The submandibular and parotid glands and ducts are within normal limits. Thyroid: Normal Lymph nodes: No significant cervical adenopathy is present. Vascular:  Atherosclerotic changes are noted at the aortic arch great vessel origins the carotid bifurcations bilaterally. Moderate to high-grade stenosis is present on the right. No significant stenosis is present on the left. Limited intracranial: Within normal limits. Visualized orbits: The globes and orbits are within normal limits. Mastoids and visualized paranasal sinuses: Chronic right sphenoid sinus opacification is present. The paranasal sinuses and mastoid air cells are otherwise clear. Skeleton: Multilevel degenerative changes are present cervical spine. Slight degenerative anterolisthesis present at C3-4 associated with uncovertebral spurring and foraminal narrowing bilaterally. Foraminal narrowing is present in the lower cervical spine as well. No focal lytic or blastic lesions are present. Dental caries are present within the residual teeth. Upper chest:  The lung apices are clear. Thoracic inlet is normal. There is some thickening of the esophagus without a discrete mass. IMPRESSION: 1. Mild thickening of the upper esophagus without a discrete mass. No pneumomediastinum. 2. No focal mucosal or submucosal lesions. 3. No significant cervical adenopathy. 4. Moderate to high-grade stenosis of the proximal right internal carotid artery. 5. Multilevel degenerative changes of the cervical spine as described. 6. Dental caries. 7. Chronic right sphenoid sinus disease. 8. Aortic Atherosclerosis (ICD10-I70.0). Electronically Signed   By: San Morelle M.D.   On: 01/16/2020 19:36   CT CHEST W CONTRAST  Result Date: 01/16/2020 CLINICAL DATA:  Dysphagia and weight loss EXAM: CT CHEST WITH CONTRAST TECHNIQUE: Multidetector CT imaging of the chest was performed during intravenous contrast administration. CONTRAST:  27m OMNIPAQUE IOHEXOL 300 MG/ML  SOLN COMPARISON:  October 8th 2011 FINDINGS: Cardiovascular: No significant vascular findings. No central pulmonary embolism. Normal heart size. No pericardial effusion.  Atherosclerotic calcifications of the aorta. Mediastinum/Nodes: No enlarged mediastinal, hilar, or axillary lymph nodes. Thyroid gland, and esophagus demonstrate no significant findings. Lungs/Pleura: Multifocal endobronchial debris and mucus plugging most predominant in the LEFT lower lobe. There is heterogeneous opacification of the LEFT lower lobe, new since prior. Mild centrilobular nodularity in the RIGHT lower lobe. Mild to moderate centrilobular emphysema. Centrilobular nodularity in the LEFT upper lobe with a representative more focal nodule measuring 4 mm (series 5, image 33). Subpleural nodule of the RIGHT upper lobe measures 3 mm (series 5, image 88). Irregular nodule along the RIGHT paramediastinal border measures 4 mm (series 5, image 102). Upper Abdomen: Revisualization of moderate intrahepatic biliary ductal dilation, unchanged in comparison to prior. Musculoskeletal: Gynecomastia.  No acute osseous abnormality. IMPRESSION: 1. Multifocal endobronchial debris and mucus plugging most with new heterogeneous opacification of the LEFT lower lobe. Findings are concerning for aspiration and aspiration pneumonitis. 2. Centrilobular nodularity in the LEFT upper lobe and RIGHT lower lobe, likely infectious or inflammatory. 3. Scattered pulmonary nodules measuring up to 4 mm. Recommend follow-up CT in 1 year to assess for stability. Aortic Atherosclerosis (ICD10-I70.0) and Emphysema (ICD10-J43.9). Electronically Signed   By: SValentino SaxonMD   On: 01/16/2020 19:13   ECHOCARDIOGRAM COMPLETE  Result Date: 01/17/2020    ECHOCARDIOGRAM REPORT   Patient Name:   Kartel Holshouser Date of Exam: 01/17/2020 Medical Rec #:  [id number]     Height:       69.0 in Accession #:    [id number]    Weight:       155.0 lb Date of Birth:  [birth year]     BSA:          1.854 m Patient Age:    89years       BP:           125/58 mmHg Patient Gender: M              HR:           70 bpm. Exam Location:  Inpatient Procedure: 2D  Echo Indications:    CHF- Acute Systolic IV01.31 History:        Patient has no prior history of Echocardiogram examinations.                 CAD, COPD; Risk Factors:Hypertension.  Sonographer:    CMikki SanteeRDCS (AE) Referring Phys: [id number] TAT IMPRESSIONS  1. Left ventricular ejection fraction, by estimation, is 55 to 60%. The left ventricle has normal function. The left  ventricle has no regional wall motion abnormalities. Left ventricular diastolic parameters are consistent with Grade I diastolic dysfunction (impaired relaxation).  2. Right ventricular systolic function is normal. The right ventricular size is normal. There is moderately elevated pulmonary artery systolic pressure.  3. Left atrial size was mild to moderately dilated.  4. Right atrial size was mild to moderately dilated.  5. The mitral valve is grossly normal. No evidence of mitral valve regurgitation.  6. The aortic valve is tricuspid. Aortic valve regurgitation is mild. Comparison(s): No prior Echocardiogram. FINDINGS  Left Ventricle: Left ventricular ejection fraction, by estimation, is 55 to 60%. The left ventricle has normal function. The left ventricle has no regional wall motion abnormalities. The left ventricular internal cavity size was normal in size. There is  no left ventricular hypertrophy. Left ventricular diastolic parameters are consistent with Grade I diastolic dysfunction (impaired relaxation). Right Ventricle: The right ventricular size is normal. No increase in right ventricular wall thickness. Right ventricular systolic function is normal. There is moderately elevated pulmonary artery systolic pressure. The tricuspid regurgitant velocity is 3.29 m/s, and with an assumed right atrial pressure of 8 mmHg, the estimated right ventricular systolic pressure is 02.5 mmHg. Left Atrium: Left atrial size was mild to moderately dilated. Right Atrium: Right atrial size was mild to moderately dilated. Pericardium: There is no  evidence of pericardial effusion. Mitral Valve: The mitral valve is grossly normal. No evidence of mitral valve regurgitation. Tricuspid Valve: The tricuspid valve is grossly normal. Tricuspid valve regurgitation is mild. Aortic Valve: Veno contracta 3 mm, even at post ectopic beat. The aortic valve is tricuspid. Aortic valve regurgitation is mild. Aortic regurgitation PHT measures 691 msec. Pulmonic Valve: The pulmonic valve was not well visualized. Pulmonic valve regurgitation is not visualized. Aorta: The aortic root is normal in size and structure. IAS/Shunts: The atrial septum is grossly normal.  LEFT VENTRICLE PLAX 2D LVIDd:         4.58 cm  Diastology LVIDs:         3.13 cm  LV e' medial:    8.32 cm/s LV PW:         1.13 cm  LV E/e' medial:  10.0 LV IVS:        0.93 cm  LV e' lateral:   10.50 cm/s LVOT diam:     2.00 cm  LV E/e' lateral: 7.9 LV SV:         64 LV SV Index:   35 LVOT Area:     3.14 cm  RIGHT VENTRICLE RV S prime:     10.90 cm/s TAPSE (M-mode): 1.6 cm LEFT ATRIUM             Index       RIGHT ATRIUM           Index LA diam:        3.40 cm 1.83 cm/m  RA Area:     19.70 cm LA Vol (A2C):   80.6 ml 43.48 ml/m RA Volume:   57.40 ml  30.96 ml/m LA Vol (A4C):   43.2 ml 23.30 ml/m LA Biplane Vol: 58.8 ml 31.72 ml/m  AORTIC VALVE LVOT Vmax:   109.00 cm/s LVOT Vmean:  70.500 cm/s LVOT VTI:    0.205 m AI PHT:      691 msec  AORTA Ao Root diam: 2.70 cm MITRAL VALVE               TRICUSPID VALVE MV Area (PHT): 3.19 cm  TR Peak grad:   43.3 mmHg MV Decel Time: 238 msec    TR Vmax:        329.00 cm/s MV E velocity: 83.20 cm/s MV A velocity: 93.00 cm/s  SHUNTS MV E/A ratio:  0.89        Systemic VTI:  0.20 m                            Systemic Diam: 2.00 cm Rudean Haskell MD Electronically signed by Rudean Haskell MD Signature Date/Time: 01/17/2020/1:59:17 PM    Final         Scheduled Meds: . budesonide (PULMICORT) nebulizer solution  0.5 mg Nebulization BID  . carvedilol  6.25  mg Oral BID  . Chlorhexidine Gluconate Cloth  6 each Topical Q0600  . digoxin  125 mcg Oral Daily  . feeding supplement (ENSURE ENLIVE)  237 mL Oral TID BM  . ipratropium-albuterol  3 mL Nebulization Q6H  . mupirocin ointment  1 application Nasal BID  . potassium chloride  40 mEq Oral BID  . spironolactone  25 mg Oral Daily   Continuous Infusions: . sodium chloride 50 mL/hr at 01/17/20 2059  . ceFEPime (MAXIPIME) IV 2 g (01/18/20 0506)  . metronidazole 500 mg (01/18/20 0604)     LOS: 3 days     Georgette Shell, MD 01/18/2020, 10:49 AM

## 2020-01-18 NOTE — Consult Note (Signed)
[facility] Consult: 11:00 AM 01/18/2020  LOS: 3 days    Referring Provider: Dr. Zigmund Daniel Primary Care Physician:  System, Provider Not In Primary Gastroenterologist:  Dr Maylon Peppers    Reason for Consultation: Retained biliary stent.   HPI: Kenneth Coleman is a 64 y.o. male.  Resident of [place].  PMH COPD.  Hypertension.  S/p ERCP with stent placement 09/2008 in [place].  Lost to follow-up. Very recently established care with a primary care provider in [place].  C/o 3 to 4 months of intermittent solid and liquid food dysphagia.  Senses food getting stuck either in the upper or lower esophagus.  Spits up clear mucus but no regurgitation of food.  The dysphagia is intermittent and unpredictable.  Sometimes he can tolerate certain solid foods and then a few hours later experienced dysphagia with the same foods.  Describes epigastric pain associated with dysphagia in the region of the lower esophagus.  Pain is relieved with oxycodone which he buys off the street.  Has not been using antacids or acid suppressing meds.  Appetite intermittently diminished.  He has lost 40 pounds.  CT chest/ab/pelvis performed in [place] on 9/3 showed proximal esophageal thickening concerning for esophagitis vs malignancy.  There was choledocholithiasis with intra and extrahepatic biliary ductal dilatation.  Seen as GI consult on 10/8 by Dr. Jenetta Downer at [facility].   Abdominal ultrasound 01/15/2020.   Gallbladder stone.  Suspect 11 mm stone in dilated CHD.  CBD 5 mm.  Biliary stent not well visualized.  11 mm filling defect, likely stone, in CBD. Repeat CTAP w contrast 01/15/2020 revealed moderate intra and extrahepatic biliary ductal dilatation concerning for recurrent biliary obstruction.  Plastic stent in place,  not functional.  Possible stone in CHD adjacent to the stent vs stent encrustation.  11 mm soft tissue nodule at hepatic hilum, could be intrabiliary.  Pancreatic duct also dilated. Lipase 21.  Alk phos 681 >> 522.  T bili 2.6 >> 2.1.  AST/ALT 90/73 >> 63/56. Na 133.  No renal insufficiency.  Patient's father was 41 when he was born.  He does not know much about his past medical history.  Father had 3 children prior to patient being born, in fact his half-sisters are all older than his mother who was in her 46s when Kenneth Coleman was born.  Pt is an only child.  He is close to his half-sisters and nieces.  Cell hydrocodone looks like he I know Lifetime smoker, currently consumes a pack every 3 days but previously smoked up to 1 pack/day. Has worked in Arboriculturist, [place], Engineer, manufacturing systems in the past. Has a long-term friend, not romantic, who he shares a home with for many years.     Past Medical History:  Diagnosis Date  . CHF (congestive heart failure) ([place])   . COPD (chronic obstructive pulmonary disease) ([place])   . Coronary artery disease   . Hypertension     History reviewed. No pertinent surgical history.  Prior to Admission medications   Medication Sig Start Date End Date Taking? Authorizing  Provider  ASPIRIN LOW DOSE 81 MG EC tablet Take 81 mg by mouth daily. 12/22/19  Yes [provider]  BEVESPI AEROSPHERE 9-4.8 MCG/ACT AERO Take 2 puffs by mouth daily.  12/22/19  Yes [provider]  cholecalciferol (VITAMIN D) 25 MCG (1000 UNIT) tablet Take 1,000 Units by mouth daily.   Yes [provider]  furosemide (LASIX) 20 MG tablet Take 20 mg by mouth daily. 12/22/19  Yes [provider]  spironolactone (ALDACTONE) 25 MG tablet Take 25 mg by mouth daily. 12/22/19  Yes [provider]  carvedilol (COREG) 12.5 MG tablet Take 12.5 mg by mouth 2 (two) times daily. 11/18/19   [provider]  digoxin (LANOXIN) 0.125 MG tablet Take 125 mcg by  mouth daily. 10/22/19   [provider]  HYDROcodone-acetaminophen (LORTAB) 7.5-500 MG/15ML solution Take 10 mLs by mouth every 6 (six) hours as needed for pain. Patient not taking: Reported on 01/15/2020 12/21/12   Rolland Porter, MD  lisinopril (ZESTRIL) 2.5 MG tablet Take 2.5 mg by mouth at bedtime. 10/06/19   [provider]  penicillin v potassium (VEETID) 250 MG/5ML solution Take 10 mLs (500 mg total) by mouth 4 (four) times daily. Patient not taking: Reported on 01/15/2020 12/21/12   Rolland Porter, MD    Scheduled Meds: . budesonide (PULMICORT) nebulizer solution  0.5 mg Nebulization BID  . carvedilol  6.25 mg Oral BID  . Chlorhexidine Gluconate Cloth  6 each Topical Q0600  . digoxin  125 mcg Oral Daily  . feeding supplement (ENSURE ENLIVE)  237 mL Oral TID BM  . ipratropium-albuterol  3 mL Nebulization Q6H  . mupirocin ointment  1 application Nasal BID  . potassium chloride  40 mEq Oral BID  . spironolactone  25 mg Oral Daily   Infusions: . sodium chloride 50 mL/hr at 01/17/20 2059  . ceFEPime (MAXIPIME) IV 2 g (01/18/20 0506)  . metronidazole 500 mg (01/18/20 0604)   PRN Meds: HYDROmorphone (DILAUDID) injection, ondansetron **OR** ondansetron (ZOFRAN) IV   Allergies as of 01/15/2020  . (No Known Allergies)    History reviewed. No pertinent family history.  Social History   Socioeconomic History  . Marital status: Single    Spouse name: Not on file  . Number of children: Not on file  . Years of education: Not on file  . Highest education level: Not on file  Occupational History  . Not on file  Tobacco Use  . Smoking status: Current Every Day Smoker    Types: Cigarettes  . Smokeless tobacco: Never Used  Vaping Use  . Vaping Use: Never used  Substance and Sexual Activity  . Alcohol use: No  . Drug use: Never  . Sexual activity: Not Currently  Other Topics Concern  . Not on file  Social History Narrative  . Not on file   Social Determinants of  Health   Financial Resource Strain:   . Difficulty of Paying Living Expenses: Not on file  Food Insecurity:   . Worried About Charity fundraiser in the Last Year: Not on file  . Ran Out of Food in the Last Year: Not on file  Transportation Needs:   . Lack of Transportation (Medical): Not on file  . Lack of Transportation (Non-Medical): Not on file  Physical Activity:   . Days of Exercise per Week: Not on file  . Minutes of Exercise per Session: Not on file  Stress:   . Feeling of Stress : Not on file  Social Connections:   . Frequency of Communication with Friends and Family: Not on file  . Frequency of Social Gatherings with Friends and Family: Not on file  . Attends Religious Services: Not on file  . Active Member of Clubs or Organizations: Not on file  . Attends Archivist Meetings: Not on file  . Marital Status: Not on file  Intimate Partner Violence:   . Fear of Current or Ex-Partner: Not on file  . Emotionally Abused: Not on file  . Physically Abused: Not on file  . Sexually Abused: Not on file    REVIEW OF SYSTEMS: Constitutional: Somewhat tired and weak.  No profound fatigue. ENT:  No nose bleeds Pulm: Chronic cough productive of clear sputum. CV:  No palpitations, no LE edema.  No angina. GU:  No hematuria, no frequency GI: See HPI. Heme: No unusual bleeding or bruising. Transfusions: No previous history of anemia or blood transfusions. Neuro:  No headaches, no peripheral tingling or numbness.  No syncope, no seizures. Derm:  No itching, no rash or sores.  Endocrine:  No sweats or chills.  No polyuria or dysuria Immunization: Has never received COVID-19 vaccination. Travel:  None beyond local counties in last few months.    PHYSICAL EXAM: Vital signs in last 24 hours: Vitals:   01/18/20 0957 01/18/20 1017  BP:  130/61  Pulse:  60  Resp:  17  Temp:  97.6 F (36.4 C)  SpO2: 99% 100%   Wt Readings from Last 3 Encounters:  01/17/20 70.3 kg    12/21/12 70.3 kg    General: Thin, somewhat wasted appearing, alert, comfortable.  Looks older than stated age.  Not toxic. Head: No facial asymmetry or swelling. + Temporal wasting. Eyes: No scleral icterus.  Conjunctiva is pale.  EOMI Ears: Not hard of hearing Nose: No congestion or discharge Mouth: Very few teeth remain in though these are in poor repair.  Mucosa is moist, pink, clear.  Tongue is midline. Neck: No JVD, no masses, no thyromegaly Lungs: Few crackles in the right base.  Wet cough.  No dyspnea with speech. Heart: RRR.  No MRG.  S1, S2 present. Abdomen: Soft.  Mild epigastric tenderness without guarding or rebound.  No HSM, masses, bruits, hernias.  Active bowel sounds..   Rectal: Deferred. Musc/Skeltl: No joint redness, swelling or gross deformity. Extremities: No CCE. Neurologic: Alert.  Oriented x3.  Appropriate.  Good historian.  Moves all 4 limbs with no gross weakness.  No tremors Skin: No rash, no sores, no suspicious lesions.  Unable to appreciate jaundice. Nodes: No cervical adenopathy Psych: Cooperative, calm, pleasant.  Intake/Output from previous day: 10/10 0701 - 10/11 0700 In: 2493 [P.O.:220; I.V.:1600.4; IV Piggyback:672.6] Out: 425 [Urine:425] Intake/Output this shift: Total I/O In: 0  Out: 450 [Urine:450]  LAB RESULTS: Recent Labs    01/15/20 1336 01/16/20 1752  WBC 14.8* 19.6*  HGB 10.2* 8.8*  HCT 33.1* 29.2*  PLT 586* 476*   BMET Lab Results  Component Value Date   NA 133 (L) 01/17/2020   NA 136 01/16/2020   NA 134 (L) 01/15/2020   K 3.9 01/17/2020   K 3.6 01/16/2020   K 2.7 (LL) 01/15/2020   CL 97 (L) 01/17/2020   CL 96 (L) 01/16/2020   CL 88 (L) 01/15/2020   CO2 30 01/17/2020   CO2 31 01/16/2020   CO2 32 01/15/2020   GLUCOSE 97 01/17/2020   GLUCOSE 73 01/16/2020   GLUCOSE 103 (H) 01/15/2020  BUN 7 (L) 01/17/2020   BUN 10 01/16/2020   BUN 15 01/15/2020   CREATININE 0.33 (L) 01/17/2020   CREATININE 0.35 (L) 01/16/2020    CREATININE 0.39 (L) 01/15/2020   CALCIUM 8.3 (L) 01/17/2020   CALCIUM 8.4 (L) 01/16/2020   CALCIUM 8.7 (L) 01/15/2020   LFT Recent Labs    01/15/20 1336 01/16/20 0443 01/17/20 0605  PROT 7.7 6.8 6.6  ALBUMIN 2.6* 2.2* 2.1*  AST 90* 73* 63*  ALT 73* 62* 56*  ALKPHOS 681* 547* 522*  BILITOT 2.6* 2.6* 2.1*   PT/INR No results found for: INR, PROTIME Hepatitis Panel No results for input(s): HEPBSAG, HCVAB, HEPAIGM, HEPBIGM in the last 72 hours. C-Diff No components found for: CDIFF Lipase     Component Value Date/Time   LIPASE 21 01/15/2020 1336    Drugs of Abuse  No results found for: LABOPIA, COCAINSCRNUR, LABBENZ, AMPHETMU, THCU, LABBARB   RADIOLOGY STUDIES: CT SOFT TISSUE NECK W CONTRAST  Result Date: 01/16/2020 CLINICAL DATA:  Esophageal mass. EXAM: CT NECK WITH CONTRAST TECHNIQUE: Multidetector CT imaging of the neck was performed using the standard protocol following the bolus administration of intravenous contrast. CONTRAST:  18mL OMNIPAQUE IOHEXOL 300 MG/ML  SOLN COMPARISON:  CT chest scratched at CT neck with contrast 12/21/2012 FINDINGS: Pharynx and larynx: No focal mucosal or submucosal lesions are present. Nasopharynx is clear. Soft palate and tongue base are normal. Epiglottis is normal. The hypopharynx is clear. Vocal cords are midline and symmetric. The trachea is unremarkable. Salivary glands: The submandibular and parotid glands and ducts are within normal limits. Thyroid: Normal Lymph nodes: No significant cervical adenopathy is present. Vascular: Atherosclerotic changes are noted at the aortic arch great vessel origins the carotid bifurcations bilaterally. Moderate to high-grade stenosis is present on the right. No significant stenosis is present on the left. Limited intracranial: Within normal limits. Visualized orbits: The globes and orbits are within normal limits. Mastoids and visualized paranasal sinuses: Chronic right sphenoid sinus opacification is  present. The paranasal sinuses and mastoid air cells are otherwise clear. Skeleton: Multilevel degenerative changes are present cervical spine. Slight degenerative anterolisthesis present at C3-4 associated with uncovertebral spurring and foraminal narrowing bilaterally. Foraminal narrowing is present in the lower cervical spine as well. No focal lytic or blastic lesions are present. Dental caries are present within the residual teeth. Upper chest: The lung apices are clear. Thoracic inlet is normal. There is some thickening of the esophagus without a discrete mass. IMPRESSION: 1. Mild thickening of the upper esophagus without a discrete mass. No pneumomediastinum. 2. No focal mucosal or submucosal lesions. 3. No significant cervical adenopathy. 4. Moderate to high-grade stenosis of the proximal right internal carotid artery. 5. Multilevel degenerative changes of the cervical spine as described. 6. Dental caries. 7. Chronic right sphenoid sinus disease. 8. Aortic Atherosclerosis (ICD10-I70.0). Electronically Signed   By: San Morelle M.D.   On: 01/16/2020 19:36   CT CHEST W CONTRAST  Result Date: 01/16/2020 CLINICAL DATA:  Dysphagia and weight loss EXAM: CT CHEST WITH CONTRAST TECHNIQUE: Multidetector CT imaging of the chest was performed during intravenous contrast administration. CONTRAST:  69mL OMNIPAQUE IOHEXOL 300 MG/ML  SOLN COMPARISON:  October 8th 2011 FINDINGS: Cardiovascular: No significant vascular findings. No central pulmonary embolism. Normal heart size. No pericardial effusion. Atherosclerotic calcifications of the aorta. Mediastinum/Nodes: No enlarged mediastinal, hilar, or axillary lymph nodes. Thyroid gland, and esophagus demonstrate no significant findings. Lungs/Pleura: Multifocal endobronchial debris and mucus plugging most predominant in the LEFT lower lobe.  There is heterogeneous opacification of the LEFT lower lobe, new since prior. Mild centrilobular nodularity in the RIGHT lower  lobe. Mild to moderate centrilobular emphysema. Centrilobular nodularity in the LEFT upper lobe with a representative more focal nodule measuring 4 mm (series 5, image 33). Subpleural nodule of the RIGHT upper lobe measures 3 mm (series 5, image 88). Irregular nodule along the RIGHT paramediastinal border measures 4 mm (series 5, image 102). Upper Abdomen: Revisualization of moderate intrahepatic biliary ductal dilation, unchanged in comparison to prior. Musculoskeletal: Gynecomastia.  No acute osseous abnormality. IMPRESSION: 1. Multifocal endobronchial debris and mucus plugging most with new heterogeneous opacification of the LEFT lower lobe. Findings are concerning for aspiration and aspiration pneumonitis. 2. Centrilobular nodularity in the LEFT upper lobe and RIGHT lower lobe, likely infectious or inflammatory. 3. Scattered pulmonary nodules measuring up to 4 mm. Recommend follow-up CT in 1 year to assess for stability. Aortic Atherosclerosis (ICD10-I70.0) and Emphysema (ICD10-J43.9). Electronically Signed   By: Valentino Saxon MD   On: 01/16/2020 19:13   ECHOCARDIOGRAM COMPLETE  Result Date: 01/17/2020 IMPRESSIONS  1. Left ventricular ejection fraction, by estimation, is 55 to 60%. The left ventricle has normal function. The left ventricle has no regional wall motion abnormalities. Left ventricular diastolic parameters are consistent with Grade I diastolic dysfunction (impaired relaxation).  2. Right ventricular systolic function is normal. The right ventricular size is normal. There is moderately elevated pulmonary artery systolic pressure.  3. Left atrial size was mild to moderately dilated.  4. Right atrial size was mild to moderately dilated.  5. The mitral valve is grossly normal. No evidence of mitral valve regurgitation.  6. The aortic valve is tricuspid. Aortic valve regurgitation is mild. Comparison(s): No prior Echocardiogram. Electronically signed by Rudean Haskell MD Signature  Date/Time: 01/17/2020/1:59:17 PM    Final      IMPRESSION:   *   Epigastric pain, elevated alk phos.  Retained/occluded CBD stent placed 2010.  Not functional with ongoing intra and extrahepatic as well as pancreatic duct dilatation, stones in common hepatic and common bile duct.. WBCs 19.6, 2 days ago. On Maxipime, Flagyl.    *    Dysphagia.   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  Areas of decreased vascularity, white plaques and pallor in esophagus at 20 to 25 cm from incisors, biopsies obtained. Experienced significant desaturation during the procedure.  *    Normocytic anemia.  Hb 8.8 two d ago.    *    Hypoxia, desaturation during EGD.  Since arrival to [facility] has had excellent O2 sats on 2.5 L [place] oxygen.   On Maxipime, metronidazole.. Aspiration pneumonitis suspected, scattered pulmonary nodules.  Based on CT.    PLAN:     *   ERCP on Wed 10/13 w Dr Rush Landmark.  Continue Maxipime.    *     Ordered repeat CBC to reassess Hgb and leukocytosis.  Also ordered PT/INR and c-Met.  *    Await reading of pathology specimens collected at EGD.  *  Ok for ordered soft diet.      Azucena Freed  01/18/2020, 11:00 AM Phone [phone]

## 2020-01-18 NOTE — Evaluation (Addendum)
Physical Therapy Evaluation Patient Details Name: Kenneth Coleman MRN: [id number] DOB: November 25, 1955 Today's Date: 01/18/2020   History of Present Illness  64 year old male with a history of COPD, chronic respiratory failure on 2.5 L, hypertension, coronary disease, CHF presenting with dysphagia, abdominal pain, weight loss, and generalized weakness.   Clinical Impression  Pt admitted with above diagnosis. Pt reports that he has had 30-40 lb wt loss over past several months and progressive difficulty swallowing. He has also experienced generalized weakness and fatigue. Pt safe with bed mobility and transfers. Ambulated 500' pushing IV pole but needed several standing rest breaks due to fatigue and abdominal discomfort. Do not anticipate him needing PT follow up after d/c but will follow acutely to increase activity while hospitalized.  Pt currently with functional limitations due to the deficits listed below (see PT Problem List). Pt will benefit from skilled PT to increase their independence and safety with mobility to allow discharge to the venue listed below.       Follow Up Recommendations No PT follow up    Equipment Recommendations  None recommended by PT    Recommendations for Other Services Speech consult (for swallow)     Precautions / Restrictions Precautions Precautions: None Restrictions Weight Bearing Restrictions: No      Mobility  Bed Mobility Overal bed mobility: Modified Independent                Transfers Overall transfer level: Modified independent Equipment used: None             General transfer comment: pt able to stand safely from bed  Ambulation/Gait Ambulation/Gait assistance: Min guard Gait Distance (Feet): 500 Feet Assistive device: IV Pole Gait Pattern/deviations: Decreased stride length Gait velocity: decreased Gait velocity interpretation: 1.31 - 2.62 ft/sec, indicative of limited community ambulator General Gait Details: pt glad to  be up and walking but experienced increased abdominal pain with increased distance. Guarded gait pattern noted   Stairs            Wheelchair Mobility    Modified Rankin (Stroke Patients Only)       Balance Overall balance assessment: Mild deficits observed, not formally tested                                           Pertinent Vitals/Pain Pain Assessment: 0-10 Pain Score: 5  Pain Location: abdomen Pain Descriptors / Indicators: Constant;Grimacing;Discomfort Pain Intervention(s): Premedicated before session;Limited activity within patient's tolerance    Home Living Family/patient expects to be discharged to:: Private residence Living Arrangements: Non-relatives/Friends Available Help at Discharge: Friend(s);Available 24 hours/day Type of Home: Mobile home Home Access: Ramped entrance     Home Layout: One level Home Equipment: None Additional Comments: pt's "friend" that he lives with is disabled and has equipment but he does not. 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 Has had progressive difficulty with swallowing.      Hand Dominance        Extremity/Trunk Assessment   Upper Extremity Assessment Upper Extremity Assessment: Generalized weakness    Lower Extremity Assessment Lower Extremity Assessment: Generalized weakness    Cervical / Trunk Assessment Cervical / Trunk Assessment: Normal  Communication  Communication: No difficulties  Cognition Arousal/Alertness: Awake/alert Behavior During Therapy: WFL for tasks assessed/performed Overall Cognitive Status: Within Functional Limits for tasks assessed                                        General Comments General comments (skin integrity, edema, etc.): Pt ambulated on 2 L O2, O2 sats remained in mid  90's, HR stable. Instructed pt to ambulate 2+ more times with nursing staff.     Exercises     Assessment/Plan    PT Assessment Patient needs continued PT services  PT Problem List Decreased mobility;Pain;Decreased strength;Decreased activity tolerance       PT Treatment Interventions Gait training;Functional mobility training;Therapeutic activities;Therapeutic exercise;Patient/family education    PT Goals (Current goals can be found in the Care Plan section)  Acute Rehab PT Goals Patient Stated Goal: figure out why he's having trouble swallowing PT Goal Formulation: With patient Time For Goal Achievement: 02/01/20 Potential to Achieve Goals: Good    Frequency Min 3X/week   Barriers to discharge        Co-evaluation               AM-PAC PT "6 Clicks" Mobility  Outcome Measure Help needed turning from your back to your side while in a flat bed without using bedrails?: None Help needed moving from lying on your back to sitting on the side of a flat bed without using bedrails?: None Help needed moving to and from a bed to a chair (including a wheelchair)?: None Help needed standing up from a chair using your arms (e.g., wheelchair or bedside chair)?: None Help needed to walk in hospital room?: A Little Help needed climbing 3-5 steps with a railing? : A Little 6 Click Score: 22    End of Session   Activity Tolerance: Patient tolerated treatment well Patient left: in bed;with call bell/phone within reach Nurse Communication: Mobility status PT Visit Diagnosis: Pain;Muscle weakness (generalized) (M62.81) Pain - part of body:  (abdomen)    Time: 3545-6256 PT Time Calculation (min) (ACUTE ONLY): 19 min   Charges:   PT Evaluation $PT Eval Moderate Complexity: [place], [place]  Pager [phone] Office [place] 01/18/2020, 1:41 PM

## 2020-01-19 DIAGNOSIS — K831 Obstruction of bile duct: Secondary | ICD-10-CM | POA: Diagnosis not present

## 2020-01-19 DIAGNOSIS — R1319 Other dysphagia: Secondary | ICD-10-CM | POA: Diagnosis not present

## 2020-01-19 DIAGNOSIS — K8050 Calculus of bile duct without cholangitis or cholecystitis without obstruction: Secondary | ICD-10-CM | POA: Diagnosis not present

## 2020-01-19 LAB — CBC
HCT: 28.5 % — ABNORMAL LOW (ref 39.0–52.0)
Hemoglobin: 8.7 g/dL — ABNORMAL LOW (ref 13.0–17.0)
MCH: 25.4 pg — ABNORMAL LOW (ref 26.0–34.0)
MCHC: 30.5 g/dL (ref 30.0–36.0)
MCV: 83.1 fL (ref 80.0–100.0)
Platelets: 485 10*3/uL — ABNORMAL HIGH (ref 150–400)
RBC: 3.43 MIL/uL — ABNORMAL LOW (ref 4.22–5.81)
RDW: 15.2 % (ref 11.5–15.5)
WBC: 7.6 10*3/uL (ref 4.0–10.5)
nRBC: 0 % (ref 0.0–0.2)

## 2020-01-19 LAB — COMPREHENSIVE METABOLIC PANEL
ALT: 75 U/L — ABNORMAL HIGH (ref 0–44)
AST: 100 U/L — ABNORMAL HIGH (ref 15–41)
Albumin: 1.9 g/dL — ABNORMAL LOW (ref 3.5–5.0)
Alkaline Phosphatase: 657 U/L — ABNORMAL HIGH (ref 38–126)
Anion gap: 6 (ref 5–15)
BUN: <5 mg/dL — ABNORMAL LOW (ref 8–23)
CO2: 28 mmol/L (ref 22–32)
Calcium: 8.3 mg/dL — ABNORMAL LOW (ref 8.9–10.3)
Chloride: 100 mmol/L (ref 98–111)
Creatinine, Ser: 0.41 mg/dL — ABNORMAL LOW (ref 0.61–1.24)
GFR, Estimated: >60 mL/min (ref >60)
Glucose, Bld: 103 mg/dL — ABNORMAL HIGH (ref 70–99)
Potassium: 4.6 mmol/L (ref 3.5–5.1)
Sodium: 134 mmol/L — ABNORMAL LOW (ref 135–145)
Total Bilirubin: 1.9 mg/dL — ABNORMAL HIGH (ref 0.3–1.2)
Total Protein: 6.5 g/dL (ref 6.5–8.1)

## 2020-01-19 LAB — SURGICAL PATHOLOGY

## 2020-01-19 MED ORDER — DOCUSATE SODIUM 100 MG PO CAPS
100.0000 mg | ORAL_CAPSULE | Freq: Every day | ORAL | Status: DC
  Administered 2020-01-19 – 2020-01-25 (×6): 100 mg via ORAL
  Filled 2020-01-19 (×5): qty 1

## 2020-01-19 MED ORDER — INDOMETHACIN 50 MG RE SUPP
100.0000 mg | Freq: Once | RECTAL | Status: DC
  Filled 2020-01-19: qty 2

## 2020-01-19 NOTE — H&P (View-Only) (Signed)
Daily Rounding Note  01/19/2020, 8:37 AM  LOS: 4 days   SUBJECTIVE:   Chief complaint:  embedded biliary stent.     C/o some ongoing epigastric pain, same as he's had for > 2 months.  Dilaudid is controlling this.  Tolerating solid food.  Last BM was 3 d ago, normallly daily BM's.  No nausea.  Overall feels well  OBJECTIVE:         Vital signs in last 24 hours:    Temp:  [97.5 F (36.4 C)-97.8 F (36.6 C)] 97.6 F (36.4 C) (10/12 0356) Pulse Rate:  [55-63] 57 (10/12 0356) Resp:  [16-17] 16 (10/12 0356) BP: (122-134)/(60-72) 134/72 (10/12 0356) SpO2:  [98 %-100 %] 100 % (10/12 0356) Last BM Date: 01/17/20 Filed Weights   01/17/20 1100  Weight: 70.3 kg   General: looks thin, alert, comfortable   Heart: RRR Chest: diminshed BS bil, R >> left.  No adventitious sounds.  No dyspnea Abdomen: soft, tender at epigastrum.  Active BS  Extremities: no CCE Neuro/Psych:  Alert.  Appropriate.  Oriented x 3.    Intake/Output from previous day: 10/11 0701 - 10/12 0700 In: 1582.9 [P.O.:440; I.V.:742.9; IV Piggyback:400] Out: 2350 [Urine:2350]  Intake/Output this shift: Total I/O In: -  Out: 425 [Urine:425]  Lab Results: Recent Labs    01/16/20 1752 01/18/20 1154 01/19/20 0141  WBC 19.6* 7.1 7.6  HGB 8.8* 8.5* 8.7*  HCT 29.2* 29.0* 28.5*  PLT 476* 454* 485*   BMET Recent Labs    01/17/20 0605 01/18/20 1154 01/19/20 0141  NA 133* 133* 134*  K 3.9 4.7 4.6  CL 97* 98 100  CO2 30 29 28   GLUCOSE 97 83 103*  BUN 7* <5* <5*  CREATININE 0.33* 0.38* 0.41*  CALCIUM 8.3* 8.2* 8.3*   LFT Recent Labs    01/17/20 0605 01/18/20 1154 01/19/20 0141  PROT 6.6 6.4* 6.5  ALBUMIN 2.1* 1.8* 1.9*  AST 63* 109* 100*  ALT 56* 78* 75*  ALKPHOS 522* 659* 657*  BILITOT 2.1* 2.3* 1.9*   PT/INR Recent Labs    01/18/20 1154  LABPROT 12.8  INR 1.0   Hepatitis Panel No results for input(s): HEPBSAG, HCVAB, HEPAIGM,  HEPBIGM in the last 72 hours.  Studies/Results: ECHOCARDIOGRAM COMPLETE  Result Date: 01/17/2020 IMPRESSIONS  1. Left ventricular ejection fraction, by estimation, is 55 to 60%. The left ventricle has normal function. The left ventricle has no regional wall motion abnormalities. Left ventricular diastolic parameters are consistent with Grade I diastolic dysfunction (impaired relaxation).  2. Right ventricular systolic function is normal. The right ventricular size is normal. There is moderately elevated pulmonary artery systolic pressure.  3. Left atrial size was mild to moderately dilated.  4. Right atrial size was mild to moderately dilated.  5. The mitral valve is grossly normal. No evidence of mitral valve regurgitation.  6. The aortic valve is tricuspid. Aortic valve regurgitation is mild.  Electronically signed by Rudean Haskell MD Signature Date/Time: 01/17/2020/1:59:17 PM    Final     ASSESMENT:   *   Occluded and likely embedded biliary stent, originally placed 2010 in [place]. Imaging w GB stone, suspected CHD stone, dilated PD and intr/extra hepatic ducts.    *   Dysphagia.  Improved.  Tolerating soft diet.  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    *  [place] anemia.  Hgb stable.     *    PNA.  Aspiration suspected.  On Maxipime, Flagyl.     *   Protein cal malnutrition of unclear severity.  On Ensure.     PLAN   *   ERCP to address the stent tmrw at 1215 PM.    *   Adding colace.  NPO after midnight.  No DVT prophylaxis for now.  Last Lovenox was yest afternoon.      Azucena Freed  01/19/2020, 8:37 AM Phone [phone]

## 2020-01-19 NOTE — Progress Notes (Signed)
PROGRESS NOTE    Kenneth Coleman  IEP:[id number] DOB: 18-Aug-1955 DOA: 01/15/2020 PCP: System, Provider Not In    Brief Narrative: HPI per Dr. Carles Collet 01/17/2020 64 year old male with a history of COPD, chronic respiratory failure on 2.5 L, hypertension, coronary disease, CHF presenting with dysphagia, abdominal pain, weight loss, and generalized weakness. The patient is a poor historian. Most of the information is obtained from review of medical records. The patient has had progressive dysphagia to solid food over the last 2 months. He has had decreased oral intake. He has been drinking Ensure as much as possible. He endorses a 40 pound weight loss over the past few months. In addition, he has been having intermittent episodes of epigastric and right upper quadrant pain exacerbated by eating. He denies any actual vomiting, nausea, fevers, chills, abdominal distention, hematochezia, melena, dysuria, hematuria. He is having normal bowel movements. Apparently, the patient was recently admitted to [facility] in [place] and [place] where he had a 5-day hospital stay. A CT of the abdomen and pelvis at that time showed dilated intrahepatic and extrahepatic biliary ducts secondary to choledocholithiasis. There was no on-call gastroenterology at that facility. The patient was ultimately discharged from the hospital with instructions for GI follow-up. He did not follow-up with GI. Because of abdominal pain, he went to [facility] department on 01/13/2020. They recommended transfer to a tertiary care facility. However, the patient left AGAINST MEDICAL ADVICE. Nevertheless, he continued to have dysphagia, generalized weakness, and intermittent abdominal pain. As result, he presented to [facility] further evaluation. Imaging at [facility] also showed circumferential wall thickening of the proximal esophagus concerning for esophagitis or infiltrative process. There is also concern for a periapical  lucency and area of bony destruction in the left maxilla. In the emergency department, the patient had oxygen saturation 88 to 91% on 6 L. He was afebrile with soft blood pressures. GI was consulted and is planning for endoscopy. BMP showed potassium 2.7. AST 90, ALT 73, alk phosphatase 681, total bilirubin 2.6, lipase 21. WBC 14.8, hemoglobin 10.2, platelets 5 86,000. CT of the abdomen and pelvis showed moderate intrahepatic and extrahepatic biliary ductal dilatation. There was a plastic biliary stent in place. There is possible calculus in the common hepatic duct adjacent to the stent. There is also pancreatic ductal dilatation. Right upper quadrant ultrasound showed moderate intrahepatic and extrahepatic biliary duct dilatation with biliary stent. There is also a probable intraductal filling defect adjacent to the proximal aspect of the stent consistent with choledocholithiasis.  Assessment & Plan:   Principal Problem:   Choledocholithiasis Active Problems:   CHF (congestive heart failure) (HCC)   COPD (chronic obstructive pulmonary disease) (HCC)   Coronary artery disease   Hypertension   Hypokalemia   Acute on chronic respiratory failure with hypoxia (HCC)   Lobar pneumonia (HCC)   Bile obstruction   Esophageal dysphagia  #1 choledocholithiasis- ultrasound 01/15/2020 shows gallbladder stone and dilated common hepatic duct with 11 mm stone could be causing filling defect CBD 5 mm biliary stent is not well visualized. CT abdomen and pelvis 01/15/2019 21 moderate intra and extrahepatic biliary ductal dilatation concerning for recurrent biliary obstruction.  Plastic stent in place.  Nonfunctional.  11 mm soft tissue nodule of the hepatic hilum could be intrabiliary.  Pancreatic duct is also dilated.  Possible stone in the common hepatic duct adjacent to the stent versus stent encrustation. LFTs trending down.   EGD at [facility] 01/16/2020 areas of white plaques and pallor which was  biopsied,  nonbleeding 2 duodenal AVMs and moderate hiatal hernia. Patient was transferred to [facility] for ERCP   #2 dysphagia/esophageal thickening concerning for malignancy or other infiltrative process.  CT of the head and neck done 12/10/2019 shows proximal esophageal thickening. EGD 01/16/2020 as above  #3 acute on chronic hypoxic respiratory failure secondary to aspiration pneumonitis suspected.  CT of the chest show scattered pulmonary nodules. Patient is on 2.5 L of oxygen at baseline.  Oxygen requirement went up to 4 L with saturation 88 to 91% prior to transfer from [facility].  This is thought to be likely secondary to aspiration pneumonitis in the setting of underlying severe COPD.  He was Covid negative.  Procalcitonin was 1.73 D-dimer was 0.84.   CT chest shows multifocal endobronchial debris's and mucous plugging most with new heterogeneous opacification of the left lower lobe.  Nodularity in the left upper lobe and right lower lobe likely infectious or inflammatory. Continue cefepime and Flagyl.  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  #5 chronic diastolic CHF patient appears euvolemic ejection fraction 55 to 60% with no wall motion abnormalities and grade 1 diastolic dysfunction.   #6 failure to thrive with over 30 pound weight loss recently.  Work-up included normal TSH folate and B12.  PT eval is pending.  Estimated body mass index is 22.89 kg/m as calculated from the following:   Height as of this encounter: $RemoveBeforeD'5\' 9"'GAfdRDdeczppVP$  (1.753 m).   Weight as of this encounter: 70.3 kg.  DVT prophylaxis: SCD Code Status: Full code  family Communication: None at bedside  disposition Plan:  Status is: Inpatient  Dispo: The patient is from: Home              Anticipated d/c is to: May need SNF PT pending              Anticipated d/c date is: 3 days              Patient currently is not medically stable to d/c.   Consultants:   GI  Procedures: EGD  01/17/2020 Antimicrobials: Maxipime and Flagyl  Subjective: He continues to complain of abdominal pain more in the epigastrium and right upper quadrant.  No nausea vomiting.  He ate some soft food yesterday which she kept it down.  Asking for pain medications every 2 hours he is on Dilaudid. Objective: Vitals:   01/18/20 2024 01/18/20 2036 01/18/20 2208 01/19/20 0356  BP:  122/60  134/72  Pulse:  (!) 57 (!) 55 (!) 57  Resp:  16  16  Temp:  97.8 F (36.6 C)  97.6 F (36.4 C)  TempSrc:  Oral  Oral  SpO2: 98% 100%  100%  Weight:      Height:        Intake/Output Summary (Last 24 hours) at 01/19/2020 1153 Last data filed at 01/19/2020 1137 Gross per 24 hour  Intake 1822.93 ml  Output 2850 ml  Net -1027.07 ml   Filed Weights   01/17/20 1100  Weight: 70.3 kg    Examination:  General exam: Appears calm and comfortable  Respiratory system: Clear to auscultation. Respiratory effort normal. Cardiovascular system: S1 & S2 heard, RRR. No JVD, murmurs, rubs, gallops or clicks. No pedal edema. Gastrointestinal system: Abdomen is nondistended, soft and epigastric tender. No organomegaly or masses felt. Normal bowel sounds heard. Central nervous system: Alert and oriented. No focal neurological deficits. Extremities: Symmetric 5 x 5 power. Skin: No rashes, lesions or ulcers Psychiatry: Judgement  and insight appear normal. Mood & affect appropriate.     Data Reviewed: I have personally reviewed following labs and imaging studies  CBC: Recent Labs  Lab 01/15/20 1336 01/16/20 1752 01/18/20 1154 01/19/20 0141  WBC 14.8* 19.6* 7.1 7.6  NEUTROABS 12.4*  --   --   --   HGB 10.2* 8.8* 8.5* 8.7*  HCT 33.1* 29.2* 29.0* 28.5*  MCV 82.1 84.9 84.1 83.1  PLT 586* 476* 454* 737*   Basic Metabolic Panel: Recent Labs  Lab 01/15/20 1336 01/16/20 0443 01/17/20 0605 01/18/20 1154 01/19/20 0141  NA 134* 136 133* 133* 134*  K 2.7* 3.6 3.9 4.7 4.6  CL 88* 96* 97* 98 100  CO2 32 $Remo'31 30  29 28  'pxMlc$ GLUCOSE 103* 73 97 83 103*  BUN 15 10 7* <5* <5*  CREATININE 0.39* 0.35* 0.33* 0.38* 0.41*  CALCIUM 8.7* 8.4* 8.3* 8.2* 8.3*  MG 2.0  --   --   --   --    GFR: Estimated Creatinine Clearance: 91.5 mL/min (A) (by C-G formula based on SCr of 0.41 mg/dL (L)). Liver Function Tests: Recent Labs  Lab 01/15/20 1336 01/16/20 0443 01/17/20 0605 01/18/20 1154 01/19/20 0141  AST 90* 73* 63* 109* 100*  ALT 73* 62* 56* 78* 75*  ALKPHOS 681* 547* 522* 659* 657*  BILITOT 2.6* 2.6* 2.1* 2.3* 1.9*  PROT 7.7 6.8 6.6 6.4* 6.5  ALBUMIN 2.6* 2.2* 2.1* 1.8* 1.9*   Recent Labs  Lab 01/15/20 1336  LIPASE 21   No results for input(s): AMMONIA in the last 168 hours. Coagulation Profile: Recent Labs  Lab 01/18/20 1154  INR 1.0   Cardiac Enzymes: No results for input(s): CKTOTAL, CKMB, CKMBINDEX, TROPONINI in the last 168 hours. BNP (last 3 results) No results for input(s): PROBNP in the last 8760 hours. HbA1C: No results for input(s): HGBA1C in the last 72 hours. CBG: No results for input(s): GLUCAP in the last 168 hours. Lipid Profile: No results for input(s): CHOL, HDL, LDLCALC, TRIG, CHOLHDL, LDLDIRECT in the last 72 hours. Thyroid Function Tests: No results for input(s): TSH, T4TOTAL, FREET4, T3FREE, THYROIDAB in the last 72 hours. Anemia Panel: No results for input(s): VITAMINB12, FOLATE, FERRITIN, TIBC, IRON, RETICCTPCT in the last 72 hours. Sepsis Labs: Recent Labs  Lab 01/16/20 1012  PROCALCITON 1.73    Recent Results (from the past 240 hour(s))  Resp Panel by RT PCR (RSV, Flu A&B, Covid) - Nasopharyngeal Swab     Status: None   Collection Time: 01/15/20  1:48 PM   Specimen: Nasopharyngeal Swab  Result Value Ref Range Status   SARS Coronavirus 2 by RT PCR NEGATIVE NEGATIVE Final    Comment: (NOTE) SARS-CoV-2 target nucleic acids are NOT DETECTED.  The SARS-CoV-2 RNA is generally detectable in upper respiratoy specimens during the acute phase of infection. The  lowest concentration of SARS-CoV-2 viral copies this assay can detect is 131 copies/mL. A negative result does not preclude SARS-Cov-2 infection and should not be used as the sole basis for treatment or other patient management decisions. A negative result may occur with  improper specimen collection/handling, submission of specimen other than nasopharyngeal swab, presence of viral mutation(s) within the areas targeted by this assay, and inadequate number of viral copies (<131 copies/mL). A negative result must be combined with clinical observations, patient history, and epidemiological information. The expected result is Negative.  Fact Sheet for Patients:  PinkCheek.be  Fact Sheet for Healthcare Providers:  GravelBags.it  This test is no t  yet approved or cleared by the [place] and  has been authorized for detection and/or diagnosis of SARS-CoV-2 by FDA under an Emergency Use Authorization (EUA). This EUA will remain  in effect (meaning this test can be used) for the duration of the COVID-19 declaration under Section 564(b)(1) of the Act, 21 U.S.C. section 360bbb-3(b)(1), unless the authorization is terminated or revoked sooner.     Influenza A by PCR NEGATIVE NEGATIVE Final   Influenza B by PCR NEGATIVE NEGATIVE Final    Comment: (NOTE) The Xpert Xpress SARS-CoV-2/FLU/RSV assay is intended as an aid in  the diagnosis of influenza from Nasopharyngeal swab specimens and  should not be used as a sole basis for treatment. Nasal washings and  aspirates are unacceptable for Xpert Xpress SARS-CoV-2/FLU/RSV  testing.  Fact Sheet for Patients: PinkCheek.be  Fact Sheet for Healthcare Providers: GravelBags.it  This test is not yet approved or cleared by the [place] FDA and  has been authorized for detection and/or diagnosis of SARS-CoV-2 by  FDA under  an Emergency Use Authorization (EUA). This EUA will remain  in effect (meaning this test can be used) for the duration of the  Covid-19 declaration under Section 564(b)(1) of the Act, 21  U.S.C. section 360bbb-3(b)(1), unless the authorization is  terminated or revoked.    Respiratory Syncytial Virus by PCR NEGATIVE NEGATIVE Final    Comment: (NOTE) Fact Sheet for Patients: PinkCheek.be  Fact Sheet for Healthcare Providers: GravelBags.it  This test is not yet approved or cleared by the [place] FDA and  has been authorized for detection and/or diagnosis of SARS-CoV-2 by  FDA under an Emergency Use Authorization (EUA). This EUA will remain  in effect (meaning this test can be used) for the duration of the  COVID-19 declaration under Section 564(b)(1) of the Act, 21 U.S.C.  section 360bbb-3(b)(1), unless the authorization is terminated or  revoked. Performed at [facility], [address] [place], [place] [address]   MRSA PCR Screening     Status: Abnormal   Collection Time: 01/17/20 12:58 PM   Specimen: Nasopharyngeal  Result Value Ref Range Status   MRSA by PCR POSITIVE (A) NEGATIVE Final    Comment:        The GeneXpert MRSA Assay (FDA approved for NASAL specimens only), is one component of a comprehensive MRSA colonization surveillance program. It is not intended to diagnose MRSA infection nor to guide or monitor treatment for MRSA infections. RESULT CALLED TO, READ BACK BY AND VERIFIED WITH: L BULLINS AT 1723 ON 01/17/2020 BY MOSLEY,J Performed at [facility], [address] [place], [place] [address]          Radiology Studies: ECHOCARDIOGRAM COMPLETE  Result Date: 01/17/2020    ECHOCARDIOGRAM REPORT   Patient Name:   Chaney Glahn Date of Exam: 01/17/2020 Medical Rec #:  [id number]      Height:       69.0 in Accession #:    [id number]     Weight:       155.0 lb Date of Birth:  1955-09-26      BSA:           1.854 m Patient Age:    17 years       BP:           125/58 mmHg Patient Gender: M              HR:           70 bpm.  Exam Location:  Inpatient Procedure: 2D Echo Indications:    CHF- Acute Systolic Z61.09  History:        Patient has no prior history of Echocardiogram examinations.                 CAD, COPD; Risk Factors:Hypertension.  Sonographer:    Mikki Santee RDCS (AE) Referring Phys: [phone] Jacqueline TAT IMPRESSIONS  1. Left ventricular ejection fraction, by estimation, is 55 to 60%. The left ventricle has normal function. The left ventricle has no regional wall motion abnormalities. Left ventricular diastolic parameters are consistent with Grade I diastolic dysfunction (impaired relaxation).  2. Right ventricular systolic function is normal. The right ventricular size is normal. There is moderately elevated pulmonary artery systolic pressure.  3. Left atrial size was mild to moderately dilated.  4. Right atrial size was mild to moderately dilated.  5. The mitral valve is grossly normal. No evidence of mitral valve regurgitation.  6. The aortic valve is tricuspid. Aortic valve regurgitation is mild. Comparison(s): No prior Echocardiogram. FINDINGS  Left Ventricle: Left ventricular ejection fraction, by estimation, is 55 to 60%. The left ventricle has normal function. The left ventricle has no regional wall motion abnormalities. The left ventricular internal cavity size was normal in size. There is  no left ventricular hypertrophy. Left ventricular diastolic parameters are consistent with Grade I diastolic dysfunction (impaired relaxation). Right Ventricle: The right ventricular size is normal. No increase in right ventricular wall thickness. Right ventricular systolic function is normal. There is moderately elevated pulmonary artery systolic pressure. The tricuspid regurgitant velocity is 3.29 m/s, and with an assumed right atrial pressure of 8 mmHg, the estimated right ventricular systolic pressure  is 40.9 mmHg. Left Atrium: Left atrial size was mild to moderately dilated. Right Atrium: Right atrial size was mild to moderately dilated. Pericardium: There is no evidence of pericardial effusion. Mitral Valve: The mitral valve is grossly normal. No evidence of mitral valve regurgitation. Tricuspid Valve: The tricuspid valve is grossly normal. Tricuspid valve regurgitation is mild. Aortic Valve: Veno contracta 3 mm, even at post ectopic beat. The aortic valve is tricuspid. Aortic valve regurgitation is mild. Aortic regurgitation PHT measures 691 msec. Pulmonic Valve: The pulmonic valve was not well visualized. Pulmonic valve regurgitation is not visualized. Aorta: The aortic root is normal in size and structure. IAS/Shunts: The atrial septum is grossly normal.  LEFT VENTRICLE PLAX 2D LVIDd:         4.58 cm  Diastology LVIDs:         3.13 cm  LV e' medial:    8.32 cm/s LV PW:         1.13 cm  LV E/e' medial:  10.0 LV IVS:        0.93 cm  LV e' lateral:   10.50 cm/s LVOT diam:     2.00 cm  LV E/e' lateral: 7.9 LV SV:         64 LV SV Index:   35 LVOT Area:     3.14 cm  RIGHT VENTRICLE RV S prime:     10.90 cm/s TAPSE (M-mode): 1.6 cm LEFT ATRIUM             Index       RIGHT ATRIUM           Index LA diam:        3.40 cm 1.83 cm/m  RA Area:     19.70 cm LA Vol (A2C):   80.6 ml  43.48 ml/m RA Volume:   57.40 ml  30.96 ml/m LA Vol (A4C):   43.2 ml 23.30 ml/m LA Biplane Vol: 58.8 ml 31.72 ml/m  AORTIC VALVE LVOT Vmax:   109.00 cm/s LVOT Vmean:  70.500 cm/s LVOT VTI:    0.205 m AI PHT:      691 msec  AORTA Ao Root diam: 2.70 cm MITRAL VALVE               TRICUSPID VALVE MV Area (PHT): 3.19 cm    TR Peak grad:   43.3 mmHg MV Decel Time: 238 msec    TR Vmax:        329.00 cm/s MV E velocity: 83.20 cm/s MV A velocity: 93.00 cm/s  SHUNTS MV E/A ratio:  0.89        Systemic VTI:  0.20 m                            Systemic Diam: 2.00 cm Rudean Haskell MD Electronically signed by Rudean Haskell MD Signature  Date/Time: 01/17/2020/1:59:17 PM    Final         Scheduled Meds: . budesonide (PULMICORT) nebulizer solution  0.5 mg Nebulization BID  . carvedilol  6.25 mg Oral BID  . Chlorhexidine Gluconate Cloth  6 each Topical Q0600  . digoxin  125 mcg Oral Daily  . docusate sodium  100 mg Oral Daily  . feeding supplement (ENSURE ENLIVE)  237 mL Oral TID BM  . mupirocin ointment  1 application Nasal BID  . potassium chloride  40 mEq Oral BID  . spironolactone  25 mg Oral Daily   Continuous Infusions: . sodium chloride 50 mL/hr at 01/19/20 0100  . ceFEPime (MAXIPIME) IV 2 g (01/19/20 0504)  . metronidazole 500 mg (01/19/20 0553)     LOS: 4 days     Georgette Shell, MD 01/19/2020, 11:53 AM

## 2020-01-19 NOTE — Progress Notes (Signed)
Daily Rounding Note  01/19/2020, 8:37 AM  LOS: 4 days   SUBJECTIVE:   Chief complaint:  embedded biliary stent.     C/o some ongoing epigastric pain, same as he's had for > 2 months.  Dilaudid is controlling this.  Tolerating solid food.  Last BM was 3 d ago, normallly daily BM's.  No nausea.  Overall feels well  OBJECTIVE:         Vital signs in last 24 hours:    Temp:  [97.5 F (36.4 C)-97.8 F (36.6 C)] 97.6 F (36.4 C) (10/12 0356) Pulse Rate:  [55-63] 57 (10/12 0356) Resp:  [16-17] 16 (10/12 0356) BP: (122-134)/(60-72) 134/72 (10/12 0356) SpO2:  [98 %-100 %] 100 % (10/12 0356) Last BM Date: 01/17/20 Filed Weights   01/17/20 1100  Weight: 70.3 kg   General: looks thin, alert, comfortable   Heart: RRR Chest: diminshed BS bil, R >> left.  No adventitious sounds.  No dyspnea Abdomen: soft, tender at epigastrum.  Active BS  Extremities: no CCE Neuro/Psych:  Alert.  Appropriate.  Oriented x 3.    Intake/Output from previous day: 10/11 0701 - 10/12 0700 In: 1582.9 [P.O.:440; I.V.:742.9; IV Piggyback:400] Out: 2350 [Urine:2350]  Intake/Output this shift: Total I/O In: -  Out: 425 [Urine:425]  Lab Results: Recent Labs    01/16/20 1752 01/18/20 1154 01/19/20 0141  WBC 19.6* 7.1 7.6  HGB 8.8* 8.5* 8.7*  HCT 29.2* 29.0* 28.5*  PLT 476* 454* 485*   BMET Recent Labs    01/17/20 0605 01/18/20 1154 01/19/20 0141  NA 133* 133* 134*  K 3.9 4.7 4.6  CL 97* 98 100  CO2 30 29 28   GLUCOSE 97 83 103*  BUN 7* <5* <5*  CREATININE 0.33* 0.38* 0.41*  CALCIUM 8.3* 8.2* 8.3*   LFT Recent Labs    01/17/20 0605 01/18/20 1154 01/19/20 0141  PROT 6.6 6.4* 6.5  ALBUMIN 2.1* 1.8* 1.9*  AST 63* 109* 100*  ALT 56* 78* 75*  ALKPHOS 522* 659* 657*  BILITOT 2.1* 2.3* 1.9*   PT/INR Recent Labs    01/18/20 1154  LABPROT 12.8  INR 1.0   Hepatitis Panel No results for input(s): HEPBSAG, HCVAB, HEPAIGM,  HEPBIGM in the last 72 hours.  Studies/Results: ECHOCARDIOGRAM COMPLETE  Result Date: 01/17/2020 IMPRESSIONS  1. Left ventricular ejection fraction, by estimation, is 55 to 60%. The left ventricle has normal function. The left ventricle has no regional wall motion abnormalities. Left ventricular diastolic parameters are consistent with Grade I diastolic dysfunction (impaired relaxation).  2. Right ventricular systolic function is normal. The right ventricular size is normal. There is moderately elevated pulmonary artery systolic pressure.  3. Left atrial size was mild to moderately dilated.  4. Right atrial size was mild to moderately dilated.  5. The mitral valve is grossly normal. No evidence of mitral valve regurgitation.  6. The aortic valve is tricuspid. Aortic valve regurgitation is mild.  Electronically signed by Rudean Haskell MD Signature Date/Time: 01/17/2020/1:59:17 PM    Final     ASSESMENT:   *   Occluded and likely embedded biliary stent, originally placed 2010 in [place]. Imaging w GB stone, suspected CHD stone, dilated PD and intr/extra hepatic ducts.    *   Dysphagia.  Improved.  Tolerating soft diet.  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    *  [place] anemia.  Hgb stable.     *    PNA.  Aspiration suspected.  On Maxipime, Flagyl.     *   Protein cal malnutrition of unclear severity.  On Ensure.     PLAN   *   ERCP to address the stent tmrw at 1215 PM.    *   Adding colace.  NPO after midnight.  No DVT prophylaxis for now.  Last Lovenox was yest afternoon.      Azucena Freed  01/19/2020, 8:37 AM Phone [phone]

## 2020-01-19 NOTE — Consult Note (Signed)
Attestation signed by Kenneth Bears, MD at 01/18/2020 5:25 PM (Updated)  I have taken an interval history, reviewed the chart, and examined the patient. I agree with the Advanced Practitioner's note and impression.  64 year old male with history of oxygen dependent COPD, hypertension, previous biliary intervention/ERCP in 2010 becoming subsequently lost to follow-up now presenting with abnormal liver enzymes consistent with biliary obstruction, dilated bile ducts, retained bile duct stent.  Also complaint of dysphagia.  Recent EGD performed at [facility] on 01-16-2020.  Mucosal abnormalities seen endoscopically and biopsied.  Pathology is pending.  No stricture was noted.  4 cm hiatal hernia.  Stomach was normal.  Duodenal bulb angiectasia x2, nonbleeding.  No mention of second portion of the duodenum or ampulla in this report.  He is also being treated for aspiration pneumonitis with antibiotics.  He is on 2.5 L oxygen at baseline which is up to 4 L currently.  Patient currently denies dyspnea and chest pain.  Patient needs ERCP with removal of biliary stent to relieve biliary obstruction, removal of any biliary stones.  Case reviewed by biliary endoscopists and will plan for ERCP on Wednesday afternoon with Kenneth Coleman.  I have discussed the procedure in detail with the patient including the risk, benefits and alternatives and he is agreeable and wishes to proceed.  I made him aware that this procedure may be somewhat more complex due to presence of the biliary stent for over 10 years.  We will follow-up esophageal biopsies performed at recent upper endoscopy to further evaluate his dysphagia.  May consider EGD prior to ERCP to rule out esophageal stricture.    Expand All Collapse All                                                                                                                                                                           Kenneth Coleman Gastroenterology Consult: 11:00  AM 01/18/2020  LOS: 3 days    Referring Provider: Dr. Zigmund Coleman Primary Care Physician:  System, Provider Not In Primary Gastroenterologist:  Kenneth Coleman    Reason for Consultation: Retained biliary stent.   HPI: Kenneth Coleman is a 64 y.o. male.  Resident of [place].  PMH COPD.  Hypertension.  S/p ERCP with stent placement 09/2008 in [place].  Lost to follow-up. Very recently established care with a primary care provider in [place].  C/o 3 to 4 months of intermittent solid and liquid food dysphagia.  Senses food getting stuck either in the upper or lower esophagus.  Spits up clear mucus but no regurgitation of food.  The dysphagia is intermittent and unpredictable.  Sometimes he can tolerate certain solid foods and then a few hours later experienced dysphagia with the same  foods.  Describes epigastric pain associated with dysphagia in the region of the lower esophagus.  Pain is relieved with oxycodone which he buys off the street.  Has not been using antacids or acid suppressing meds.  Appetite intermittently diminished.  He has lost 40 pounds.  CT chest/ab/pelvis performed in [place] on 9/3 showed proximal esophageal thickening concerning for esophagitis vs malignancy.  There was choledocholithiasis with intra and extrahepatic biliary ductal dilatation.  Seen as GI consult on 10/8 by Kenneth Coleman at [facility].   Abdominal ultrasound 01/15/2020.   Gallbladder stone.  Suspect 11 mm stone in dilated CHD.  CBD 5 mm.  Biliary stent not well visualized.  11 mm filling defect, likely stone, in CBD. Repeat CTAP w contrast 01/15/2020 revealed moderate intra and extrahepatic biliary ductal dilatation concerning for recurrent biliary obstruction.  Plastic stent in place, not functional.  Possible stone in CHD adjacent to the stent vs stent encrustation.  11 mm soft tissue nodule at hepatic hilum, could be intrabiliary.  Pancreatic duct also dilated. Lipase 21.  Alk phos  681 >> 522.  T bili 2.6 >> 2.1.  AST/ALT 90/73 >> 63/56. Na 133.  No renal insufficiency. 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  Areas of decreased vascularity, white plaques and pallor in esophagus at 20 to 25 cm from incisors, biopsies obtained. Experienced significant desaturation during the procedure  Patient's father was 38 when he was born.  He does not know much about his past medical history.  Father had 3 children prior to patient being born, in fact his half-sisters are all older than his mother who was in her 25s when Kenneth Coleman was born.  Pt is an only child.  He is close to his half-sisters and nieces.  Cell hydrocodone looks like he I know Lifetime smoker, currently consumes a pack every 3 days but previously smoked up to 1 pack/day. Has worked in [employer], [employer], Engineer, manufacturing systems in the past. Has a long-term friend, not romantic, who he shares a home with for many years.         Past Medical History:  Diagnosis Date  . CHF (congestive heart failure) ([place])   . COPD (chronic obstructive pulmonary disease) ([place])   . Coronary artery disease   . Hypertension     History reviewed. No pertinent surgical history.         Prior to Admission medications   Medication Sig Start Date End Date Taking? Authorizing Provider  ASPIRIN LOW DOSE 81 MG EC tablet Take 81 mg by mouth daily. 12/22/19  Yes [provider]  BEVESPI AEROSPHERE 9-4.8 MCG/ACT AERO Take 2 puffs by mouth daily.  12/22/19  Yes [provider]  cholecalciferol (VITAMIN D) 25 MCG (1000 UNIT) tablet Take 1,000 Units by mouth daily.   Yes [provider]  furosemide (LASIX) 20 MG tablet Take 20 mg by mouth daily. 12/22/19  Yes [provider]  spironolactone (ALDACTONE) 25 MG tablet Take 25 mg by mouth daily. 12/22/19  Yes [provider]  carvedilol (COREG) 12.5 MG tablet Take 12.5 mg by mouth 2 (two) times daily.  11/18/19   [provider]  digoxin (LANOXIN) 0.125 MG tablet Take 125 mcg by mouth daily. 10/22/19   [provider]  HYDROcodone-acetaminophen (LORTAB) 7.5-500 MG/15ML solution Take 10 mLs by mouth every 6 (six) hours as needed for pain. Patient not taking: Reported on 01/15/2020 12/21/12   Rolland Porter, MD  lisinopril (ZESTRIL) 2.5  MG tablet Take 2.5 mg by mouth at bedtime. 10/06/19   [provider]  penicillin v potassium (VEETID) 250 MG/5ML solution Take 10 mLs (500 mg total) by mouth 4 (four) times daily. Patient not taking: Reported on 01/15/2020 12/21/12   Devoria Albe, MD    Scheduled Meds: . budesonide (PULMICORT) nebulizer solution  0.5 mg Nebulization BID  . carvedilol  6.25 mg Oral BID  . Chlorhexidine Gluconate Cloth  6 each Topical Q0600  . digoxin  125 mcg Oral Daily  . feeding supplement (ENSURE ENLIVE)  237 mL Oral TID BM  . ipratropium-albuterol  3 mL Nebulization Q6H  . mupirocin ointment  1 application Nasal BID  . potassium chloride  40 mEq Oral BID  . spironolactone  25 mg Oral Daily   Infusions: . sodium chloride 50 mL/hr at 01/17/20 2059  . ceFEPime (MAXIPIME) IV 2 g (01/18/20 0506)  . metronidazole 500 mg (01/18/20 0604)   PRN Meds: HYDROmorphone (DILAUDID) injection, ondansetron **OR** ondansetron (ZOFRAN) IV      Allergies as of 01/15/2020  . (No Known Allergies)    History reviewed. No pertinent family history.  Social History        Socioeconomic History  . Marital status: Single    Spouse name: Not on file  . Number of children: Not on file  . Years of education: Not on file  . Highest education level: Not on file  Occupational History  . Not on file  Tobacco Use  . Smoking status: Current Every Day Smoker    Types: Cigarettes  . Smokeless tobacco: Never Used  Vaping Use  . Vaping Use: Never used  Substance and Sexual Activity  . Alcohol use: No  . Drug use: Never  . Sexual activity: Not  Currently  Other Topics Concern  . Not on file  Social History Narrative  . Not on file   Social Determinants of Health      Financial Resource Strain:   . Difficulty of Paying Living Expenses: Not on file  Food Insecurity:   . Worried About Programme researcher, broadcasting/film/video in the Last Year: Not on file  . Ran Out of Food in the Last Year: Not on file  Transportation Needs:   . Lack of Transportation (Medical): Not on file  . Lack of Transportation (Non-Medical): Not on file  Physical Activity:   . Days of Exercise per Week: Not on file  . Minutes of Exercise per Session: Not on file  Stress:   . Feeling of Stress : Not on file  Social Connections:   . Frequency of Communication with Friends and Family: Not on file  . Frequency of Social Gatherings with Friends and Family: Not on file  . Attends Religious Services: Not on file  . Active Member of Clubs or Organizations: Not on file  . Attends Banker Meetings: Not on file  . Marital Status: Not on file  Intimate Partner Violence:   . Fear of Current or Ex-Partner: Not on file  . Emotionally Abused: Not on file  . Physically Abused: Not on file  . Sexually Abused: Not on file    REVIEW OF SYSTEMS: Constitutional: Somewhat tired and weak.  No profound fatigue. ENT:  No nose bleeds Pulm: Chronic cough productive of clear sputum. CV:  No palpitations, no LE edema.  No angina. GU:  No hematuria, no frequency GI: See HPI. Heme: No unusual bleeding or bruising. Transfusions: No previous history of anemia or blood  transfusions. Neuro:  No headaches, no peripheral tingling or numbness.  No syncope, no seizures. Derm:  No itching, no rash or sores.  Endocrine:  No sweats or chills.  No polyuria or dysuria Immunization: Has never received COVID-19 vaccination. Travel:  None beyond local counties in last few months.    PHYSICAL EXAM: Vital signs in last 24 hours:     Vitals:   01/18/20 0957 01/18/20 1017  BP:   130/61  Pulse:  60  Resp:  17  Temp:  97.6 F (36.4 C)  SpO2: 99% 100%      Wt Readings from Last 3 Encounters:  01/17/20 70.3 kg  12/21/12 70.3 kg    General: Thin, somewhat wasted appearing, alert, comfortable.  Looks older than stated age.  Not toxic. Head: No facial asymmetry or swelling. + Temporal wasting. Eyes: No scleral icterus.  Conjunctiva is pale.  EOMI Ears: Not hard of hearing Nose: No congestion or discharge Mouth: Very few teeth remain in though these are in poor repair.  Mucosa is moist, pink, clear.  Tongue is midline. Neck: No JVD, no masses, no thyromegaly Lungs: Few crackles in the right base.  Wet cough.  No dyspnea with speech. Heart: RRR.  No MRG.  S1, S2 present. Abdomen: Soft.  Mild epigastric tenderness without guarding or rebound.  No HSM, masses, bruits, hernias.  Active bowel sounds..   Rectal: Deferred. Musc/Skeltl: No joint redness, swelling or gross deformity. Extremities: No CCE. Neurologic: Alert.  Oriented x3.  Appropriate.  Good historian.  Moves all 4 limbs with no gross weakness.  No tremors Skin: No rash, no sores, no suspicious lesions.  Unable to appreciate jaundice. Nodes: No cervical adenopathy Psych: Cooperative, calm, pleasant.  Intake/Output from previous day: 10/10 0701 - 10/11 0700 In: 2493 [P.O.:220; I.V.:1600.4; IV Piggyback:672.6] Out: 425 [Urine:425] Intake/Output this shift: Total I/O In: 0  Out: 450 [Urine:450]  LAB RESULTS: Recent Labs (last 2 labs)       Recent Labs    01/15/20 1336 01/16/20 1752  WBC 14.8* 19.6*  HGB 10.2* 8.8*  HCT 33.1* 29.2*  PLT 586* 476*     BMET Recent Labs       Lab Results  Component Value Date   NA 133 (L) 01/17/2020   NA 136 01/16/2020   NA 134 (L) 01/15/2020   K 3.9 01/17/2020   K 3.6 01/16/2020   K 2.7 (LL) 01/15/2020   CL 97 (L) 01/17/2020   CL 96 (L) 01/16/2020   CL 88 (L) 01/15/2020   CO2 30 01/17/2020   CO2 31 01/16/2020   CO2 32  01/15/2020   GLUCOSE 97 01/17/2020   GLUCOSE 73 01/16/2020   GLUCOSE 103 (H) 01/15/2020   BUN 7 (L) 01/17/2020   BUN 10 01/16/2020   BUN 15 01/15/2020   CREATININE 0.33 (L) 01/17/2020   CREATININE 0.35 (L) 01/16/2020   CREATININE 0.39 (L) 01/15/2020   CALCIUM 8.3 (L) 01/17/2020   CALCIUM 8.4 (L) 01/16/2020   CALCIUM 8.7 (L) 01/15/2020     LFT Recent Labs (last 2 labs)        Recent Labs    01/15/20 1336 01/16/20 0443 01/17/20 0605  PROT 7.7 6.8 6.6  ALBUMIN 2.6* 2.2* 2.1*  AST 90* 73* 63*  ALT 73* 62* 56*  ALKPHOS 681* 547* 522*  BILITOT 2.6* 2.6* 2.1*     PT/INR Recent Labs  No results found for: INR, PROTIME   Hepatitis Panel Recent Labs (last 2 labs)  No results for input(s): HEPBSAG, HCVAB, HEPAIGM, HEPBIGM in the last 72 hours.   C-Diff Recent Labs  No components found for: CDIFF   Lipase  Labs (Brief)          Component Value Date/Time   LIPASE 21 01/15/2020 1336      Drugs of Abuse  Labs (Brief)  No results found for: LABOPIA, COCAINSCRNUR, LABBENZ, AMPHETMU, THCU, LABBARB     RADIOLOGY STUDIES: CT SOFT TISSUE NECK W CONTRAST  Result Date: 01/16/2020 CLINICAL DATA:  Esophageal mass. EXAM: CT NECK WITH CONTRAST TECHNIQUE: Multidetector CT imaging of the neck was performed using the standard protocol following the bolus administration of intravenous contrast. CONTRAST:  67mL OMNIPAQUE IOHEXOL 300 MG/ML  SOLN COMPARISON:  CT chest scratched at CT neck with contrast 12/21/2012 FINDINGS: Pharynx and larynx: No focal mucosal or submucosal lesions are present. Nasopharynx is clear. Soft palate and tongue base are normal. Epiglottis is normal. The hypopharynx is clear. Vocal cords are midline and symmetric. The trachea is unremarkable. Salivary glands: The submandibular and parotid glands and ducts are within normal limits. Thyroid: Normal Lymph nodes: No significant cervical adenopathy is present. Vascular: Atherosclerotic changes are  noted at the aortic arch great vessel origins the carotid bifurcations bilaterally. Moderate to high-grade stenosis is present on the right. No significant stenosis is present on the left. Limited intracranial: Within normal limits. Visualized orbits: The globes and orbits are within normal limits. Mastoids and visualized paranasal sinuses: Chronic right sphenoid sinus opacification is present. The paranasal sinuses and mastoid air cells are otherwise clear. Skeleton: Multilevel degenerative changes are present cervical spine. Slight degenerative anterolisthesis present at C3-4 associated with uncovertebral spurring and foraminal narrowing bilaterally. Foraminal narrowing is present in the lower cervical spine as well. No focal lytic or blastic lesions are present. Dental caries are present within the residual teeth. Upper chest: The lung apices are clear. Thoracic inlet is normal. There is some thickening of the esophagus without a discrete mass. IMPRESSION: 1. Mild thickening of the upper esophagus without a discrete mass. No pneumomediastinum. 2. No focal mucosal or submucosal lesions. 3. No significant cervical adenopathy. 4. Moderate to high-grade stenosis of the proximal right internal carotid artery. 5. Multilevel degenerative changes of the cervical spine as described. 6. Dental caries. 7. Chronic right sphenoid sinus disease. 8. Aortic Atherosclerosis (ICD10-I70.0). Electronically Signed   By: San Morelle M.D.   On: 01/16/2020 19:36   CT CHEST W CONTRAST  Result Date: 01/16/2020 CLINICAL DATA:  Dysphagia and weight loss EXAM: CT CHEST WITH CONTRAST TECHNIQUE: Multidetector CT imaging of the chest was performed during intravenous contrast administration. CONTRAST:  14mL OMNIPAQUE IOHEXOL 300 MG/ML  SOLN COMPARISON:  October 8th 2011 FINDINGS: Cardiovascular: No significant vascular findings. No central pulmonary embolism. Normal heart size. No pericardial effusion. Atherosclerotic  calcifications of the aorta. Mediastinum/Nodes: No enlarged mediastinal, hilar, or axillary lymph nodes. Thyroid gland, and esophagus demonstrate no significant findings. Lungs/Pleura: Multifocal endobronchial debris and mucus plugging most predominant in the LEFT lower lobe. There is heterogeneous opacification of the LEFT lower lobe, new since prior. Mild centrilobular nodularity in the RIGHT lower lobe. Mild to moderate centrilobular emphysema. Centrilobular nodularity in the LEFT upper lobe with a representative more focal nodule measuring 4 mm (series 5, image 33). Subpleural nodule of the RIGHT upper lobe measures 3 mm (series 5, image 88). Irregular nodule along the RIGHT paramediastinal border measures 4 mm (series 5, image 102). Upper Abdomen: Revisualization of moderate intrahepatic biliary ductal dilation, unchanged  in comparison to prior. Musculoskeletal: Gynecomastia.  No acute osseous abnormality. IMPRESSION: 1. Multifocal endobronchial debris and mucus plugging most with new heterogeneous opacification of the LEFT lower lobe. Findings are concerning for aspiration and aspiration pneumonitis. 2. Centrilobular nodularity in the LEFT upper lobe and RIGHT lower lobe, likely infectious or inflammatory. 3. Scattered pulmonary nodules measuring up to 4 mm. Recommend follow-up CT in 1 year to assess for stability. Aortic Atherosclerosis (ICD10-I70.0) and Emphysema (ICD10-J43.9). Electronically Signed   By: Valentino Saxon MD   On: 01/16/2020 19:13   ECHOCARDIOGRAM COMPLETE  Result Date: 01/17/2020 IMPRESSIONS  1. Left ventricular ejection fraction, by estimation, is 55 to 60%. The left ventricle has normal function. The left ventricle has no regional wall motion abnormalities. Left ventricular diastolic parameters are consistent with Grade I diastolic dysfunction (impaired relaxation).  2. Right ventricular systolic function is normal. The right ventricular size is normal. There is moderately  elevated pulmonary artery systolic pressure.  3. Left atrial size was mild to moderately dilated.  4. Right atrial size was mild to moderately dilated.  5. The mitral valve is grossly normal. No evidence of mitral valve regurgitation.  6. The aortic valve is tricuspid. Aortic valve regurgitation is mild. Comparison(s): No prior Echocardiogram. Electronically signed by Rudean Haskell MD Signature Date/Time: 01/17/2020/1:59:17 PM    Final      IMPRESSION:   *   Epigastric pain, elevated alk phos.  Retained/occluded CBD stent placed 2010.  Not functional with ongoing intra and extrahepatic as well as pancreatic duct dilatation, stones in common hepatic and common bile duct.. WBCs 19.6, 2 days ago. On Maxipime, Flagyl.    *    Dysphagia.   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  Areas of decreased vascularity, white plaques and pallor in esophagus at 20 to 25 cm from incisors, biopsies obtained. Experienced significant desaturation during the procedure.  *    Normocytic anemia.  Hb 8.8 two d ago.    *    Hypoxia, desaturation during EGD.  Since arrival to [facility] has had excellent O2 sats on 2.5 L [place] oxygen.   On Maxipime, metronidazole.. Aspiration pneumonitis suspected, scattered pulmonary nodules.  Based on CT.    PLAN:     *   ERCP on Wed 10/13 w Kenneth Rush Coleman.  Continue Maxipime.    *     Ordered repeat CBC to reassess Hgb and leukocytosis.  Also ordered PT/INR and c-Met.  *    Await reading of pathology specimens collected at EGD.  *  Ok for ordered soft diet.      Azucena Freed  01/18/2020, 11:00 AM Phone [phone]

## 2020-01-19 NOTE — Plan of Care (Signed)

## 2020-01-19 NOTE — Care Management Important Message (Signed)
Important Message  Patient Details  Name: Kenneth Coleman MRN: [id number] Date of Birth: 1955-10-04   Medicare Important Message Given:  Yes     Din Bookwalter Montine Circle 01/19/2020, 10:47 AM

## 2020-01-20 ENCOUNTER — Encounter (HOSPITAL_COMMUNITY)
Admission: EM | Disposition: A | Discharge status: Home Health | Payer: Self-pay | Source: Home / Self Care | Attending: Family Medicine

## 2020-01-20 ENCOUNTER — Inpatient Hospital Stay (HOSPITAL_COMMUNITY): Payer: Medicare Other | Admitting: Anesthesiology

## 2020-01-20 ENCOUNTER — Inpatient Hospital Stay (HOSPITAL_COMMUNITY): Payer: Medicare Other

## 2020-01-20 ENCOUNTER — Encounter (HOSPITAL_COMMUNITY): Payer: Self-pay | Admitting: Internal Medicine

## 2020-01-20 DIAGNOSIS — K31811 Angiodysplasia of stomach and duodenum with bleeding: Secondary | ICD-10-CM

## 2020-01-20 DIAGNOSIS — K922 Gastrointestinal hemorrhage, unspecified: Secondary | ICD-10-CM

## 2020-01-20 DIAGNOSIS — J181 Lobar pneumonia, unspecified organism: Secondary | ICD-10-CM | POA: Diagnosis not present

## 2020-01-20 DIAGNOSIS — Z4659 Encounter for fitting and adjustment of other gastrointestinal appliance and device: Secondary | ICD-10-CM

## 2020-01-20 DIAGNOSIS — J9621 Acute and chronic respiratory failure with hypoxia: Secondary | ICD-10-CM | POA: Diagnosis not present

## 2020-01-20 DIAGNOSIS — K831 Obstruction of bile duct: Secondary | ICD-10-CM | POA: Diagnosis not present

## 2020-01-20 DIAGNOSIS — K8050 Calculus of bile duct without cholangitis or cholecystitis without obstruction: Secondary | ICD-10-CM | POA: Diagnosis not present

## 2020-01-20 HISTORY — PX: REMOVAL OF STONES: SHX5545

## 2020-01-20 HISTORY — PX: HOT HEMOSTASIS: SHX5433

## 2020-01-20 HISTORY — PX: ESOPHAGOGASTRODUODENOSCOPY (EGD) WITH PROPOFOL: SHX5813

## 2020-01-20 HISTORY — PX: ENDOSCOPIC RETROGRADE CHOLANGIOPANCREATOGRAPHY (ERCP) WITH PROPOFOL: SHX5810

## 2020-01-20 HISTORY — PX: HEMOSTASIS CLIP PLACEMENT: SHX6857

## 2020-01-20 HISTORY — PX: STENT REMOVAL: SHX6421

## 2020-01-20 LAB — FERRITIN: Ferritin: 85 ng/mL (ref 24–336)

## 2020-01-20 LAB — COMPREHENSIVE METABOLIC PANEL
ALT: 70 U/L — ABNORMAL HIGH (ref 0–44)
AST: 90 U/L — ABNORMAL HIGH (ref 15–41)
Albumin: 1.9 g/dL — ABNORMAL LOW (ref 3.5–5.0)
Alkaline Phosphatase: 679 U/L — ABNORMAL HIGH (ref 38–126)
Anion gap: 6 (ref 5–15)
BUN: <5 mg/dL — ABNORMAL LOW (ref 8–23)
CO2: 29 mmol/L (ref 22–32)
Calcium: 8.5 mg/dL — ABNORMAL LOW (ref 8.9–10.3)
Chloride: 98 mmol/L (ref 98–111)
Creatinine, Ser: 0.37 mg/dL — ABNORMAL LOW (ref 0.61–1.24)
GFR, Estimated: >60 mL/min (ref >60)
Glucose, Bld: 85 mg/dL (ref 70–99)
Potassium: 4.8 mmol/L (ref 3.5–5.1)
Sodium: 133 mmol/L — ABNORMAL LOW (ref 135–145)
Total Bilirubin: 1.8 mg/dL — ABNORMAL HIGH (ref 0.3–1.2)
Total Protein: 6.7 g/dL (ref 6.5–8.1)

## 2020-01-20 LAB — CBC
HCT: 28.7 % — ABNORMAL LOW (ref 39.0–52.0)
Hemoglobin: 8.8 g/dL — ABNORMAL LOW (ref 13.0–17.0)
MCH: 24.9 pg — ABNORMAL LOW (ref 26.0–34.0)
MCHC: 30.7 g/dL (ref 30.0–36.0)
MCV: 81.1 fL (ref 80.0–100.0)
Platelets: 552 10*3/uL — ABNORMAL HIGH (ref 150–400)
RBC: 3.54 MIL/uL — ABNORMAL LOW (ref 4.22–5.81)
RDW: 15.3 % (ref 11.5–15.5)
WBC: 7.9 10*3/uL (ref 4.0–10.5)
nRBC: 0 % (ref 0.0–0.2)

## 2020-01-20 LAB — IRON AND TIBC
Iron: 42 ug/dL — ABNORMAL LOW (ref 45–182)
Saturation Ratios: 21 % (ref 17.9–39.5)
TIBC: 203 ug/dL — ABNORMAL LOW (ref 250–450)
UIBC: 161 ug/dL

## 2020-01-20 SURGERY — EGD (ESOPHAGOGASTRODUODENOSCOPY)
Anesthesia: Monitor Anesthesia Care

## 2020-01-20 SURGERY — ENDOSCOPIC RETROGRADE CHOLANGIOPANCREATOGRAPHY (ERCP) WITH PROPOFOL
Anesthesia: General

## 2020-01-20 MED ORDER — SODIUM CHLORIDE 0.9 % IV SOLN: INTRAVENOUS | Status: DC

## 2020-01-20 MED ORDER — INDOMETHACIN 50 MG RE SUPP
RECTAL | Status: DC | PRN
  Administered 2020-01-20: 100 mg via RECTAL

## 2020-01-20 MED ORDER — PHENYLEPHRINE 40 MCG/ML (10ML) SYRINGE FOR IV PUSH (FOR BLOOD PRESSURE SUPPORT)
PREFILLED_SYRINGE | INTRAVENOUS | Status: DC | PRN
  Administered 2020-01-20: 120 ug via INTRAVENOUS

## 2020-01-20 MED ORDER — EPHEDRINE SULFATE-NACL 50-0.9 MG/10ML-% IV SOSY
PREFILLED_SYRINGE | INTRAVENOUS | Status: DC | PRN
  Administered 2020-01-20 (×2): 10 mg via INTRAVENOUS

## 2020-01-20 MED ORDER — GLUCAGON HCL RDNA (DIAGNOSTIC) 1 MG IJ SOLR
INTRAMUSCULAR | Status: AC
  Filled 2020-01-20: qty 1

## 2020-01-20 MED ORDER — SODIUM CHLORIDE 0.9 % IV SOLN
INTRAVENOUS | Status: DC | PRN
  Administered 2020-01-20: 45 mL

## 2020-01-20 MED ORDER — PANTOPRAZOLE SODIUM 40 MG PO TBEC
40.0000 mg | DELAYED_RELEASE_TABLET | Freq: Every day | ORAL | Status: DC
  Administered 2020-01-20: 40 mg via ORAL
  Filled 2020-01-20: qty 1

## 2020-01-20 MED ORDER — CIPROFLOXACIN IN D5W 400 MG/200ML IV SOLN
INTRAVENOUS | Status: AC
  Filled 2020-01-20: qty 200

## 2020-01-20 MED ORDER — INDOMETHACIN 50 MG RE SUPP
RECTAL | Status: AC
  Filled 2020-01-20: qty 2

## 2020-01-20 MED ORDER — FENTANYL CITRATE (PF) 250 MCG/5ML IJ SOLN
INTRAMUSCULAR | Status: DC | PRN
Start: 2020-01-20 — End: 2020-01-20
  Administered 2020-01-20 (×2): 50 ug via INTRAVENOUS

## 2020-01-20 MED ORDER — ONDANSETRON HCL 4 MG/2ML IJ SOLN
INTRAMUSCULAR | Status: DC | PRN
  Administered 2020-01-20: 4 mg via INTRAVENOUS

## 2020-01-20 MED ORDER — OXYCODONE HCL 5 MG PO TABS
5.0000 mg | ORAL_TABLET | ORAL | Status: DC | PRN
  Administered 2020-01-20 (×2): 10 mg via ORAL
  Administered 2020-01-21: 5 mg via ORAL
  Administered 2020-01-21 – 2020-01-22 (×3): 10 mg via ORAL
  Administered 2020-01-22: 5 mg via ORAL
  Administered 2020-01-22 (×2): 10 mg via ORAL
  Administered 2020-01-22: 5 mg via ORAL
  Administered 2020-01-23 – 2020-01-25 (×14): 10 mg via ORAL
  Filled 2020-01-20 (×8): qty 2
  Filled 2020-01-20: qty 1
  Filled 2020-01-20 (×6): qty 2
  Filled 2020-01-20: qty 1
  Filled 2020-01-20: qty 2
  Filled 2020-01-20: qty 1
  Filled 2020-01-20 (×9): qty 2

## 2020-01-20 MED ORDER — AMOXICILLIN-POT CLAVULANATE 875-125 MG PO TABS
1.0000 | ORAL_TABLET | Freq: Two times a day (BID) | ORAL | Status: DC
  Administered 2020-01-20 – 2020-01-25 (×9): 1 via ORAL
  Filled 2020-01-20 (×9): qty 1

## 2020-01-20 MED ORDER — PROPOFOL 10 MG/ML IV BOLUS
INTRAVENOUS | Status: DC | PRN
  Administered 2020-01-20: 100 mg via INTRAVENOUS

## 2020-01-20 MED ORDER — CIPROFLOXACIN IN D5W 400 MG/200ML IV SOLN
INTRAVENOUS | Status: DC | PRN
  Administered 2020-01-20: 400 mg via INTRAVENOUS

## 2020-01-20 MED ORDER — GLUCAGON HCL RDNA (DIAGNOSTIC) 1 MG IJ SOLR
INTRAMUSCULAR | Status: DC | PRN
  Administered 2020-01-20: .25 mg via INTRAVENOUS

## 2020-01-20 MED ORDER — ROCURONIUM BROMIDE 10 MG/ML (PF) SYRINGE
PREFILLED_SYRINGE | INTRAVENOUS | Status: DC | PRN
  Administered 2020-01-20: 50 mg via INTRAVENOUS
  Administered 2020-01-20: 20 mg via INTRAVENOUS

## 2020-01-20 MED ORDER — FENTANYL CITRATE (PF) 100 MCG/2ML IJ SOLN
25.0000 ug | INTRAMUSCULAR | Status: DC | PRN
  Administered 2020-01-20 (×2): 50 ug via INTRAVENOUS

## 2020-01-20 MED ORDER — SUGAMMADEX SODIUM 200 MG/2ML IV SOLN
INTRAVENOUS | Status: DC | PRN
  Administered 2020-01-20: 200 mg via INTRAVENOUS

## 2020-01-20 MED ORDER — LACTATED RINGERS IV SOLN: INTRAVENOUS | Status: DC

## 2020-01-20 MED ORDER — FENTANYL CITRATE (PF) 100 MCG/2ML IJ SOLN
INTRAMUSCULAR | Status: AC
  Filled 2020-01-20: qty 2

## 2020-01-20 MED ORDER — LIDOCAINE 2% (20 MG/ML) 5 ML SYRINGE
INTRAMUSCULAR | Status: DC | PRN
  Administered 2020-01-20: 100 mg via INTRAVENOUS

## 2020-01-20 MED ORDER — HYDROMORPHONE HCL 1 MG/ML IJ SOLN
0.5000 mg | INTRAMUSCULAR | Status: DC | PRN
  Administered 2020-01-20 – 2020-01-21 (×6): 0.5 mg via INTRAVENOUS
  Filled 2020-01-20 (×6): qty 1

## 2020-01-20 NOTE — Interval H&P Note (Signed)
History and Physical Interval Note:  01/20/2020 12:00 PM  Kenneth Coleman  has presented today for surgery, with the diagnosis of retained biliary stent, elevated LFTs.  The various methods of treatment have been discussed with the patient and family. After consideration of risks, benefits and other options for treatment, the patient has consented to  Procedure(s): ENDOSCOPIC RETROGRADE CHOLANGIOPANCREATOGRAPHY (ERCP) WITH PROPOFOL (N/A) as a surgical intervention.  The patient's history has been reviewed, patient examined, no change in status, stable for surgery.  I have reviewed the patient's chart and labs.  Questions were answered to the patient's satisfaction.    The risks of an ERCP were discussed at length, including but not limited to the risk of perforation, bleeding, abdominal pain, post-ERCP pancreatitis (while usually mild can be severe and even life threatening).  With the stent being in place for so long, there is increased risk of stent dysfunction while trying to remove this, so we will have to be cogniscent of this risk and potential need for repeat stenting.  Hopefully we will not have the stent break, but we will see how we can help this patient's biliary obstruction.  [employer]

## 2020-01-20 NOTE — Op Note (Signed)
[facility] Patient Name: Kenneth Coleman Procedure Date : 01/20/2020 MRN: [id number] Attending MD: Justice Britain , MD Date of Birth: 09/07/1955 CSN: [id number] Age: 64 Admit Type: Inpatient Procedure:                ERCP Indications:              Bile duct stone(s), Common bile duct stone(s),                            Abdominal pain of suspected biliary origin, Biliary                            dilation on Computed Tomogram Scan, Biliary stent                            occlusion on Computed Tomogram Scan, Abnormal liver                            function test, Biliary stent removal Providers:                Justice Britain, MD, Cleda Daub, RN, Ladona Ridgel, Technician, Dewitt Hoes, CRNA Referring MD:             Lajuan Lines. Pyrtle, MD, [facility] Medicines:                General Anesthesia Complications:            No immediate complications. Estimated Blood Loss:     Estimated blood loss was minimal. Procedure:                Pre-Anesthesia Assessment:                           - Prior to the procedure, a History and Physical                            was performed, and patient medications and                            allergies were reviewed. The patient's tolerance of                            previous anesthesia was also reviewed. The risks                            and benefits of the procedure and the sedation                            options and risks were discussed with the patient.                            All questions were answered, and informed consent  was obtained. Prior Anticoagulants: The patient has                            taken no previous anticoagulant or antiplatelet                            agents except for aspirin. ASA Grade Assessment:                            III - A patient with severe systemic disease. After                            reviewing the risks  and benefits, the patient was                            deemed in satisfactory condition to undergo the                            procedure.                           After obtaining informed consent, the scope was                            passed under direct vision. Throughout the                            procedure, the patient's blood pressure, pulse, and                            oxygen saturations were monitored continuously. The                            [id number]-H190 ([id number]) Olympus gastroscope was                            introduced through the mouth, and used to inject                            contrast into and used to locate the major papilla.                            The [id number]-[id number] ([id number]) Olympus duodenoscope was                            introduced through the mouth, and used to inject                            contrast into and used to inject contrast into the                            bile duct. The ERCP was technically difficult and  complex. Successful completion of the procedure was                            aided by performing the maneuvers documented                            (below) in this report. The patient tolerated the                            procedure. Scope In: Scope Out: Findings:      A biliary stent was visible on the scout film.      The esophagus was successfully intubated under direct vision without       detailed examination of the pharynx, larynx, and associated structures.       A standard esophagogastroduodenoscopy scope was used for the examination       of the upper gastrointestinal tract. The scope was passed under direct       vision through the upper GI tract. LA Grade A (one or more mucosal       breaks less than 5 mm, not extending between tops of 2 mucosal folds)       esophagitis with no bleeding was found in the proximal esophagus. LA       Grade B (one or more mucosal breaks greater than 5 mm,  not extending       between the tops of two mucosal folds) esophagitis with no bleeding was       found at the gastroesophageal junction. No other findings in the       esophagus. No overt stenosis noted. Red blood and hematin was found in       the gastric body and in the gastric antrum - lavaged with clearance.       Five small angioectasias with stigmata of recent bleeding were found in       the gastric body and at the incisura in regions of where the blood had       been. Three small angioectasias with evidence of stigmata of recent       bleeding (hematin/red blood) were found in the duodenal bulb. A biliary       sphincterotomy had been performed. The sphincterotomy appeared open. One       encrusted plastic biliary stent originating in the biliary tree was       emerging from the major papilla - partially migrated downwards. The       stent was visibly occluded. Carefully pulling on the stent initially, I       thought it was reasonable to proceed with attempt at removal prior to       biliary duct cannulation. The stent was removed from the biliary tree       using a Raptor grasping device and was not broken.      A short 0.035 inch Soft Jagwire was passed into the biliary tree. The       short-nosed traction sphincterotome was passed over the guidewire and       the bile duct was then deeply cannulated. Contrast was injected. I       personally interpreted the bile duct images. Ductal flow of contrast was       adequate. Image quality was adequate. Contrast extended to the hepatic  ducts. Opacification of the entire biliary tree except for the cystic       duct and gallbladder was successful. The main bile duct was moderately       dilated. The largest diameter was 18 mm. The main bile duct contained       filling defects thought to be stones and sludge. To discover objects,       the biliary tree was swept with a retrieval balloon slowly moving from       the distal CBD to  the bifurcation. Significant amounts of sludge was       swept from the duct. Many stone and stone fragments were removed. One       large 15 mm stone remained. I proceeded with a sphincteroplasty and       dilation of the common bile duct with an 11-15-08 mm balloon (to a maximum       balloon size of 10 mm) dilator was successful for a total of 4 minutes.       I was prepared to go larger but decided to attempt extraction. After       having the stone impact partially distally, I pushed it back upwards and       then with further pull through the stone was removed. It caused some       irritation to the sphincterotomy/sphincteroplasty site with mild oozing.       I replaced the dilation balloon and left it in place for 2 minutes for       tamponade effect. To discover objects, the biliary tree was swept with a       retrieval balloon starting at the bifurcation. No stones remained. An       occlusion cholangiogram was performed that showed no further significant       biliary pathology.      A pancreatogram was not performed.      The duodenoscope was withdrawn from the patient.      The endoscope was replaced. Coagulation for hemostasis in the region of       the bleeding and non-bleeding gastric AVMs was performed (gastric       setting). Coagulation for hemostasis in the duodenal bulb of the AVMs       using argon plasma through the esophagogastroduodenoscope was successful       (right colon settings).      The endoscope was removed from the patient. Impression:               - LA Grade A esophagitis with no bleeding                            proximally. LA Grade B esophagitis with no bleeding                            distally.                           - Red blood/hematin in the gastric antrum and in                            the gastric body - lavaged finding evidence of  gastric AVMs. At end of ERCP, APC performed for                             hemostasis.                           - Three duodenal bulb AVMs with stigmata of recent                            bleeding noted. At end of ERCP, APC performed for                            hemostasis                           - Prior biliary sphincterotomy appeared open.                           - One visibly occluded stent from the biliary tree                            was seen in the major papilla. This was able to be                            removed.                           - Hemostasis in the entire examined stomach with                            argon plasma coagulation (APC) was performed.                           - Hemostasis in the duodenal bulb with argon plasma                            coagulation (APC) was performed.                           - Filling defects consistent with stone and sludge                            were seen on the cholangiogram.                           - The entire main bile duct was moderately dilated.                           - Choledocholithiasis and significant sludge was                            found. Complete removal was accomplished by                            sweeping, balloon sphincteroplasty, further  balloon                            trawl. Recommendation:           - The patient will be observed post-procedure,                            until all discharge criteria are met.                           - Return patient to hospital ward for ongoing care.                           - Check liver enzymes (AST, ALT, alkaline                            phosphatase, bilirubin) in the morning.                           - Trend Hgb/Hct.                           - If patient has Iron deficiency, would recommend                            IV Iron while in house.                           - PO Iron can be initiated in 1 week post-procedure                            to decrease concern of bleeding from recent                             interventions.                           - Watch for pancreatitis, bleeding, perforation,                            and cholangitis.                           - Observe patient's clinical course.                           - Patient should be evaluated by surgery to have                            cholecystectomy during this hospital admission to                            decrease risk of recurrence of                            cholangitis/choledocholithiasis.                           -  No chemical VTE PPx for 24-36 hours to decrease                            risk of bleeding post-intervention.                           - The findings and recommendations were discussed                            with the patient.                           - The findings and recommendations were discussed                            with the patient's family.                           - The findings and recommendations were discussed                            with the referring physician. Procedure Code(s):        --- Professional ---                           [phone], Endoscopic retrograde                            cholangiopancreatography (ERCP); with removal of                            foreign body(s) or stent(s) from biliary/pancreatic                            duct(s)                           43264, Endoscopic retrograde                            cholangiopancreatography (ERCP); with removal of                            calculi/debris from biliary/pancreatic duct(s)                           43255, Esophagogastroduodenoscopy, flexible,                            transoral; with control of bleeding, any method Diagnosis Code(s):        --- Professional ---                           K20.90, Esophagitis, unspecified without bleeding                           K92.2, Gastrointestinal hemorrhage, unspecified  K31.811, Angiodysplasia of stomach and duodenum                             with bleeding                           T85.590A, Other mechanical complication of bile                            duct prosthesis, initial encounter                           K83.8, Other specified diseases of biliary tract                           K80.50, Calculus of bile duct without cholangitis                            or cholecystitis without obstruction                           Z46.59, Encounter for fitting and adjustment of                            other gastrointestinal appliance and device                           R10.9, Unspecified abdominal pain                           R94.5, Abnormal results of liver function studies                           R93.2, Abnormal findings on diagnostic imaging of                            liver and biliary tract CPT copyright 2019 [employer]. All rights reserved. The codes documented in this report are preliminary and upon coder review may  be revised to meet current compliance requirements. Justice Britain, MD 01/20/2020 5:48:27 PM Number of Addenda: 0

## 2020-01-20 NOTE — Progress Notes (Addendum)
PROGRESS NOTE    Kenneth Coleman  XBJ:[id number] DOB: 1955-12-10 DOA: 01/15/2020 PCP: System, Provider Not In   Brief Narrative: Kenneth Coleman is a 64 y.o. male with a history of COPD, chronic respiratory failure on 2.5 L, hypertension, coronary disease, chronic diastolic CHF. Patient presented secondary to dysphagia, abdominal pain and weight loss and found to have evidence of choledocholithiasis. On imaging, there was also evidence of esophogeal thickening for which he underwent EGD with negative biopsies. He was transferred to [facility] for ERCP.   Assessment & Plan:   Principal Problem:   Choledocholithiasis Active Problems:   CHF (congestive heart failure) (HCC)   COPD (chronic obstructive pulmonary disease) (HCC)   Coronary artery disease   Hypertension   Hypokalemia   Acute on chronic respiratory failure with hypoxia (HCC)   Lobar pneumonia (HCC)   Bile obstruction   Esophageal dysphagia   Choledocholithiasis Diagnosed on ultrasound. Patient with a CBD biliary stent, however, imaging concerning for recurrent biliary obstruction. Patient transferred to [facility] for ERCP. Alkaline phosphatase trending up with AST/ALT trending down. Bilirubin trended down slightly. -GI recommendations: ERCP today  Dysphagia In relation to esophageal thickening. S/p EGD as mentioned below.  Esophageal thickening Patient underwent Upper GI endoscopy with biopsies obtained which were significant for reactive squamous mucosa. No strictures/mass noted on EGD.  Malnutrition Albumin of 1.9. -Dietitian consult  Acute on chronic respiratory failure with hypoxia Patient is on 2-1/2 L of oxygen via nasal cannula as an outpatient.  Patient required up to 4 L of oxygen via nasal cannula while inpatient with saturations down to 80%.  Appears to be secondary to possible aspiration pneumonitis versus pneumonia.  Patient is now weaned back to home 2 L of oxygen.  Left lower lobe  pneumonia Likely related to aspiration.  Patient started on cefepime and Flagyl. MRSA pcr is positive. No leukocytosis or fever. 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 Non-bleeding.Treated.  Chronic diastolic heart failure -Continue spironolactone, coreg  DVT prophylaxis: SCDs Code Status:   Code Status: Full Code Family Communication: None at bedside Disposition Plan: Discharge likely in a few days pending GI recommendations   Consultants:   Gastroenterology  Procedures:   UPPER GI ENDOSCOPY (10/9)  Antimicrobials:  Cefepime  Flagyl   Augementin   Subjective: No concerns overnight or this morning.  Objective: Vitals:   01/19/20 1305 01/19/20 2059 01/19/20 2122 01/20/20 0411  BP: 132/76  128/69 (!) 151/72  Pulse: (!) 57  (!) 56 (!) 57  Resp: 20  16 20   Temp: 97.7 F (36.5 C)  97.7 F (36.5 C) 97.6 F (36.4 C)  TempSrc: Oral  Oral Oral  SpO2: 100% 96% 98% 98%  Weight:      Height:        Intake/Output Summary (Last 24 hours) at 01/20/2020 1046 Last data filed at 01/20/2020 0803 Gross per 24 hour  Intake 240 ml  Output 3025 ml  Net -2785 ml   Filed Weights   01/17/20 1100  Weight: 70.3 kg    Examination:  General exam: Appears calm and comfortable  Respiratory system: Clear to auscultation. Respiratory effort normal. Cardiovascular system: S1 & S2 heard, RRR. No murmurs, rubs, gallops or clicks. Gastrointestinal system: Abdomen is nondistended, soft and tender in epigastrium. No organomegaly or masses felt. Normal bowel sounds heard. Central nervous system: Alert and oriented. No focal neurological deficits. Musculoskeletal: No edema. No calf tenderness Skin: No  cyanosis. No rashes Psychiatry: Judgement and insight appear normal. Mood & affect appropriate.     Data Reviewed: I have personally reviewed following labs and imaging studies  CBC Lab Results  Component Value  Date   WBC 7.9 01/20/2020   RBC 3.54 (L) 01/20/2020   HGB 8.8 (L) 01/20/2020   HCT 28.7 (L) 01/20/2020   MCV 81.1 01/20/2020   MCH 24.9 (L) 01/20/2020   PLT 552 (H) 01/20/2020   MCHC 30.7 01/20/2020   RDW 15.3 01/20/2020   LYMPHSABS 1.5 01/15/2020   MONOABS 0.9 01/15/2020   EOSABS 0.0 01/15/2020   BASOSABS 0.0 08/65/7846     Last metabolic panel Lab Results  Component Value Date   NA 133 (L) 01/20/2020   K 4.8 01/20/2020   CL 98 01/20/2020   CO2 29 01/20/2020   BUN <5 (L) 01/20/2020   CREATININE 0.37 (L) 01/20/2020   GLUCOSE 85 01/20/2020   GFRNONAA >60 01/20/2020   CALCIUM 8.5 (L) 01/20/2020   PROT 6.7 01/20/2020   ALBUMIN 1.9 (L) 01/20/2020   BILITOT 1.8 (H) 01/20/2020   ALKPHOS 679 (H) 01/20/2020   AST 90 (H) 01/20/2020   ALT 70 (H) 01/20/2020   ANIONGAP 6 01/20/2020    CBG (last 3)  No results for input(s): GLUCAP in the last 72 hours.   GFR: Estimated Creatinine Clearance: 91.5 mL/min (A) (by C-G formula based on SCr of 0.37 mg/dL (L)).  Coagulation Profile: Recent Labs  Lab 01/18/20 1154  INR 1.0    Recent Results (from the past 240 hour(s))  Resp Panel by RT PCR (RSV, Flu A&B, Covid) - Nasopharyngeal Swab     Status: None   Collection Time: 01/15/20  1:48 PM   Specimen: Nasopharyngeal Swab  Result Value Ref Range Status   SARS Coronavirus 2 by RT PCR NEGATIVE NEGATIVE Final    Comment: (NOTE) SARS-CoV-2 target nucleic acids are NOT DETECTED.  The SARS-CoV-2 RNA is generally detectable in upper respiratoy specimens during the acute phase of infection. The lowest concentration of SARS-CoV-2 viral copies this assay can detect is 131 copies/mL. A negative result does not preclude SARS-Cov-2 infection and should not be used as the sole basis for treatment or other patient management decisions. A negative result may occur with  improper specimen collection/handling, submission of specimen other than nasopharyngeal swab, presence of viral mutation(s)  within the areas targeted by this assay, and inadequate number of viral copies (<131 copies/mL). A negative result must be combined with clinical observations, patient history, and epidemiological information. The expected result is Negative.  Fact Sheet for Patients:  PinkCheek.be  Fact Sheet for Healthcare Providers:  GravelBags.it  This test is no t yet approved or cleared by the [place] FDA and  has been authorized for detection and/or diagnosis of SARS-CoV-2 by FDA under an Emergency Use Authorization (EUA). This EUA will remain  in effect (meaning this test can be used) for the duration of the COVID-19 declaration under Section 564(b)(1) of the Act, 21 U.S.C. section 360bbb-3(b)(1), unless the authorization is terminated or revoked sooner.     Influenza A by PCR NEGATIVE NEGATIVE Final   Influenza B by PCR NEGATIVE NEGATIVE Final    Comment: (NOTE) The Xpert Xpress SARS-CoV-2/FLU/RSV assay is intended as an aid in  the diagnosis of influenza from Nasopharyngeal swab specimens and  should not be used as a sole basis for treatment. Nasal washings and  aspirates are unacceptable for Xpert Xpress SARS-CoV-2/FLU/RSV  testing.  Fact Sheet for  Patients: PinkCheek.be  Fact Sheet for Healthcare Providers: GravelBags.it  This test is not yet approved or cleared by the [place] FDA and  has been authorized for detection and/or diagnosis of SARS-CoV-2 by  FDA under an Emergency Use Authorization (EUA). This EUA will remain  in effect (meaning this test can be used) for the duration of the  Covid-19 declaration under Section 564(b)(1) of the Act, 21  U.S.C. section 360bbb-3(b)(1), unless the authorization is  terminated or revoked.    Respiratory Syncytial Virus by PCR NEGATIVE NEGATIVE Final    Comment: (NOTE) Fact Sheet for  Patients: PinkCheek.be  Fact Sheet for Healthcare Providers: GravelBags.it  This test is not yet approved or cleared by the [place] FDA and  has been authorized for detection and/or diagnosis of SARS-CoV-2 by  FDA under an Emergency Use Authorization (EUA). This EUA will remain  in effect (meaning this test can be used) for the duration of the  COVID-19 declaration under Section 564(b)(1) of the Act, 21 U.S.C.  section 360bbb-3(b)(1), unless the authorization is terminated or  revoked. Performed at [facility], [address] [place], [place] [address]   MRSA PCR Screening     Status: Abnormal   Collection Time: 01/17/20 12:58 PM   Specimen: Nasopharyngeal  Result Value Ref Range Status   MRSA by PCR POSITIVE (A) NEGATIVE Final    Comment:        The GeneXpert MRSA Assay (FDA approved for NASAL specimens only), is one component of a comprehensive MRSA colonization surveillance program. It is not intended to diagnose MRSA infection nor to guide or monitor treatment for MRSA infections. RESULT CALLED TO, READ BACK BY AND VERIFIED WITH: L BULLINS AT 1723 ON 01/17/2020 BY MOSLEY,J Performed at [facility], [address] [place], [place] [address]         Radiology Studies: No results found.      Scheduled Meds: . budesonide (PULMICORT) nebulizer solution  0.5 mg Nebulization BID  . carvedilol  6.25 mg Oral BID  . Chlorhexidine Gluconate Cloth  6 each Topical Q0600  . digoxin  125 mcg Oral Daily  . docusate sodium  100 mg Oral Daily  . feeding supplement (ENSURE ENLIVE)  237 mL Oral TID BM  . indomethacin  100 mg Rectal Once  . mupirocin ointment  1 application Nasal BID  . potassium chloride  40 mEq Oral BID  . spironolactone  25 mg Oral Daily   Continuous Infusions: . sodium chloride 50 mL/hr at 01/19/20 2039  . ceFEPime (MAXIPIME) IV 2 g (01/20/20 0536)  . metronidazole 500 mg (01/20/20 0630)      LOS: 5 days     Cordelia Poche, MD [facility] 01/20/2020, 10:46 AM  If 7PM-7AM, please contact night-coverage [email or web address]

## 2020-01-20 NOTE — Anesthesia Procedure Notes (Signed)
Procedure Name: Intubation Date/Time: 01/20/2020 3:39 PM Performed by: Trinna Post., CRNA Pre-anesthesia Checklist: Patient identified, Emergency Drugs available, Suction available, Patient being monitored and Timeout performed Patient Re-evaluated:Patient Re-evaluated prior to induction Oxygen Delivery Method: Circle system utilized Preoxygenation: Pre-oxygenation with 100% oxygen Induction Type: IV induction Ventilation: Mask ventilation without difficulty Laryngoscope Size: Mac and 4 Grade View: Grade I Tube type: Oral Tube size: 7.5 mm Number of attempts: 1 Airway Equipment and Method: Stylet Placement Confirmation: ETT inserted through vocal cords under direct vision,  positive ETCO2 and breath sounds checked- equal and bilateral Secured at: 22 cm Tube secured with: Tape Dental Injury: Teeth and Oropharynx as per pre-operative assessment

## 2020-01-20 NOTE — Anesthesia Preprocedure Evaluation (Addendum)
Anesthesia Evaluation  Patient identified by MRN, date of birth, ID band Patient awake    Reviewed: Allergy & Precautions, H&P , NPO status , Patient's Chart, lab work & pertinent test results  History of Anesthesia Complications Negative for: history of anesthetic complications  Airway Mallampati: II  TM Distance: >3 FB Neck ROM: Full    Dental  (+) Poor Dentition, Loose, Dental Advisory Given   Pulmonary COPD,  oxygen dependent, Current Smoker,    Pulmonary exam normal        Cardiovascular Exercise Tolerance: Good hypertension, + CAD and +CHF  Normal cardiovascular exam     Neuro/Psych negative neurological ROS  negative psych ROS   GI/Hepatic negative GI ROS, Neg liver ROS,   Endo/Other  negative endocrine ROS  Renal/GU negative Renal ROS  negative genitourinary   Musculoskeletal   Abdominal   Peds  Hematology negative hematology ROS (+)   Anesthesia Other Findings   Reproductive/Obstetrics negative OB ROS                            Anesthesia Physical  Anesthesia Plan  ASA: III  Anesthesia Plan: General   Post-op Pain Management:    Induction:   PONV Risk Score and Plan: 2 and Ondansetron and Dexamethasone  Airway Management Planned: Oral ETT  Additional Equipment:   Intra-op Plan:   Post-operative Plan: Extubation in OR  Informed Consent: I have reviewed the patients History and Physical, chart, labs and discussed the procedure including the risks, benefits and alternatives for the proposed anesthesia with the patient or authorized representative who has indicated his/her understanding and acceptance.     Dental advisory given  Plan Discussed with: CRNA and Anesthesiologist  Anesthesia Plan Comments:        Anesthesia Quick Evaluation

## 2020-01-20 NOTE — Transfer of Care (Signed)
Immediate Anesthesia Transfer of Care Note  Patient: Kenneth Coleman  Procedure(s) Performed: ENDOSCOPIC RETROGRADE CHOLANGIOPANCREATOGRAPHY (ERCP) WITH PROPOFOL (N/A ) BILIARY DILITATION (N/A ) STENT REMOVAL REMOVAL OF STONES HOT HEMOSTASIS (ARGON PLASMA COAGULATION/BICAP) (N/A ) HEMOSTASIS CLIP PLACEMENT  Patient Location: PACU  Anesthesia Type:General  Level of Consciousness: awake  Airway & Oxygen Therapy: Patient Spontanous Breathing and Patient connected to face mask oxygen  Post-op Assessment: Report given to RN and Post -op Vital signs reviewed and stable  Post vital signs: Reviewed and stable  Last Vitals:  Vitals Value Taken Time  BP    Temp    Pulse    Resp    SpO2      Last Pain:  Vitals:   01/20/20 1104  TempSrc: Oral  PainSc: 0-No pain      Patients Stated Pain Goal: 3 (91/47/82 9562)  Complications: No complications documented.

## 2020-01-20 NOTE — Progress Notes (Signed)
Physical Therapy Treatment Patient Details Name: Kenneth Coleman MRN: [id number] DOB: 1955-09-14 Today's Date: 01/20/2020    History of Present Illness 64 year old male with a history of COPD, chronic respiratory failure on 2.5 L, hypertension, coronary disease, CHF presenting with dysphagia, abdominal pain, weight loss, and generalized weakness.     PT Comments    Patient progressing well towards his physical therapy goals. Session focused on B LE strengthening and mobility. Patient ambulated 500' with supervision and use of IV pole. Patient on 2L O2 Kenneth Coleman, spO2 at rest 96%, following activity spO2 was 96%. Patient continues to be limited by decreased activity tolerance, impaired dynamic balance, and generalized weakness.  Do not anticipate need for follow up PT services at discharge. PT will continue to follow.    Follow Up Recommendations  No PT follow up     Equipment Recommendations  None recommended by PT    Recommendations for Other Services Speech consult (for swallow)     Precautions / Restrictions Precautions Precautions: None Restrictions Weight Bearing Restrictions: No    Mobility  Bed Mobility Overal bed mobility: Modified Independent                Transfers Overall transfer level: Modified independent Equipment used: None             General transfer comment: patient able to stand safely from bed and aware of tubes/lines  Ambulation/Gait Ambulation/Gait assistance: Supervision Gait Distance (Feet): 500 Feet Assistive device: IV Pole Gait Pattern/deviations: Drifts right/left;Wide base of support;Step-through pattern     General Gait Details: 2L O2 Kenneth Coleman; spO2 prior to activity 96%, following activity 96%   Stairs             Wheelchair Mobility    Modified Rankin (Stroke Patients Only)       Balance Overall balance assessment: Needs assistance Sitting-balance support: Feet supported;No upper extremity supported Sitting  balance-Leahy Scale: Good     Standing balance support: No upper extremity supported;During functional activity Standing balance-Leahy Scale: Fair Standing balance comment: Pt required min guard with standing exercises                            Cognition Arousal/Alertness: Awake/alert Behavior During Therapy: WFL for tasks assessed/performed Overall Cognitive Status: Within Functional Limits for tasks assessed                                        Exercises General Exercises - Lower Extremity Hip Flexion/Marching: AROM;Strengthening;Both;20 reps;Standing Toe Raises: AROM;Strengthening;Both;10 reps;Standing Heel Raises: AROM;Strengthening;Both;10 reps;Standing Mini-Sqauts: AROM;Strengthening;Both;10 reps;Standing    General Comments        Pertinent Vitals/Pain Pain Assessment: No/denies pain    Home Living                      Prior Function            PT Goals (current goals can now be found in the care plan section) Acute Rehab PT Goals Patient Stated Goal: to go home PT Goal Formulation: With patient Time For Goal Achievement: 02/01/20 Potential to Achieve Goals: Good Progress towards PT goals: Progressing toward goals    Frequency    Min 3X/week      PT Plan Current plan remains appropriate    Co-evaluation  AM-PAC PT "6 Clicks" Mobility   Outcome Measure  Help needed turning from your back to your side while in a flat bed without using bedrails?: None Help needed moving from lying on your back to sitting on the side of a flat bed without using bedrails?: None Help needed moving to and from a bed to a chair (including a wheelchair)?: None Help needed standing up from a chair using your arms (e.g., wheelchair or bedside chair)?: None Help needed to walk in hospital room?: A Little Help needed climbing 3-5 steps with a railing? : A Little 6 Click Score: 22    End of Session Equipment Utilized  During Treatment: Oxygen Activity Tolerance: Patient tolerated treatment well Patient left: in bed;with call bell/phone within reach Nurse Communication: Mobility status PT Visit Diagnosis: Muscle weakness (generalized) (M62.81)     Time: 6578-4696 PT Time Calculation (min) (ACUTE ONLY): 21 min  Charges:  $Therapeutic Activity: 8-22 mins                     Kenneth Coleman, PT, DPT Acute Rehabilitation Services Pager [phone] Office [phone]   Kenneth Coleman 01/20/2020, 9:44 AM

## 2020-01-21 ENCOUNTER — Encounter (HOSPITAL_COMMUNITY): Payer: Self-pay | Admitting: Gastroenterology

## 2020-01-21 ENCOUNTER — Inpatient Hospital Stay (HOSPITAL_COMMUNITY): Payer: Medicare Other | Admitting: Certified Registered"

## 2020-01-21 ENCOUNTER — Encounter (HOSPITAL_COMMUNITY)
Admission: EM | Disposition: A | Discharge status: Home Health | Payer: Self-pay | Source: Home / Self Care | Attending: Family Medicine

## 2020-01-21 ENCOUNTER — Telehealth: Payer: Self-pay | Admitting: Gastroenterology

## 2020-01-21 DIAGNOSIS — K209 Esophagitis, unspecified without bleeding: Secondary | ICD-10-CM | POA: Diagnosis not present

## 2020-01-21 DIAGNOSIS — K8050 Calculus of bile duct without cholangitis or cholecystitis without obstruction: Secondary | ICD-10-CM | POA: Diagnosis not present

## 2020-01-21 DIAGNOSIS — K831 Obstruction of bile duct: Secondary | ICD-10-CM | POA: Diagnosis not present

## 2020-01-21 DIAGNOSIS — J181 Lobar pneumonia, unspecified organism: Secondary | ICD-10-CM | POA: Diagnosis not present

## 2020-01-21 DIAGNOSIS — J9621 Acute and chronic respiratory failure with hypoxia: Secondary | ICD-10-CM | POA: Diagnosis not present

## 2020-01-21 HISTORY — PX: CHOLECYSTECTOMY: SHX55

## 2020-01-21 LAB — COMPREHENSIVE METABOLIC PANEL
ALT: 51 U/L — ABNORMAL HIGH (ref 0–44)
AST: 45 U/L — ABNORMAL HIGH (ref 15–41)
Albumin: 1.9 g/dL — ABNORMAL LOW (ref 3.5–5.0)
Alkaline Phosphatase: 561 U/L — ABNORMAL HIGH (ref 38–126)
Anion gap: 9 (ref 5–15)
BUN: 9 mg/dL (ref 8–23)
CO2: 26 mmol/L (ref 22–32)
Calcium: 8.4 mg/dL — ABNORMAL LOW (ref 8.9–10.3)
Chloride: 98 mmol/L (ref 98–111)
Creatinine, Ser: 0.41 mg/dL — ABNORMAL LOW (ref 0.61–1.24)
GFR, Estimated: >60 mL/min (ref >60)
Glucose, Bld: 102 mg/dL — ABNORMAL HIGH (ref 70–99)
Potassium: 4.8 mmol/L (ref 3.5–5.1)
Sodium: 133 mmol/L — ABNORMAL LOW (ref 135–145)
Total Bilirubin: 1.2 mg/dL (ref 0.3–1.2)
Total Protein: 6.4 g/dL — ABNORMAL LOW (ref 6.5–8.1)

## 2020-01-21 LAB — DIGOXIN LEVEL: Digoxin Level: 0.2 ng/mL — ABNORMAL LOW (ref 1.0–2.0)

## 2020-01-21 LAB — CBC
HCT: 28.6 % — ABNORMAL LOW (ref 39.0–52.0)
Hemoglobin: 8.6 g/dL — ABNORMAL LOW (ref 13.0–17.0)
MCH: 24.5 pg — ABNORMAL LOW (ref 26.0–34.0)
MCHC: 30.1 g/dL (ref 30.0–36.0)
MCV: 81.5 fL (ref 80.0–100.0)
Platelets: 532 10*3/uL — ABNORMAL HIGH (ref 150–400)
RBC: 3.51 MIL/uL — ABNORMAL LOW (ref 4.22–5.81)
RDW: 15.3 % (ref 11.5–15.5)
WBC: 6.6 10*3/uL (ref 4.0–10.5)
nRBC: 0 % (ref 0.0–0.2)

## 2020-01-21 SURGERY — LAPAROSCOPIC CHOLECYSTECTOMY WITH INTRAOPERATIVE CHOLANGIOGRAM
Anesthesia: General | Site: Abdomen | Laterality: Bilateral

## 2020-01-21 MED ORDER — ACETAMINOPHEN 10 MG/ML IV SOLN
INTRAVENOUS | Status: DC | PRN
  Administered 2020-01-21: 1000 mg via INTRAVENOUS

## 2020-01-21 MED ORDER — LIDOCAINE 2% (20 MG/ML) 5 ML SYRINGE
INTRAMUSCULAR | Status: DC | PRN
  Administered 2020-01-21: 40 mg via INTRAVENOUS

## 2020-01-21 MED ORDER — PROSOURCE PLUS PO LIQD
30.0000 mL | Freq: Two times a day (BID) | ORAL | Status: DC
  Administered 2020-01-22 (×2): 30 mL via ORAL
  Filled 2020-01-21: qty 30

## 2020-01-21 MED ORDER — 0.9 % SODIUM CHLORIDE (POUR BTL) OPTIME
TOPICAL | Status: DC | PRN
  Administered 2020-01-21: 1000 mL

## 2020-01-21 MED ORDER — KETOROLAC TROMETHAMINE 30 MG/ML IJ SOLN
15.0000 mg | Freq: Three times a day (TID) | INTRAMUSCULAR | Status: AC | PRN
  Administered 2020-01-22 – 2020-01-23 (×2): 15 mg via INTRAVENOUS
  Filled 2020-01-21 (×2): qty 1

## 2020-01-21 MED ORDER — SODIUM CHLORIDE 0.9 % IV SOLN
2.0000 g | INTRAVENOUS | Status: AC
  Administered 2020-01-21: 2 g via INTRAVENOUS
  Filled 2020-01-21: qty 20

## 2020-01-21 MED ORDER — FENTANYL CITRATE (PF) 100 MCG/2ML IJ SOLN
25.0000 ug | INTRAMUSCULAR | Status: DC | PRN
  Administered 2020-01-21: 25 ug via INTRAVENOUS

## 2020-01-21 MED ORDER — ADULT MULTIVITAMIN W/MINERALS CH
1.0000 | ORAL_TABLET | Freq: Every day | ORAL | Status: DC
  Administered 2020-01-22 – 2020-01-25 (×4): 1 via ORAL
  Filled 2020-01-21 (×4): qty 1

## 2020-01-21 MED ORDER — ONDANSETRON HCL 4 MG/2ML IJ SOLN
INTRAMUSCULAR | Status: AC
  Filled 2020-01-21: qty 2

## 2020-01-21 MED ORDER — GLYCOPYRROLATE PF 0.2 MG/ML IJ SOSY
PREFILLED_SYRINGE | INTRAMUSCULAR | Status: AC
  Filled 2020-01-21: qty 1

## 2020-01-21 MED ORDER — SODIUM CHLORIDE 0.9 % IV SOLN: INTRAVENOUS | Status: DC

## 2020-01-21 MED ORDER — FENTANYL CITRATE (PF) 100 MCG/2ML IJ SOLN
INTRAMUSCULAR | Status: AC
  Filled 2020-01-21: qty 2

## 2020-01-21 MED ORDER — LIDOCAINE 2% (20 MG/ML) 5 ML SYRINGE
INTRAMUSCULAR | Status: AC
  Filled 2020-01-21: qty 5

## 2020-01-21 MED ORDER — BUPIVACAINE-EPINEPHRINE 0.25% -1:200000 IJ SOLN
INTRAMUSCULAR | Status: DC | PRN
  Administered 2020-01-21: 16 mL

## 2020-01-21 MED ORDER — ROCURONIUM BROMIDE 10 MG/ML (PF) SYRINGE
PREFILLED_SYRINGE | INTRAVENOUS | Status: DC | PRN
  Administered 2020-01-21: 40 mg via INTRAVENOUS

## 2020-01-21 MED ORDER — ESMOLOL HCL 100 MG/10ML IV SOLN
INTRAVENOUS | Status: DC | PRN
  Administered 2020-01-21: 20 mg via INTRAVENOUS

## 2020-01-21 MED ORDER — PANTOPRAZOLE SODIUM 40 MG PO TBEC
40.0000 mg | DELAYED_RELEASE_TABLET | Freq: Two times a day (BID) | ORAL | Status: DC
  Administered 2020-01-21 – 2020-01-25 (×8): 40 mg via ORAL
  Filled 2020-01-21 (×8): qty 1

## 2020-01-21 MED ORDER — DEXAMETHASONE SODIUM PHOSPHATE 10 MG/ML IJ SOLN
INTRAMUSCULAR | Status: DC | PRN
  Administered 2020-01-21: 5 mg via INTRAVENOUS

## 2020-01-21 MED ORDER — ONDANSETRON HCL 4 MG/2ML IJ SOLN
INTRAMUSCULAR | Status: DC | PRN
  Administered 2020-01-21: 4 mg via INTRAVENOUS

## 2020-01-21 MED ORDER — BOOST / RESOURCE BREEZE PO LIQD CUSTOM
1.0000 | Freq: Three times a day (TID) | ORAL | Status: DC
  Administered 2020-01-21 – 2020-01-22 (×3): 1 via ORAL

## 2020-01-21 MED ORDER — ATROPINE SULFATE 0.4 MG/ML IJ SOLN
INTRAMUSCULAR | Status: DC | PRN
  Administered 2020-01-21: .5 mg via INTRAVENOUS

## 2020-01-21 MED ORDER — SUGAMMADEX SODIUM 200 MG/2ML IV SOLN
INTRAVENOUS | Status: DC | PRN
  Administered 2020-01-21: 150 mg via INTRAVENOUS

## 2020-01-21 MED ORDER — KETOROLAC TROMETHAMINE 30 MG/ML IJ SOLN
INTRAMUSCULAR | Status: DC | PRN
  Administered 2020-01-21: 15 mg via INTRAVENOUS

## 2020-01-21 MED ORDER — FENTANYL CITRATE (PF) 250 MCG/5ML IJ SOLN
INTRAMUSCULAR | Status: AC
  Filled 2020-01-21: qty 5

## 2020-01-21 MED ORDER — DEXAMETHASONE SODIUM PHOSPHATE 10 MG/ML IJ SOLN
INTRAMUSCULAR | Status: AC
  Filled 2020-01-21: qty 1

## 2020-01-21 MED ORDER — FENTANYL CITRATE (PF) 100 MCG/2ML IJ SOLN
INTRAMUSCULAR | Status: DC | PRN
  Administered 2020-01-21: 50 ug via INTRAVENOUS
  Administered 2020-01-21: 75 ug via INTRAVENOUS

## 2020-01-21 MED ORDER — GLYCOPYRROLATE 0.2 MG/ML IJ SOLN
INTRAMUSCULAR | Status: DC | PRN
  Administered 2020-01-21: .2 mg via INTRAVENOUS

## 2020-01-21 MED ORDER — SODIUM CHLORIDE 0.9 % IR SOLN
Status: DC | PRN
  Administered 2020-01-21: 1000 mL

## 2020-01-21 MED ORDER — ACETAMINOPHEN 500 MG PO TABS
1000.0000 mg | ORAL_TABLET | Freq: Three times a day (TID) | ORAL | Status: DC
  Administered 2020-01-22 – 2020-01-25 (×10): 1000 mg via ORAL
  Filled 2020-01-21 (×11): qty 2

## 2020-01-21 MED ORDER — ONDANSETRON HCL 4 MG/2ML IJ SOLN: 4.0000 mg | Freq: Once | INTRAMUSCULAR | Status: DC | PRN

## 2020-01-21 MED ORDER — LACTATED RINGERS IV SOLN: INTRAVENOUS | Status: DC | PRN

## 2020-01-21 MED ORDER — MIDAZOLAM HCL 2 MG/2ML IJ SOLN
INTRAMUSCULAR | Status: AC
  Filled 2020-01-21: qty 2

## 2020-01-21 MED ORDER — PROPOFOL 10 MG/ML IV BOLUS
INTRAVENOUS | Status: AC
  Filled 2020-01-21: qty 20

## 2020-01-21 MED ORDER — PHENYLEPHRINE HCL-NACL 10-0.9 MG/250ML-% IV SOLN
INTRAVENOUS | Status: DC | PRN
  Administered 2020-01-21: 30 ug/min via INTRAVENOUS

## 2020-01-21 MED ORDER — LACTATED RINGERS IV SOLN: INTRAVENOUS | Status: DC

## 2020-01-21 MED ORDER — CHLORHEXIDINE GLUCONATE 0.12 % MT SOLN
15.0000 mL | OROMUCOSAL | Status: AC
  Administered 2020-01-21: 15 mL via OROMUCOSAL
  Filled 2020-01-21: qty 15

## 2020-01-21 MED ORDER — BUPIVACAINE-EPINEPHRINE (PF) 0.25% -1:200000 IJ SOLN
INTRAMUSCULAR | Status: AC
  Filled 2020-01-21: qty 20

## 2020-01-21 MED ORDER — PROPOFOL 10 MG/ML IV BOLUS
INTRAVENOUS | Status: DC | PRN
  Administered 2020-01-21: 120 mg via INTRAVENOUS

## 2020-01-21 MED ORDER — SUCCINYLCHOLINE CHLORIDE 20 MG/ML IJ SOLN
INTRAMUSCULAR | Status: DC | PRN
  Administered 2020-01-21: 100 mg via INTRAVENOUS

## 2020-01-21 SURGICAL SUPPLY — 47 items
APPLIER CLIP 5 13 M/L LIGAMAX5 (MISCELLANEOUS) ×3
BLADE CLIPPER SURG (BLADE) IMPLANT
CANISTER SUCT 3000ML PPV (MISCELLANEOUS) ×3 IMPLANT
CHLORAPREP W/TINT 26 (MISCELLANEOUS) ×3 IMPLANT
CLIP APPLIE 5 13 M/L LIGAMAX5 (MISCELLANEOUS) ×1 IMPLANT
CLOSURE WOUND 1/2 X4 (GAUZE/BANDAGES/DRESSINGS) ×1
COVER MAYO STAND STRL (DRAPES) ×3 IMPLANT
COVER SURGICAL LIGHT HANDLE (MISCELLANEOUS) ×3 IMPLANT
COVER WAND RF STERILE (DRAPES) ×3 IMPLANT
DRAPE C-ARM 42X120 X-RAY (DRAPES) ×3 IMPLANT
DRSG TEGADERM 2-3/8X2-3/4 SM (GAUZE/BANDAGES/DRESSINGS) ×9 IMPLANT
DRSG TEGADERM 4X4.75 (GAUZE/BANDAGES/DRESSINGS) ×3 IMPLANT
ELECT REM PT RETURN 9FT ADLT (ELECTROSURGICAL) ×3
ELECTRODE REM PT RTRN 9FT ADLT (ELECTROSURGICAL) ×1 IMPLANT
ENDOLOOP SUT PDS II  0 18 (SUTURE) ×6
ENDOLOOP SUT PDS II 0 18 (SUTURE) ×3 IMPLANT
GAUZE SPONGE 2X2 8PLY STRL LF (GAUZE/BANDAGES/DRESSINGS) ×1 IMPLANT
GLOVE BIOGEL M STRL SZ7.5 (GLOVE) ×3 IMPLANT
GLOVE INDICATOR 8.0 STRL GRN (GLOVE) ×6 IMPLANT
GOWN STRL REUS W/ TWL LRG LVL3 (GOWN DISPOSABLE) ×3 IMPLANT
GOWN STRL REUS W/TWL 2XL LVL3 (GOWN DISPOSABLE) ×3 IMPLANT
GOWN STRL REUS W/TWL LRG LVL3 (GOWN DISPOSABLE) ×6
GRASPER SUT TROCAR 14GX15 (MISCELLANEOUS) ×3 IMPLANT
KIT BASIN OR (CUSTOM PROCEDURE TRAY) ×3 IMPLANT
KIT TURNOVER KIT B (KITS) ×3 IMPLANT
NS IRRIG 1000ML POUR BTL (IV SOLUTION) ×3 IMPLANT
PAD ARMBOARD 7.5X6 YLW CONV (MISCELLANEOUS) ×3 IMPLANT
POUCH RETRIEVAL ECOSAC 10 (ENDOMECHANICALS) ×1 IMPLANT
POUCH RETRIEVAL ECOSAC 10MM (ENDOMECHANICALS) ×2
SCISSORS LAP 5X35 DISP (ENDOMECHANICALS) ×3 IMPLANT
SET CHOLANGIOGRAPH 5 50 .035 (SET/KITS/TRAYS/PACK) ×3 IMPLANT
SET IRRIG TUBING LAPAROSCOPIC (IRRIGATION / IRRIGATOR) ×3 IMPLANT
SET TUBE SMOKE EVAC HIGH FLOW (TUBING) ×3 IMPLANT
SLEEVE ENDOPATH XCEL 5M (ENDOMECHANICALS) ×6 IMPLANT
SPECIMEN JAR SMALL (MISCELLANEOUS) ×3 IMPLANT
SPONGE GAUZE 2X2 STER 10/PKG (GAUZE/BANDAGES/DRESSINGS) ×2
STRIP CLOSURE SKIN 1/2X4 (GAUZE/BANDAGES/DRESSINGS) ×2 IMPLANT
SUT MNCRL AB 4-0 PS2 18 (SUTURE) ×3 IMPLANT
SUT VIC AB 0 UR5 27 (SUTURE) ×3 IMPLANT
SUT VICRYL 0 UR6 27IN ABS (SUTURE) IMPLANT
TOWEL GREEN STERILE (TOWEL DISPOSABLE) ×3 IMPLANT
TOWEL GREEN STERILE FF (TOWEL DISPOSABLE) ×3 IMPLANT
TRAY LAPAROSCOPIC MC (CUSTOM PROCEDURE TRAY) ×3 IMPLANT
TROCAR XCEL BLUNT TIP 100MML (ENDOMECHANICALS) ×3 IMPLANT
TROCAR XCEL NON-BLD 5MMX100MML (ENDOMECHANICALS) ×3 IMPLANT
WARMER LAPAROSCOPE (MISCELLANEOUS) ×3 IMPLANT
WATER STERILE IRR 1000ML POUR (IV SOLUTION) ×3 IMPLANT

## 2020-01-21 NOTE — Anesthesia Postprocedure Evaluation (Signed)
Anesthesia Post Note  Patient: Kenneth Coleman  Procedure(s) Performed: LAPAROSCOPIC CHOLECYSTECTOMY (Bilateral Abdomen)     Patient location during evaluation: PACU Anesthesia Type: General Level of consciousness: awake and alert Pain management: pain level controlled Vital Signs Assessment: post-procedure vital signs reviewed and stable Respiratory status: spontaneous breathing, nonlabored ventilation, respiratory function stable and patient connected to nasal cannula oxygen Cardiovascular status: blood pressure returned to baseline and stable Postop Assessment: no apparent nausea or vomiting Anesthetic complications: no   No complications documented.  Last Vitals:  Vitals:   01/21/20 1430 01/21/20 1445  BP: (!) 145/72 140/72  Pulse: 73 70  Resp: 15 12  Temp:    SpO2: 91% 100%    Last Pain:  Vitals:   01/21/20 1445  TempSrc:   PainSc: 8                  Griffith Santilli,Holly COKER

## 2020-01-21 NOTE — OR Nursing (Signed)
Pt is awake,alert and oriented.Pt and/or family verbalized understanding of poc and discharge instructions. Reviewed admission and on going care with receiving RN. Pt is in NAD at this time and is ready to be transferred to floor. Will con't to monitor until pt is transferred. Belongings on bed with patient Pt on 02 at  2l/min Pt does have pain but states needs to urinate and have a bm, refuses urinal and bedpan wants to walk to restroom.

## 2020-01-21 NOTE — Progress Notes (Signed)
PROGRESS NOTE    Larnce Schnackenberg  NTI:[id number] DOB: August 06, 1955 DOA: 01/15/2020 PCP: System, Provider Not In   Brief Narrative: Joby Richart is a 64 y.o. male with a history of COPD, chronic respiratory failure on 2.5 L, hypertension, coronary disease, chronic diastolic CHF. Patient presented secondary to dysphagia, abdominal pain and weight loss and found to have evidence of choledocholithiasis. On imaging, there was also evidence of esophogeal thickening for which he underwent EGD with negative biopsies. He was transferred to [facility] for ERCP.   Assessment & Plan:   Principal Problem:   Choledocholithiasis Active Problems:   CHF (congestive heart failure) (HCC)   COPD (chronic obstructive pulmonary disease) (HCC)   Coronary artery disease   Hypertension   Hypokalemia   Acute on chronic respiratory failure with hypoxia (HCC)   Lobar pneumonia (HCC)   Bile obstruction   Esophageal dysphagia   Choledocholithiasis Diagnosed on ultrasound. Patient with a CBD biliary stent, however, imaging concerning for recurrent biliary obstruction. Patient transferred to [facility] for ERCP. Alkaline phosphatase trending up with AST/ALT trending down. Bilirubin trended down slightly. ERCP performed on 10/13 with balloon sphincteroplasty and removal of occluded biliary stent and biliary stones/sludge -GI recommendations: cholecystectomy; signed off 10/14 -General surgery recommendations: plan for laparoscopic cholecystectomy today  Dysphagia In relation to esophageal thickening. S/p EGD as mentioned below.  Esophageal thickening Patient underwent Upper GI endoscopy with biopsies obtained which were significant for reactive squamous mucosa. No strictures/mass noted on EGD.  Malnutrition Albumin of 1.9. -Dietitian consult  Acute on chronic respiratory failure with hypoxia Patient is on 2-1/2 L of oxygen via nasal cannula as an outpatient.  Patient required up to 4 L of  oxygen via nasal cannula while inpatient with saturations down to 80%.  Appears to be secondary to possible aspiration pneumonitis versus pneumonia.  Patient is now weaned back to home 2 L of oxygen.  Left lower lobe pneumonia Likely related to aspiration.  Patient started on cefepime and Flagyl. MRSA pcr is positive. No leukocytosis or fever. 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 Non-bleeding.Treated.  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 AVMs reated with APC.  Chronic anemia Iron panel significant for low iron. Possible component of iron deficiency anemia. Patient does not have colon cancer screening performed. Anemia is currently stable. -Recommendations for colonoscopy as an outpatient with Dr. Jenetta Downer.  DVT prophylaxis: SCDs Code Status:   Code Status: Full Code Family Communication: None at bedside Disposition Plan: Discharge likely home in 24 hours pending general surgery recommendations post-op and PT recommendations   Consultants:   Gastroenterology  Procedures:   UPPER GI ENDOSCOPY (10/9)  ERCP (10/13)  Antimicrobials:  Cefepime  Flagyl   Augementin   Subjective: No issues this morning. Had a bowel movement.  Objective: Vitals:   01/20/20 2049 01/21/20 0442 01/21/20 0805 01/21/20 0841  BP:  (!) 100/57 115/61   Pulse:  (!) 57 (!) 54   Resp:  16 16   Temp:  (!) 97.5 F (36.4 C)  (!) 97.5 F (36.4 C)  TempSrc:  Oral  Oral  SpO2: 97% 100%  100%  Weight:      Height:        Intake/Output Summary (Last 24 hours) at 01/21/2020 1029 Last data filed at 01/21/2020 0205 Gross per 24 hour  Intake 4942.66 ml  Output 875 ml  Net 4067.66 ml  Filed Weights   01/17/20 1100  Weight: 70.3 kg    Examination:  General exam: Appears calm and comfortable. Thin appearing Respiratory system: Clear to  auscultation. Respiratory effort normal. Cardiovascular system: S1 & S2 heard, RRR. No murmurs, rubs, gallops or clicks. Gastrointestinal system: Abdomen is nondistended, soft and nontender. No organomegaly or masses felt. Normal bowel sounds heard. Central nervous system: Alert and oriented. No focal neurological deficits. Musculoskeletal: No edema. No calf tenderness Skin: No cyanosis. No rashes Psychiatry: Judgement and insight appear normal. Mood & affect appropriate.     Data Reviewed: I have personally reviewed following labs and imaging studies  CBC Lab Results  Component Value Date   WBC 6.6 01/21/2020   RBC 3.51 (L) 01/21/2020   HGB 8.6 (L) 01/21/2020   HCT 28.6 (L) 01/21/2020   MCV 81.5 01/21/2020   MCH 24.5 (L) 01/21/2020   PLT 532 (H) 01/21/2020   MCHC 30.1 01/21/2020   RDW 15.3 01/21/2020   LYMPHSABS 1.5 01/15/2020   MONOABS 0.9 01/15/2020   EOSABS 0.0 01/15/2020   BASOSABS 0.0 27/25/3664     Last metabolic panel Lab Results  Component Value Date   NA 133 (L) 01/21/2020   K 4.8 01/21/2020   CL 98 01/21/2020   CO2 26 01/21/2020   BUN 9 01/21/2020   CREATININE 0.41 (L) 01/21/2020   GLUCOSE 102 (H) 01/21/2020   GFRNONAA >60 01/21/2020   CALCIUM 8.4 (L) 01/21/2020   PROT 6.4 (L) 01/21/2020   ALBUMIN 1.9 (L) 01/21/2020   BILITOT 1.2 01/21/2020   ALKPHOS 561 (H) 01/21/2020   AST 45 (H) 01/21/2020   ALT 51 (H) 01/21/2020   ANIONGAP 9 01/21/2020    CBG (last 3)  No results for input(s): GLUCAP in the last 72 hours.   GFR: Estimated Creatinine Clearance: 91.5 mL/min (A) (by C-G formula based on SCr of 0.41 mg/dL (L)).  Coagulation Profile: Recent Labs  Lab 01/18/20 1154  INR 1.0    Recent Results (from the past 240 hour(s))  Resp Panel by RT PCR (RSV, Flu A&B, Covid) - Nasopharyngeal Swab     Status: None   Collection Time: 01/15/20  1:48 PM   Specimen: Nasopharyngeal Swab  Result Value Ref Range Status   SARS Coronavirus 2 by RT PCR NEGATIVE  NEGATIVE Final    Comment: (NOTE) SARS-CoV-2 target nucleic acids are NOT DETECTED.  The SARS-CoV-2 RNA is generally detectable in upper respiratoy specimens during the acute phase of infection. The lowest concentration of SARS-CoV-2 viral copies this assay can detect is 131 copies/mL. A negative result does not preclude SARS-Cov-2 infection and should not be used as the sole basis for treatment or other patient management decisions. A negative result may occur with  improper specimen collection/handling, submission of specimen other than nasopharyngeal swab, presence of viral mutation(s) within the areas targeted by this assay, and inadequate number of viral copies (<131 copies/mL). A negative result must be combined with clinical observations, patient history, and epidemiological information. The expected result is Negative.  Fact Sheet for Patients:  PinkCheek.be  Fact Sheet for Healthcare Providers:  GravelBags.it  This test is no t yet approved or cleared by the [place] FDA and  has been authorized for detection and/or diagnosis of SARS-CoV-2 by FDA under an Emergency Use Authorization (EUA). This EUA will remain  in effect (meaning this test can be used) for the duration of the COVID-19 declaration under Section 564(b)(1) of the Act, 21 U.S.C. section 360bbb-3(b)(1), unless the authorization  is terminated or revoked sooner.     Influenza A by PCR NEGATIVE NEGATIVE Final   Influenza B by PCR NEGATIVE NEGATIVE Final    Comment: (NOTE) The Xpert Xpress SARS-CoV-2/FLU/RSV assay is intended as an aid in  the diagnosis of influenza from Nasopharyngeal swab specimens and  should not be used as a sole basis for treatment. Nasal washings and  aspirates are unacceptable for Xpert Xpress SARS-CoV-2/FLU/RSV  testing.  Fact Sheet for Patients: PinkCheek.be  Fact Sheet for Healthcare  Providers: GravelBags.it  This test is not yet approved or cleared by the [place] FDA and  has been authorized for detection and/or diagnosis of SARS-CoV-2 by  FDA under an Emergency Use Authorization (EUA). This EUA will remain  in effect (meaning this test can be used) for the duration of the  Covid-19 declaration under Section 564(b)(1) of the Act, 21  U.S.C. section 360bbb-3(b)(1), unless the authorization is  terminated or revoked.    Respiratory Syncytial Virus by PCR NEGATIVE NEGATIVE Final    Comment: (NOTE) Fact Sheet for Patients: PinkCheek.be  Fact Sheet for Healthcare Providers: GravelBags.it  This test is not yet approved or cleared by the [place] FDA and  has been authorized for detection and/or diagnosis of SARS-CoV-2 by  FDA under an Emergency Use Authorization (EUA). This EUA will remain  in effect (meaning this test can be used) for the duration of the  COVID-19 declaration under Section 564(b)(1) of the Act, 21 U.S.C.  section 360bbb-3(b)(1), unless the authorization is terminated or  revoked. Performed at [facility], [address] [place], [place] [address]   MRSA PCR Screening     Status: Abnormal   Collection Time: 01/17/20 12:58 PM   Specimen: Nasopharyngeal  Result Value Ref Range Status   MRSA by PCR POSITIVE (A) NEGATIVE Final    Comment:        The GeneXpert MRSA Assay (FDA approved for NASAL specimens only), is one component of a comprehensive MRSA colonization surveillance program. It is not intended to diagnose MRSA infection nor to guide or monitor treatment for MRSA infections. RESULT CALLED TO, READ BACK BY AND VERIFIED WITH: L BULLINS AT 1723 ON 01/17/2020 BY MOSLEY,J Performed at [facility], [address] [place], [place] [address]         Radiology Studies: No results found.      Scheduled Meds: . [MAR Hold]  amoxicillin-clavulanate  1 tablet Oral Q12H  . [MAR Hold] budesonide (PULMICORT) nebulizer solution  0.5 mg Nebulization BID  . [MAR Hold] carvedilol  6.25 mg Oral BID  . [MAR Hold] chlorhexidine  15 mL Mouth/Throat NOW  . [MAR Hold] Chlorhexidine Gluconate Cloth  6 each Topical Q0600  . [MAR Hold] digoxin  125 mcg Oral Daily  . [MAR Hold] docusate sodium  100 mg Oral Daily  . [MAR Hold] feeding supplement  237 mL Oral TID BM  . [MAR Hold] indomethacin  100 mg Rectal Once  . [MAR Hold] mupirocin ointment  1 application Nasal BID  . [MAR Hold] pantoprazole  40 mg Oral Daily  . [MAR Hold] potassium chloride  40 mEq Oral BID  . [MAR Hold] spironolactone  25 mg Oral Daily   Continuous Infusions: . sodium chloride 50 mL/hr at 01/21/20 0433  . [MAR Hold] cefTRIAXone (ROCEPHIN)  IV    . lactated ringers       LOS: 6 days     Cordelia Poche, MD [facility] 01/21/2020, 10:29 AM  If 7PM-7AM, please  contact night-coverage [email or web address]

## 2020-01-21 NOTE — Progress Notes (Signed)
Nutrition Follow-up  DOCUMENTATION CODES:   Not applicable  INTERVENTION:   -Boost Breeze po TID, each supplement provides 250 kcal and 9 grams of protein -30 ml Prosource Plus BID, each supplement provides 100 kcals and 15 grams protein -MVI with minerals daily -RD will follow for diet advancement and adjust supplement regimen as appropriate  NUTRITION DIAGNOSIS:   Inadequate oral intake related to dysphagia as evidenced by per patient/family report.  Ongoing  GOAL:   Patient will meet greater than or equal to 90% of their needs  Progressing   MONITOR:   PO intake, Supplement acceptance, Labs, Weight trends, I & O's  REASON FOR ASSESSMENT:   Consult Assessment of nutrition requirement/status  ASSESSMENT:   64 year old male with a history of COPD, chronic respiratory failure on 2.5 L, hypertension, coronary disease, CHF presenting with dysphagia, abdominal pain, weight loss, and generalized weakness.  10/13- s/p ERCP- revealed LA grade A and grade B esophagitis with no bleeding; blood in gastric antrum and in gastric body due to evidence of AVMs; open prior biliary sphincterotomy; visibly occluded biliary stent removed 10/14- s/p lap chole  Reviewed I/O's: +3.8 L x 24 hours and +3.9 L since admission  UOP: 1.2 L x 24 hours  Pt down in OR at time of attempted visits. Unable to obtain further nutrition-related history or complete nutrition-focused physical exam at this time.   Noted pt with fair oral intake previously on dysphagia 3 diet (meal completion 50%). Pt NPO when RD evaluated, but no on clear liquids. Pt would greatly benefit from addition of oral nutrition supplements.  Medications reviewed and include colace and 0.9% sodium chloride infusion @ 50 ml/hr.   Labs reviewed: Na: 133.   Diet Order:   Diet Order            Diet clear liquid Room service appropriate? Yes; Fluid consistency: Thin  Diet effective now                 EDUCATION NEEDS:    No education needs have been identified at this time  Skin:  Skin Assessment: Reviewed RN Assessment  Last BM:  01/21/20  Height:   Ht Readings from Last 1 Encounters:  01/17/20 5\' 9"  (1.753 m)    Weight:   Wt Readings from Last 1 Encounters:  01/17/20 70.3 kg    Ideal Body Weight:     BMI:  Body mass index is 22.89 kg/m.  Estimated Nutritional Needs:   Kcal:  1800-2000  Protein:  85-100g  Fluid:  2L/day    Loistine Chance, RD, LDN, [place] Registered Dietitian II Certified Diabetes Care and Education Specialist Please refer to [facility] for RD and/or RD on-call/weekend/after hours pager

## 2020-01-21 NOTE — Anesthesia Preprocedure Evaluation (Addendum)
Anesthesia Evaluation  Patient identified by MRN, date of birth, ID band Patient awake    Reviewed: Allergy & Precautions, NPO status , Patient's Chart, lab work & pertinent test results  Airway Mallampati: II  TM Distance: >3 FB Neck ROM: Full    Dental  (+) Edentulous Upper, Poor Dentition, Dental Advisory Given   Pulmonary Current Smoker,     + decreased breath sounds      Cardiovascular hypertension,  Rhythm:Regular Rate:Normal     Neuro/Psych    GI/Hepatic   Endo/Other    Renal/GU      Musculoskeletal   Abdominal   Peds  Hematology   Anesthesia Other Findings   Reproductive/Obstetrics                            Anesthesia Physical Anesthesia Plan  ASA: III  Anesthesia Plan: General   Post-op Pain Management:    Induction: Intravenous  PONV Risk Score and Plan: Ondansetron and Dexamethasone  Airway Management Planned: Oral ETT  Additional Equipment:   Intra-op Plan:   Post-operative Plan: Extubation in OR  Informed Consent: I have reviewed the patients History and Physical, chart, labs and discussed the procedure including the risks, benefits and alternatives for the proposed anesthesia with the patient or authorized representative who has indicated his/her understanding and acceptance.       Plan Discussed with: Anesthesiologist and CRNA  Anesthesia Plan Comments:         Anesthesia Quick Evaluation

## 2020-01-21 NOTE — Transfer of Care (Signed)
Immediate Anesthesia Transfer of Care Note  Patient: Kenneth Coleman  Procedure(s) Performed: LAPAROSCOPIC CHOLECYSTECTOMY (Bilateral Abdomen)  Patient Location: PACU  Anesthesia Type:General  Level of Consciousness: awake, alert , oriented and patient cooperative  Airway & Oxygen Therapy: Patient Spontanous Breathing and Patient connected to face mask oxygen  Post-op Assessment: Report given to RN and Post -op Vital signs reviewed and stable  Post vital signs: Reviewed and stable  Last Vitals:  Vitals Value Taken Time  BP 128/86 01/21/20 1411  Temp 37.2 C 01/21/20 1410  Pulse 75 01/21/20 1413  Resp 15 01/21/20 1413  SpO2 100 % 01/21/20 1413  Vitals shown include unvalidated device data.  Last Pain:  Vitals:   01/21/20 1410  TempSrc:   PainSc: Asleep      Patients Stated Pain Goal: 2 (01/58/68 2574)  Complications: No complications documented.

## 2020-01-21 NOTE — Consult Note (Addendum)
[facility] Surgery Consult Note  Kenneth Coleman Aug 27, 1955  [id number].    Requesting MD:  Ewing Schlein Chief Complaint: abdominal pain, weight loss for 2 months Reason for Consult: Cholecystectomy for cholelithiasis/cholecystitis NO PCP listed  HPI:  Pt is a 64 y/o male who presents with abdominal pain and weight loss.  He has pain constantly but worse with eating.  He has had nausea, but no vomiting, he feels like he want to vomit after liquids at times.  Patient has a history of oxygen dependent COPD x 2 yrs, hypertension,CHF with some rhythm issue, now resolved on medicines.  He underwent ERCP with stent placement in 2010 in [place], [place]. He said it was for pancreatitis.  He reports he did not know he had a stent, and was apparently lost to follow-up.  He also had a complaint of dysphagia.  History of EGD 01/16/2020: this showed some esophagitis,   a 4 cm hiatal hernia stomach was normal. Duodenal bulb angiectasia x 2, nonbleeding.  No mention of second portion of the duodenum or ampulla in this report.  Yesterday he underwent ERCP with removal of the occluded stent from 2010 by DR. Mansouraty.  We were asked to see in consultation for possible cholecystectomy.  Post ERCP he is afebrile vital signs are stable.  O2 sats are stable on nasal cannula 2-3 L/min.  Labs shows a sodium of 133, glucose of 102, creatinine 0.41, alk phos 561, AST109>>45; ALT78>>51; total bilirubin 2.3>> 1.2. WBC 19.6>> 6.6.  CT the abdomen pelvis with contrast 01/15/2020 moderate intra and extrahepatic biliary and gallbladder dilatation suspicious for recurrent biliary obstruction.  A plastic biliary stent was in place which is likely nonfunctional as a possible calculus in the common hepatic duct adjacent to the stent versus stent encrustation.  There is an 11 mm soft tissue nodule at the hepatic hilum which could be intrabiliary. Abdominal ultrasound 10/8:This ultrasound, which was performed immediately prior  to abdominal CT, does not optimally portray the perceived abnormalities on CT. Combining the 2 studies, there is moderate intra and extrahepatic biliary dilatation in this patient with a biliary stent. There is a probable intraductal filling defect adjacent to the proximal aspect of the stent consistent with choledocholithiasis.   CT of the chest 10/9:Multifocal endobronchial debris and mucus plugging most with new heterogeneous opacification of the LEFT lower lobe. Findings are concerning for aspiration and aspiration pneumonitis.2. Centrilobular nodularity in the LEFT upper lobe and RIGHT lower, likely infectious or inflammatory. 3. Scattered pulmonary nodules measuring up to 4 mm.  CT soft tissue the neck with contrast 10/9: 1. Mild thickening of the upper esophagus without a discrete mass. No pneumomediastinum. 2. No focal mucosal or submucosal lesions. 3. No significant cervical adenopathy. 4. Moderate to high-grade stenosis of the proximal right internal carotid artery. 5. Multilevel degenerative changes of the cervical spine as described. 6. Dental caries. 7. Chronic right sphenoid sinus disease.  NPO since 6 AM    ROS: Review of Systems  Constitutional: Positive for weight loss (40 pounds last 2 months).  HENT: Negative.   Eyes: Negative.   Respiratory: Positive for cough and wheezing.        He was ok and doing a garden, cutting grass at home on his O2  Cardiovascular: Negative.   Gastrointestinal: Positive for abdominal pain (2 months), constipation, heartburn (rare) and nausea. Negative for blood in stool and vomiting.       He never had a colonoscopy  Genitourinary: Negative.   Musculoskeletal: Negative.  Skin: Negative.   Neurological: Negative.   Endo/Heme/Allergies: Negative.   Psychiatric/Behavioral: Negative.    History reviewed. No pertinent family history.  Past Medical History:  Diagnosis Date  . CHF (congestive heart failure) ([place])   . COPD (chronic  obstructive pulmonary disease) ([place])   . Coronary artery disease   . Hypertension     History reviewed. No pertinent surgical history.  Social History:  reports that he has been smoking cigarettes. He has never used smokeless tobacco. He reports that he does not drink alcohol and does not use drugs.  Allergies: No Known Allergies  Medications Prior to Admission  Medication Sig Dispense Refill  . ASPIRIN LOW DOSE 81 MG EC tablet Take 81 mg by mouth daily.    Marland Kitchen BEVESPI AEROSPHERE 9-4.8 MCG/ACT AERO Take 2 puffs by mouth daily.     . cholecalciferol (VITAMIN D) 25 MCG (1000 UNIT) tablet Take 1,000 Units by mouth daily.    . furosemide (LASIX) 20 MG tablet Take 20 mg by mouth daily.    Marland Kitchen spironolactone (ALDACTONE) 25 MG tablet Take 25 mg by mouth daily.    . carvedilol (COREG) 12.5 MG tablet Take 12.5 mg by mouth 2 (two) times daily.    . digoxin (LANOXIN) 0.125 MG tablet Take 125 mcg by mouth daily.    Marland Kitchen HYDROcodone-acetaminophen (LORTAB) 7.5-500 MG/15ML solution Take 10 mLs by mouth every 6 (six) hours as needed for pain. (Patient not taking: Reported on 01/15/2020) 120 mL 0  . lisinopril (ZESTRIL) 2.5 MG tablet Take 2.5 mg by mouth at bedtime.      Blood pressure (!) 100/57, pulse (!) 57, temperature (!) 97.5 F (36.4 C), temperature source Oral, resp. rate 16, height $RemoveBe'5\' 9"'mZKOFkRJm$  (1.753 m), weight 70.3 kg, SpO2 100 %. Physical Exam:  General: pleasant, cachectic AA male who is laying in bed in NAD HEENT: head is normocephalic, atraumatic.  Sclera are noninjected. Pupils are equal.  Ears and nose without any masses or lesions.  Mouth is pink and moist Heart: regular, rate, and rhythm.  Normal s1,s2. No obvious murmurs, gallops, or rubs noted.  Palpable radial and pedal pulses bilaterally Lungs: CTAB, no wheezes, rhonchi, or rales noted.  Respiratory effort nonlabored Abd: soft, No tenderness this AM, he points to RUQ as source of pain before, ND, +BS, no masses, hernias, or organomegaly, no  surgical scars MS: all 4 extremities are symmetrical with no cyanosis, clubbing, or edema. Skin: warm and dry with no masses, lesions, or rashes Neuro: Cranial nerves 2-12 grossly intact, sensation is normal throughout Psych: A&Ox3 with an appropriate affect.   Results for orders placed or performed during the hospital encounter of 01/15/20 (from the past 48 hour(s))  CBC     Status: Abnormal   Collection Time: 01/20/20  4:01 AM  Result Value Ref Range   WBC 7.9 4.0 - 10.5 K/uL   RBC 3.54 (L) 4.22 - 5.81 MIL/uL   Hemoglobin 8.8 (L) 13.0 - 17.0 g/dL   HCT 28.7 (L) 39 - 52 %   MCV 81.1 80.0 - 100.0 fL   MCH 24.9 (L) 26.0 - 34.0 pg   MCHC 30.7 30.0 - 36.0 g/dL   RDW 15.3 11.5 - 15.5 %   Platelets 552 (H) 150 - 400 K/uL   nRBC 0.0 0.0 - 0.2 %    Comment: Performed at [facility], [address] [place], [place] [address]  Comprehensive metabolic panel     Status: Abnormal   Collection Time:  01/20/20  4:01 AM  Result Value Ref Range   Sodium 133 (L) 135 - 145 mmol/L   Potassium 4.8 3.5 - 5.1 mmol/L   Chloride 98 98 - 111 mmol/L   CO2 29 22 - 32 mmol/L   Glucose, Bld 85 70 - 99 mg/dL    Comment: Glucose reference range applies only to samples taken after fasting for at least 8 hours.   BUN <5 (L) 8 - 23 mg/dL   Creatinine, Ser 0.37 (L) 0.61 - 1.24 mg/dL   Calcium 8.5 (L) 8.9 - 10.3 mg/dL   Total Protein 6.7 6.5 - 8.1 g/dL   Albumin 1.9 (L) 3.5 - 5.0 g/dL   AST 90 (H) 15 - 41 U/L   ALT 70 (H) 0 - 44 U/L   Alkaline Phosphatase 679 (H) 38 - 126 U/L   Total Bilirubin 1.8 (H) 0.3 - 1.2 mg/dL   GFR, Estimated >60 >60 mL/min   Anion gap 6 5 - 15    Comment: Performed at [facility], [place] [address] [place], [place] [address]  Iron and TIBC     Status: Abnormal   Collection Time: 01/20/20  6:47 PM  Result Value Ref Range   Iron 42 (L) 45 - 182 ug/dL   TIBC 203 (L) 250 - 450 ug/dL   Saturation Ratios 21 17.9 - 39.5 %   UIBC 161 ug/dL    Comment: Performed at [facility], [place] [address] [place], [place] [address]  Ferritin     Status: None   Collection Time: 01/20/20  6:47 PM  Result Value Ref Range   Ferritin 85 24 - 336 ng/mL    Comment: Performed at [facility] [address] [place], [place] [address]  CBC     Status: Abnormal   Collection Time: 01/21/20  4:45 AM  Result Value Ref Range   WBC 6.6 4.0 - 10.5 K/uL   RBC 3.51 (L) 4.22 - 5.81 MIL/uL   Hemoglobin 8.6 (L) 13.0 - 17.0 g/dL   HCT 28.6 (L) 39 - 52 %   MCV 81.5 80.0 - 100.0 fL   MCH 24.5 (L) 26.0 - 34.0 pg   MCHC 30.1 30.0 - 36.0 g/dL   RDW 15.3 11.5 - 15.5 %   Platelets 532 (H) 150 - 400 K/uL   nRBC 0.0 0.0 - 0.2 %    Comment: Performed at [facility] [address] [place], [place] [address]  Comprehensive metabolic panel     Status: Abnormal   Collection Time: 01/21/20  4:45 AM  Result Value Ref Range   Sodium 133 (L) 135 - 145 mmol/L   Potassium 4.8 3.5 - 5.1 mmol/L   Chloride 98 98 - 111 mmol/L   CO2 26 22 - 32 mmol/L   Glucose, Bld 102 (H) 70 - 99 mg/dL    Comment: Glucose reference range applies only to samples taken after fasting for at least 8 hours.   BUN 9 8 - 23 mg/dL   Creatinine, Ser 0.41 (L) 0.61 - 1.24 mg/dL   Calcium 8.4 (L) 8.9 - 10.3 mg/dL   Total Protein 6.4 (L) 6.5 - 8.1 g/dL   Albumin 1.9 (L) 3.5 - 5.0 g/dL   AST 45 (H) 15 - 41 U/L   ALT 51 (H) 0 - 44 U/L   Alkaline Phosphatase 561 (H) 38 - 126 U/L   Total Bilirubin 1.2 0.3 - 1.2 mg/dL   GFR, Estimated >  60 >60 mL/min   Anion gap 9 5 - 15    Comment: Performed at [facility] [address], [place], [place] [address]   No results found. . 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 ID: Maxipime 10/9-10/13; Flagyl 10/10-10/13; Augmentin 10/13>> day 2 DVT: SCDs Follow-up: TBD  Plan: Dr. Redmond Pulling has reviewed the CT and agree's with recommendation for cholecystectomy. He had some ice cream around 5 AM, but has been NPO since.  Will aim to take to the OR later this today after lunch.  Risk and benefits discussed and he is agreeable.  I have also talked with Dr. Teryl Lucy and he does not think he needs any further treatment for his pneumonitis before surgery.  He has been on antibiotic Rx since 01/16/20.  Earnstine Regal [facility] Surgery 01/21/2020, 7:12 AM Please see Amion for pager number during day hours 7:00am-4:30pm

## 2020-01-21 NOTE — OR Nursing (Signed)
Received report from lunch break nurse.

## 2020-01-21 NOTE — Progress Notes (Signed)
Daily Rounding Note  01/21/2020, 9:41 AM  LOS: 6 days   SUBJECTIVE:   Chief complaint: Choledocholithiasis.  Occluded 64 year old biliary stent. Patient complaining of pain in his throat.  No abdominal pain.  However he has not eaten anything significant for the past 24 hours and his abdominal pain has been primarily postprandial.  Passing stools.  OBJECTIVE:         Vital signs in last 24 hours:    Temp:  [97.5 F (36.4 C)-98 F (36.7 C)] 97.5 F (36.4 C) (10/14 0841) Pulse Rate:  [54-80] 54 (10/14 0805) Resp:  [16-19] 16 (10/14 0805) BP: (100-170)/(57-76) 115/61 (10/14 0805) SpO2:  [95 %-100 %] 100 % (10/14 0841) Last BM Date: 01/21/20 Filed Weights   01/17/20 1100  Weight: 70.3 kg   General: Thin.  Comfortable Heart: RRR Chest: Clear bilaterally Abdomen: Nontender.  Active bowel sounds.  Nondistended Extremities: No CCE. Neuro/Psych: Alert.  Oriented x3.  Appropriate.  No tremors, no gross deficits.  Intake/Output from previous day: 10/13 0701 - 10/14 0700 In: 4942.7 [I.V.:4350; IV Piggyback:492.7] Out: 1175 [Urine:1175]  Intake/Output this shift: No intake/output data recorded.  Lab Results: Recent Labs    01/19/20 0141 01/20/20 0401 01/21/20 0445  WBC 7.6 7.9 6.6  HGB 8.7* 8.8* 8.6*  HCT 28.5* 28.7* 28.6*  PLT 485* 552* 532*   BMET Recent Labs    01/19/20 0141 01/20/20 0401 01/21/20 0445  NA 134* 133* 133*  K 4.6 4.8 4.8  CL 100 98 98  CO2 28 29 26   GLUCOSE 103* 85 102*  BUN <5* <5* 9  CREATININE 0.41* 0.37* 0.41*  CALCIUM 8.3* 8.5* 8.4*   LFT Recent Labs    01/19/20 0141 01/20/20 0401 01/21/20 0445  PROT 6.5 6.7 6.4*  ALBUMIN 1.9* 1.9* 1.9*  AST 100* 90* 45*  ALT 75* 70* 51*  ALKPHOS 657* 679* 561*  BILITOT 1.9* 1.8* 1.2   PT/INR Recent Labs    01/18/20 1154  LABPROT 12.8  INR 1.0   Hepatitis Panel No results for input(s): HEPBSAG, HCVAB, HEPAIGM, HEPBIGM in the  last 72 hours.  Studies/Results: No results found. Scheduled Meds: . amoxicillin-clavulanate  1 tablet Oral Q12H  . budesonide (PULMICORT) nebulizer solution  0.5 mg Nebulization BID  . carvedilol  6.25 mg Oral BID  . Chlorhexidine Gluconate Cloth  6 each Topical Q0600  . digoxin  125 mcg Oral Daily  . docusate sodium  100 mg Oral Daily  . feeding supplement  237 mL Oral TID BM  . indomethacin  100 mg Rectal Once  . mupirocin ointment  1 application Nasal BID  . pantoprazole  40 mg Oral Daily  . potassium chloride  40 mEq Oral BID  . spironolactone  25 mg Oral Daily   Continuous Infusions: . sodium chloride 50 mL/hr at 01/21/20 0433  . cefTRIAXone (ROCEPHIN)  IV     PRN Meds:.HYDROmorphone (DILAUDID) injection, ipratropium-albuterol, ondansetron **OR** ondansetron (ZOFRAN) IV, oxyCODONE  ASSESMENT:   *    Choledocholithiasis.  ERCP and stent placement 2010 in [place].  Developed stent occlusion. 01/20/2020 ERCP with incidental finding of nonbleeding moderate esophagitis.  There was red blood, hematin in the stomach, after lavage gastric AVMs noted and treated with APC.  Duodenal bulb AVMs with stigmata of recent bleeding also treated with APC.  Balloon sphincteroplasty performed on open previous biliary sphincterotomy.  Visible, occluded biliary stent was removed.  Balloon sweep performed with removal of choledocholithiasis and  sludge.  Abx d 6.  Cefepime/metronidazole x 4 d >> day 2 Augmentin  *     Normocytic anemia.  Low iron, low TIBC.  Iron sats, ferritin, folate, B12 okay  PLAN   *     Plan by surgery is for laparoscopic cholecystectomy today. GI signing off.    *    Continue daily PPI  *   Regarding anemia and lack of prior colon cancer screening, should return to Dr. Montez Morita in [place] for screening colonoscopy.  This can be done in the next few months.    Kenneth Coleman  01/21/2020, 9:41 AM Phone [phone]

## 2020-01-21 NOTE — Anesthesia Procedure Notes (Signed)
Procedure Name: Intubation Date/Time: 01/21/2020 12:44 PM Performed by: Cleda Daub, CRNA Pre-anesthesia Checklist: Patient identified, Emergency Drugs available, Suction available and Patient being monitored Patient Re-evaluated:Patient Re-evaluated prior to induction Oxygen Delivery Method: Circle system utilized Preoxygenation: Pre-oxygenation with 100% oxygen Induction Type: IV induction Ventilation: Mask ventilation without difficulty Laryngoscope Size: Miller and 2 Grade View: Grade I Tube type: Oral Tube size: 7.5 mm Number of attempts: 1 Airway Equipment and Method: Stylet and Oral airway Placement Confirmation: ETT inserted through vocal cords under direct vision,  positive ETCO2 and breath sounds checked- equal and bilateral Secured at: 21 cm Tube secured with: Tape Dental Injury: Teeth and Oropharynx as per pre-operative assessment

## 2020-01-21 NOTE — Op Note (Signed)
Kenneth Coleman [id number] 10-30-1955 01/21/2020  Laparoscopic Cholecystectomy  Procedure Note  Indications: This patient presents with symptomatic gallbladder disease and will undergo laparoscopic cholecystectomy.  The patient had presented with postprandial pain, dysphagia and progressive weight loss.  Patient had underwent ERCP and bile duct stent placement in 2010 for choledocholithiasis from what we can tell from the records and he was lost to follow-up.  He came in with dilated ducts and a occluded common bile duct stent.  He was taken for ERCP by GI medicine along with upper endoscopy.  His common bile duct stent was occluded and he underwent sphincterotomy with removal of common bile duct stones.  We were then asked to see in consultation for cholecystectomy.  Please see additional information in chart regarding discussion about risk and benefits of the procedure.  Pre-operative Diagnosis: symptomatic cholelithiasis; choledocholithiasis s/p ERCP 10/13  Post-operative Diagnosis: Same  Surgeon: Greer Pickerel MD FACS  Assistants: Pryor Curia RNFA  Anesthesia: General endotracheal anesthesia  Procedure Details  The patient was seen again in the Holding Room. The risks, benefits, complications, treatment options, and expected outcomes were discussed with the patient. The possibilities of reaction to medication, pulmonary aspiration, perforation of viscus, bleeding, recurrent infection, finding a normal gallbladder, the need for additional procedures, failure to diagnose a condition, the possible need to convert to an open procedure, and creating a complication requiring transfusion or operation were discussed with the patient. The likelihood of improving the patient's symptoms with return to their baseline status is good.  The patient and/or family concurred with the proposed plan, giving informed consent. The site of surgery properly noted. The patient was taken to Operating Room, identified  as Kenneth Coleman and the procedure verified as Laparoscopic Cholecystectomy. A Time Out was held and the above information confirmed. Antibiotic prophylaxis was administered.   Prior to the induction of general anesthesia, antibiotic prophylaxis was administered. General endotracheal anesthesia was then administered and tolerated well. After the induction, the abdomen was prepped with Chloraprep and draped in the sterile fashion. The patient was positioned in the supine position.  Local anesthetic agent was injected into the skin near the umbilicus and an incision made. We dissected down to the abdominal fascia with blunt dissection.  The fascia was incised vertically and we entered the peritoneal cavity bluntly.  A pursestring suture of 0-Vicryl was placed around the fascial opening.  The Hasson cannula was inserted and secured with the stay suture.  Pneumoperitoneum was then created with CO2 and tolerated well without any adverse changes in the patient's vital signs. An 5-mm port was placed in the subxiphoid position.  Two 5-mm ports were placed in the right upper quadrant. All skin incisions were infiltrated with a local anesthetic agent before making the incision and placing the trocars.   We positioned the patient in reverse Trendelenburg, tilted slightly to the patient's left.  The gallbladder was identified.  He had a very long distended floppy gallbladder.  The fundus grasped and retracted cephalad. Adhesions were lysed bluntly and with the electrocautery where indicated, taking care not to injure any adjacent organs or viscus. The infundibulum was grasped and retracted laterally, exposing the peritoneum overlying the triangle of Calot. This was then divided and exposed in a blunt fashion. A large critical view of the cystic duct and cystic artery was obtained.  The cystic duct was clearly identified and bluntly dissected circumferentially.   The cystic duct was then ligated with clip and divided.   I did place a  PDS Endoloop around the cystic duct stump remnant because the cystic duct was chronically dilated and the clip did not go all the way completely across it.  The cystic artery which had been identified & dissected free was ligated with clips and divided as well.   The gallbladder was dissected from the liver bed in retrograde fashion with the electrocautery. The gallbladder was removed and placed in an Ecco sac.  The gallbladder and Ecco sac were then removed through the umbilical port site. The liver bed was irrigated and inspected. Hemostasis was achieved with the electrocautery. Copious irrigation was utilized and was repeatedly aspirated until clear.  The pursestring suture was used to close the umbilical fascia.  I placed an additional interrupted 0 Vicryl at the umbilical fascia using the PMI suture passer with laparoscopic guidance  We again inspected the right upper quadrant for hemostasis.  The umbilical closure was inspected and there was no air leak and nothing trapped within the closure. Pneumoperitoneum was released as we removed the trocars.  4-0 Monocryl was used to close the skin.   Dermabond was applied. The patient was then extubated and brought to the recovery room in stable condition. Instrument, sponge, and needle counts were correct at closure and at the conclusion of the case.   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         Disposition: PACU - hemodynamically stable.         Condition: stable  Kenneth Coleman. Redmond Pulling, MD, FACS General, Bariatric, & Minimally Invasive Surgery [facility] Surgery, [place]

## 2020-01-22 ENCOUNTER — Encounter (HOSPITAL_COMMUNITY): Payer: Self-pay | Admitting: General Surgery

## 2020-01-22 DIAGNOSIS — K831 Obstruction of bile duct: Secondary | ICD-10-CM | POA: Diagnosis not present

## 2020-01-22 DIAGNOSIS — J9621 Acute and chronic respiratory failure with hypoxia: Secondary | ICD-10-CM | POA: Diagnosis not present

## 2020-01-22 DIAGNOSIS — J181 Lobar pneumonia, unspecified organism: Secondary | ICD-10-CM | POA: Diagnosis not present

## 2020-01-22 DIAGNOSIS — K8050 Calculus of bile duct without cholangitis or cholecystitis without obstruction: Secondary | ICD-10-CM | POA: Diagnosis not present

## 2020-01-22 LAB — CBC
HCT: 30.7 % — ABNORMAL LOW (ref 39.0–52.0)
Hemoglobin: 9.1 g/dL — ABNORMAL LOW (ref 13.0–17.0)
MCH: 24.7 pg — ABNORMAL LOW (ref 26.0–34.0)
MCHC: 29.6 g/dL — ABNORMAL LOW (ref 30.0–36.0)
MCV: 83.4 fL (ref 80.0–100.0)
Platelets: 623 10*3/uL — ABNORMAL HIGH (ref 150–400)
RBC: 3.68 MIL/uL — ABNORMAL LOW (ref 4.22–5.81)
RDW: 15.7 % — ABNORMAL HIGH (ref 11.5–15.5)
WBC: 11.7 10*3/uL — ABNORMAL HIGH (ref 4.0–10.5)
nRBC: 0 % (ref 0.0–0.2)

## 2020-01-22 LAB — COMPREHENSIVE METABOLIC PANEL
ALT: 47 U/L — ABNORMAL HIGH (ref 0–44)
AST: 39 U/L (ref 15–41)
Albumin: 2 g/dL — ABNORMAL LOW (ref 3.5–5.0)
Alkaline Phosphatase: 530 U/L — ABNORMAL HIGH (ref 38–126)
Anion gap: 8 (ref 5–15)
BUN: 10 mg/dL (ref 8–23)
CO2: 31 mmol/L (ref 22–32)
Calcium: 8.5 mg/dL — ABNORMAL LOW (ref 8.9–10.3)
Chloride: 94 mmol/L — ABNORMAL LOW (ref 98–111)
Creatinine, Ser: 0.59 mg/dL — ABNORMAL LOW (ref 0.61–1.24)
GFR, Estimated: >60 mL/min (ref >60)
Glucose, Bld: 155 mg/dL — ABNORMAL HIGH (ref 70–99)
Potassium: 4.9 mmol/L (ref 3.5–5.1)
Sodium: 133 mmol/L — ABNORMAL LOW (ref 135–145)
Total Bilirubin: 1.3 mg/dL — ABNORMAL HIGH (ref 0.3–1.2)
Total Protein: 6.6 g/dL (ref 6.5–8.1)

## 2020-01-22 LAB — SURGICAL PATHOLOGY

## 2020-01-22 MED ORDER — ENSURE ENLIVE PO LIQD
237.0000 mL | Freq: Three times a day (TID) | ORAL | Status: DC
  Administered 2020-01-22 – 2020-01-25 (×9): 237 mL via ORAL

## 2020-01-22 MED ORDER — ACETAMINOPHEN 500 MG PO TABS
1000.0000 mg | ORAL_TABLET | Freq: Three times a day (TID) | ORAL | 0 refills | Status: DC
Start: 2020-01-22 — End: 2020-01-25

## 2020-01-22 MED ORDER — OXYCODONE HCL 5 MG PO TABS
5.0000 mg | ORAL_TABLET | ORAL | 0 refills | Status: DC | PRN
Start: 2020-01-22 — End: 2022-02-23

## 2020-01-22 NOTE — Progress Notes (Signed)
1 Day Post-Op  Subjective: Patient c/o post operative soreness as expected.  Has already eaten broccoli and cheese, mac and cheese, and [place] with gravy.  Tolerated well with no issues.  Voiding well, but hasn't really mobilized yet.  ROS: See above, otherwise other systems negative  Objective: Vital signs in last 24 hours: Temp:  [97.5 F (36.4 C)-98.9 F (37.2 C)] 97.8 F (36.6 C) (10/15 0540) Pulse Rate:  [50-76] 66 (10/15 0540) Resp:  [12-16] 16 (10/15 0540) BP: (98-146)/(56-86) 126/66 (10/15 0540) SpO2:  [91 %-100 %] 100 % (10/15 0540) Last BM Date: 01/21/20  Intake/Output from previous day: 10/14 0701 - 10/15 0700 In: 890 [P.O.:240; I.V.:550; IV Piggyback:100] Out: 370 [Urine:350; Blood:20] Intake/Output this shift: No intake/output data recorded.  PE: Abd: soft, appropriately tender, +BS, ND, incisions c/d/i  Lab Results:  Recent Labs    01/21/20 0445 01/22/20 0056  WBC 6.6 11.7*  HGB 8.6* 9.1*  HCT 28.6* 30.7*  PLT 532* 623*   BMET Recent Labs    01/21/20 0445 01/22/20 0056  NA 133* 133*  K 4.8 4.9  CL 98 94*  CO2 26 31  GLUCOSE 102* 155*  BUN 9 10  CREATININE 0.41* 0.59*  CALCIUM 8.4* 8.5*   PT/INR No results for input(s): LABPROT, INR in the last 72 hours. CMP     Component Value Date/Time   NA 133 (L) 01/22/2020 0056   K 4.9 01/22/2020 0056   CL 94 (L) 01/22/2020 0056   CO2 31 01/22/2020 0056   GLUCOSE 155 (H) 01/22/2020 0056   BUN 10 01/22/2020 0056   CREATININE 0.59 (L) 01/22/2020 0056   CALCIUM 8.5 (L) 01/22/2020 0056   PROT 6.6 01/22/2020 0056   ALBUMIN 2.0 (L) 01/22/2020 0056   AST 39 01/22/2020 0056   ALT 47 (H) 01/22/2020 0056   ALKPHOS 530 (H) 01/22/2020 0056   BILITOT 1.3 (H) 01/22/2020 0056   GFRNONAA >60 01/22/2020 0056   Lipase     Component Value Date/Time   LIPASE 21 01/15/2020 1336       Studies/Results: DG ERCP BILIARY & PANCREATIC DUCTS  Result Date: 01/21/2020 CLINICAL DATA:  ERCP.  Weight loss.  EXAM: ERCP TECHNIQUE: Multiple spot images obtained with the fluoroscopic device and submitted for interpretation post-procedure. FLUOROSCOPY TIME:  5 minutes, 2 seconds COMPARISON:  CT abdomen and pelvis-01/15/2020 FINDINGS: Multiple spot fluoroscopic images the right upper abdominal quadrant during ERCP are provided for review. Initial image demonstrates an ERCP probe overlying the right upper abdominal quadrant including pre-existing biliary stent which appears partially retracted and subsequently removed. Subsequent images demonstrate selective cannulation and opacification of the common bile duct which appears moderate to markedly dilated. Subsequent images demonstrate a biliary plasty balloon overlying expected location of ampulla. Subsequent images demonstrate utilization of a cage device for additional stone extraction. There is minimal opacification the central aspect of the intrahepatic biliary tree which appears moderately dilated. There is minimal opacification of the cystic duct without significant opacification of gallbladder. IMPRESSION: ERCP with biliary stent removal, biliary plasty and stone extraction as above. These images were submitted for radiologic interpretation only. Please see the procedural report for the amount of contrast and the fluoroscopy time utilized. Electronically Signed   By: Sandi Mariscal M.D.   On: 01/21/2020 11:37    Anti-infectives: Anti-infectives (From admission, onward)   Start     Dose/Rate Route Frequency Ordered Stop   01/21/20 1000  cefTRIAXone (ROCEPHIN) 2 g in sodium chloride 0.9 % 100  mL IVPB        2 g 200 mL/hr over 30 Minutes Intravenous On call to O.R. 01/21/20 0913 01/22/20 0831   01/20/20 2200  amoxicillin-clavulanate (AUGMENTIN) 875-125 MG per tablet 1 tablet        1 tablet Oral Every 12 hours 01/20/20 1452     01/17/20 1400  ceFEPIme (MAXIPIME) 2 g in sodium chloride 0.9 % 100 mL IVPB  Status:  Discontinued        2 g 200 mL/hr over 30 Minutes  Intravenous Every 8 hours 01/17/20 1201 01/20/20 1452   01/17/20 1400  metroNIDAZOLE (FLAGYL) IVPB 500 mg  Status:  Discontinued        500 mg 100 mL/hr over 60 Minutes Intravenous Every 8 hours 01/17/20 1257 01/20/20 1452   01/16/20 1400  ceFEPIme (MAXIPIME) 1 g in sodium chloride 0.9 % 100 mL IVPB  Status:  Discontinued        1 g 200 mL/hr over 30 Minutes Intravenous Every 8 hours 01/16/20 1251 01/17/20 1201       Assessment/Plan Aspiration pneumonitis - on abx since 10/9 O2 dependent COPD - 2 years  Tobacco use - on going < 1PPD currently Hx of heavy ETOH use, quit last year Hypertension Hx CHF - (Echo19/10/21: EF 55-60%, mild LA dil, Grade 1 diastolic                   dysfunction,mild AR) Hx ERCP with stent placement 2010 lost to follow up ERCP with occluded stent removal 01/20/2020,Gabriel Mansouraty, MD 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  Has multi-modal pain meds ordered -patient is surgically stable for DC home, but may medically need more time.  Will defer to medicine on timing of discharge. -follow up being arranged and pain medications have been written for  FEN: heart healthy diet ID: Maxipime 10/9-10/13; Flagyl 10/10-10/13; Augmentin 10/13>> day 2 DVT: SCDs, ok for prophylaxis from our standpoint Follow-up: [facility]   LOS: 7 days    Henreitta Cea , [facility] 01/22/2020, 8:34 AM Please see Amion for pager number during day hours 7:00am-4:30pm or 7:00am -11:30am on weekends

## 2020-01-22 NOTE — Progress Notes (Signed)
Physical Therapy Treatment Patient Details Name: Kenneth Coleman MRN: [id number] DOB: 04/12/1955 Today's Date: 01/22/2020    History of Present Illness 64 year old male with a history of COPD, chronic respiratory failure on 2.5 L, hypertension, coronary disease, CHF presenting with dysphagia, abdominal pain, weight loss, and generalized weakness. Patient underwent ERCP 10/13 and laparoscopic cholecytectomy 10/14     PT Comments    Patient progressing towards physical therapy goals. Patient limited this session by pain in abdomen at incision site. Session focused on LE strengthening and mobility. Patient continues to be limited by pain, generalized weakness, impaired dynamic balance, and decreased activity tolerance. Anticipate no PT follow up following discharge. PT will continue to follow.    Follow Up Recommendations  No PT follow up     Equipment Recommendations  None recommended by PT    Recommendations for Other Services       Precautions / Restrictions Precautions Precautions: None Restrictions Weight Bearing Restrictions: No    Mobility  Bed Mobility Overal bed mobility: Modified Independent                Transfers Overall transfer level: Modified independent Equipment used: None                Ambulation/Gait Ambulation/Gait assistance: Supervision Gait Distance (Feet): 350 Feet Assistive device: None Gait Pattern/deviations: Step-through pattern;Wide base of support   Gait velocity interpretation: 1.31 - 2.62 ft/sec, indicative of limited community ambulator General Gait Details: 3L O2 [place]; at rest spO2 100%, following activity spO2 100% (1-2 minute rest break due to equipment malfunction)   Stairs             Wheelchair Mobility    Modified Rankin (Stroke Patients Only)       Balance Overall balance assessment: Needs assistance Sitting-balance support: No upper extremity supported;Feet supported Sitting balance-Leahy Scale:  Good     Standing balance support: No upper extremity supported;During functional activity Standing balance-Leahy Scale: Fair Standing balance comment: Pt required min guard during standing activities for safety                            Cognition Arousal/Alertness: Awake/alert Behavior During Therapy: WFL for tasks assessed/performed Overall Cognitive Status: Within Functional Limits for tasks assessed                                        Exercises General Exercises - Lower Extremity Ankle Circles/Pumps: AROM;Both;10 reps;Supine Straight Leg Raises: AROM;Both;10 reps;Supine Hip Flexion/Marching: AROM;Both;10 reps;Supine    General Comments        Pertinent Vitals/Pain Pain Assessment: 0-10 Pain Score: 9  Pain Location: abdomen Pain Descriptors / Indicators: Constant;Grimacing;Discomfort;Guarding Pain Intervention(s): Limited activity within patient's tolerance;Monitored during session    Home Living                      Prior Function            PT Goals (current goals can now be found in the care plan section) Acute Rehab PT Goals Patient Stated Goal: to go home PT Goal Formulation: With patient Time For Goal Achievement: 02/01/20 Potential to Achieve Goals: Good Progress towards PT goals: Progressing toward goals    Frequency    Min 3X/week      PT Plan Current plan remains appropriate    Co-evaluation  AM-PAC PT "6 Clicks" Mobility   Outcome Measure  Help needed turning from your back to your side while in a flat bed without using bedrails?: None Help needed moving from lying on your back to sitting on the side of a flat bed without using bedrails?: None Help needed moving to and from a bed to a chair (including a wheelchair)?: None Help needed standing up from a chair using your arms (e.g., wheelchair or bedside chair)?: None Help needed to walk in hospital room?: A Little Help needed  climbing 3-5 steps with a railing? : A Little 6 Click Score: 22    End of Session Equipment Utilized During Treatment: Oxygen (3L O2 [place]) Activity Tolerance: Patient limited by pain Patient left: in bed;with call bell/phone within reach;with SCD's reapplied Nurse Communication: Mobility status PT Visit Diagnosis: Muscle weakness (generalized) (M62.81)     Time: 0071-2197 PT Time Calculation (min) (ACUTE ONLY): 24 min  Charges:  $Therapeutic Exercise: 8-22 mins $Therapeutic Activity: 8-22 mins                     Perrin Maltese, PT, DPT Acute Rehabilitation Services Pager [phone] Office [phone]    Kenneth Coleman 01/22/2020, 11:35 AM

## 2020-01-22 NOTE — Progress Notes (Signed)
PROGRESS NOTE    Kenneth Coleman  YIR:[id number] DOB: 05/19/1955 DOA: 01/15/2020 PCP: System, Provider Not In   Brief Narrative: Kenneth Coleman is a 64 y.o. male with a history of COPD, chronic respiratory failure on 2.5 L, hypertension, coronary disease, chronic diastolic CHF. Patient presented secondary to dysphagia, abdominal pain and weight loss and found to have evidence of choledocholithiasis. On imaging, there was also evidence of esophogeal thickening for which he underwent EGD with negative biopsies. He was transferred to [facility] for ERCP.   Assessment & Plan:   Principal Problem:   Choledocholithiasis Active Problems:   CHF (congestive heart failure) (HCC)   COPD (chronic obstructive pulmonary disease) (HCC)   Coronary artery disease   Hypertension   Hypokalemia   Acute on chronic respiratory failure with hypoxia (HCC)   Lobar pneumonia (HCC)   Bile obstruction   Esophageal dysphagia   Acute esophagitis   Choledocholithiasis Diagnosed on ultrasound. Patient with a CBD biliary stent, however, imaging concerning for recurrent biliary obstruction. Patient transferred to [facility] for ERCP. Alkaline phosphatase trending up with AST/ALT trending down. Bilirubin trended down slightly. ERCP performed on 10/13 with balloon sphincteroplasty and removal of occluded biliary stent and biliary stones/sludge. S/p lap cholecystectomy on 10/14 -GI recommendations: cholecystectomy; signed off 10/14 -General surgery recommendations: Okay to discharge  Dysphagia In relation to esophageal thickening. S/p EGD as mentioned below.  Esophageal thickening Patient underwent Upper GI endoscopy with biopsies obtained which were significant for reactive squamous mucosa. No strictures/mass noted on EGD.  Malnutrition Albumin of 1.9. -Dietitian recommendations: -Boost Breeze po TID, each supplement provides 250 kcal and 9 grams of protein -30 ml Prosource Plus BID, each  supplement provides 100 kcals and 15 grams protein -MVI with minerals daily  Acute on chronic respiratory failure with hypoxia Patient is on 2.5 L of oxygen via nasal cannula as an outpatient.  Patient required up to 4 L of oxygen via nasal cannula while inpatient with saturations down to 80%.  Appears to be secondary to possible aspiration pneumonitis versus pneumonia.  Patient is now weaned back to home 2 L of oxygen.  Left lower lobe pneumonia Likely related to aspiration.  Patient started on cefepime and Flagyl. MRSA pcr is positive. No leukocytosis or fever. 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 Non-bleeding.Treated.  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 AVMs reated with APC.  Chronic anemia Iron panel significant for low iron. Possible component of iron deficiency anemia. Patient does not have colon cancer screening performed. Anemia is currently stable. -Recommendations for colonoscopy as an outpatient with Dr. Jenetta Downer.  DVT prophylaxis: SCDs Code Status:   Code Status: Full Code Family Communication: None at bedside Disposition Plan: Discharge likely home in 24 hours pending continued stability post-op and in terms of pain and ALP   Consultants:   Gastroenterology  Procedures:   UPPER GI ENDOSCOPY (10/9)  ERCP (10/13)  Antimicrobials:  Cefepime  Flagyl   Augementin   Subjective: Abdominal soreness from surgery. Very uncomfortable.  Objective: Vitals:   01/21/20 2008 01/22/20 0152 01/22/20 0540 01/22/20 0844  BP: (!) 110/57 (!) 98/56 126/66 (!) 143/80  Pulse: (!) 50 61 66 71  Resp: 16 16 16 18   Temp: (!) 97.5 F (36.4 C) 97.8 F (36.6 C) 97.8 F (36.6 C) 98.8 F (37.1 C)  TempSrc:   Oral Oral  SpO2: 100% 100% 100% 99%  Weight:  Height:        Intake/Output Summary (Last 24 hours) at 01/22/2020 0923 Last data filed at  01/21/2020 2036 Gross per 24 hour  Intake 890 ml  Output 370 ml  Net 520 ml   Filed Weights   01/17/20 1100  Weight: 70.3 kg    Examination:  General exam: Appears calm and comfortable. Thin appearing. Respiratory system: Clear to auscultation. Respiratory effort normal. Cardiovascular system: S1 & S2 heard, RRR. No murmurs, rubs, gallops or clicks. Gastrointestinal system: Abdomen is nondistended, soft and tender in epigastric/RUQ. No organomegaly or masses felt. Normal bowel sounds heard. Central nervous system: Alert and oriented. No focal neurological deficits. Musculoskeletal: No edema. No calf tenderness Skin: No cyanosis. No rashes Psychiatry: Judgement and insight appear normal. Mood & affect appropriate.     Data Reviewed: I have personally reviewed following labs and imaging studies  CBC Lab Results  Component Value Date   WBC 11.7 (H) 01/22/2020   RBC 3.68 (L) 01/22/2020   HGB 9.1 (L) 01/22/2020   HCT 30.7 (L) 01/22/2020   MCV 83.4 01/22/2020   MCH 24.7 (L) 01/22/2020   PLT 623 (H) 01/22/2020   MCHC 29.6 (L) 01/22/2020   RDW 15.7 (H) 01/22/2020   LYMPHSABS 1.5 01/15/2020   MONOABS 0.9 01/15/2020   EOSABS 0.0 01/15/2020   BASOSABS 0.0 36/64/4034     Last metabolic panel Lab Results  Component Value Date   NA 133 (L) 01/22/2020   K 4.9 01/22/2020   CL 94 (L) 01/22/2020   CO2 31 01/22/2020   BUN 10 01/22/2020   CREATININE 0.59 (L) 01/22/2020   GLUCOSE 155 (H) 01/22/2020   GFRNONAA >60 01/22/2020   CALCIUM 8.5 (L) 01/22/2020   PROT 6.6 01/22/2020   ALBUMIN 2.0 (L) 01/22/2020   BILITOT 1.3 (H) 01/22/2020   ALKPHOS 530 (H) 01/22/2020   AST 39 01/22/2020   ALT 47 (H) 01/22/2020   ANIONGAP 8 01/22/2020    CBG (last 3)  No results for input(s): GLUCAP in the last 72 hours.   GFR: Estimated Creatinine Clearance: 91.5 mL/min (A) (by C-G formula based on SCr of 0.59 mg/dL (L)).  Coagulation Profile: Recent Labs  Lab 01/18/20 1154  INR 1.0     Recent Results (from the past 240 hour(s))  Resp Panel by RT PCR (RSV, Flu A&B, Covid) - Nasopharyngeal Swab     Status: None   Collection Time: 01/15/20  1:48 PM   Specimen: Nasopharyngeal Swab  Result Value Ref Range Status   SARS Coronavirus 2 by RT PCR NEGATIVE NEGATIVE Final    Comment: (NOTE) SARS-CoV-2 target nucleic acids are NOT DETECTED.  The SARS-CoV-2 RNA is generally detectable in upper respiratoy specimens during the acute phase of infection. The lowest concentration of SARS-CoV-2 viral copies this assay can detect is 131 copies/mL. A negative result does not preclude SARS-Cov-2 infection and should not be used as the sole basis for treatment or other patient management decisions. A negative result may occur with  improper specimen collection/handling, submission of specimen other than nasopharyngeal swab, presence of viral mutation(s) within the areas targeted by this assay, and inadequate number of viral copies (<131 copies/mL). A negative result must be combined with clinical observations, patient history, and epidemiological information. The expected result is Negative.  Fact Sheet for Patients:  PinkCheek.be  Fact Sheet for Healthcare Providers:  GravelBags.it  This test is no t yet approved or cleared by the [place] FDA and  has been authorized for detection  and/or diagnosis of SARS-CoV-2 by FDA under an Emergency Use Authorization (EUA). This EUA will remain  in effect (meaning this test can be used) for the duration of the COVID-19 declaration under Section 564(b)(1) of the Act, 21 U.S.C. section 360bbb-3(b)(1), unless the authorization is terminated or revoked sooner.     Influenza A by PCR NEGATIVE NEGATIVE Final   Influenza B by PCR NEGATIVE NEGATIVE Final    Comment: (NOTE) The Xpert Xpress SARS-CoV-2/FLU/RSV assay is intended as an aid in  the diagnosis of influenza from  Nasopharyngeal swab specimens and  should not be used as a sole basis for treatment. Nasal washings and  aspirates are unacceptable for Xpert Xpress SARS-CoV-2/FLU/RSV  testing.  Fact Sheet for Patients: PinkCheek.be  Fact Sheet for Healthcare Providers: GravelBags.it  This test is not yet approved or cleared by the [place] FDA and  has been authorized for detection and/or diagnosis of SARS-CoV-2 by  FDA under an Emergency Use Authorization (EUA). This EUA will remain  in effect (meaning this test can be used) for the duration of the  Covid-19 declaration under Section 564(b)(1) of the Act, 21  U.S.C. section 360bbb-3(b)(1), unless the authorization is  terminated or revoked.    Respiratory Syncytial Virus by PCR NEGATIVE NEGATIVE Final    Comment: (NOTE) Fact Sheet for Patients: PinkCheek.be  Fact Sheet for Healthcare Providers: GravelBags.it  This test is not yet approved or cleared by the [place] FDA and  has been authorized for detection and/or diagnosis of SARS-CoV-2 by  FDA under an Emergency Use Authorization (EUA). This EUA will remain  in effect (meaning this test can be used) for the duration of the  COVID-19 declaration under Section 564(b)(1) of the Act, 21 U.S.C.  section 360bbb-3(b)(1), unless the authorization is terminated or  revoked. Performed at [facility], [address] [place], [place] [address]   MRSA PCR Screening     Status: Abnormal   Collection Time: 01/17/20 12:58 PM   Specimen: Nasopharyngeal  Result Value Ref Range Status   MRSA by PCR POSITIVE (A) NEGATIVE Final    Comment:        The GeneXpert MRSA Assay (FDA approved for NASAL specimens only), is one component of a comprehensive MRSA colonization surveillance program. It is not intended to diagnose MRSA infection nor to guide or monitor treatment for MRSA  infections. RESULT CALLED TO, READ BACK BY AND VERIFIED WITH: L BULLINS AT 1723 ON 01/17/2020 BY MOSLEY,J Performed at [facility], [address], [place], [place] [address]         Radiology Studies: DG ERCP BILIARY & PANCREATIC DUCTS  Result Date: 01/21/2020 CLINICAL DATA:  ERCP.  Weight loss. EXAM: ERCP TECHNIQUE: Multiple spot images obtained with the fluoroscopic device and submitted for interpretation post-procedure. FLUOROSCOPY TIME:  5 minutes, 2 seconds COMPARISON:  CT abdomen and pelvis-01/15/2020 FINDINGS: Multiple spot fluoroscopic images the right upper abdominal quadrant during ERCP are provided for review. Initial image demonstrates an ERCP probe overlying the right upper abdominal quadrant including pre-existing biliary stent which appears partially retracted and subsequently removed. Subsequent images demonstrate selective cannulation and opacification of the common bile duct which appears moderate to markedly dilated. Subsequent images demonstrate a biliary plasty balloon overlying expected location of ampulla. Subsequent images demonstrate utilization of a cage device for additional stone extraction. There is minimal opacification the central aspect of the intrahepatic biliary tree which appears moderately dilated. There is minimal opacification of the cystic duct without significant opacification of  gallbladder. IMPRESSION: ERCP with biliary stent removal, biliary plasty and stone extraction as above. These images were submitted for radiologic interpretation only. Please see the procedural report for the amount of contrast and the fluoroscopy time utilized. Electronically Signed   By: Sandi Mariscal M.D.   On: 01/21/2020 11:37        Scheduled Meds: . (feeding supplement) PROSource Plus  30 mL Oral BID BM  . acetaminophen  1,000 mg Oral Q8H  . amoxicillin-clavulanate  1 tablet Oral Q12H  . budesonide (PULMICORT) nebulizer solution  0.5 mg Nebulization BID  . carvedilol   6.25 mg Oral BID  . Chlorhexidine Gluconate Cloth  6 each Topical Q0600  . digoxin  125 mcg Oral Daily  . docusate sodium  100 mg Oral Daily  . feeding supplement  1 Container Oral TID BM  . indomethacin  100 mg Rectal Once  . multivitamin with minerals  1 tablet Oral Daily  . mupirocin ointment  1 application Nasal BID  . pantoprazole  40 mg Oral BID AC  . spironolactone  25 mg Oral Daily   Continuous Infusions:    LOS: 7 days     Cordelia Poche, MD [facility] 01/22/2020, 9:23 AM  If 7PM-7AM, please contact night-coverage [email or web address]

## 2020-01-22 NOTE — Discharge Instructions (Signed)
[facility]  Please arrive at least 30 min before your appointment to complete your check in paperwork.  If you are unable to arrive 30 min prior to your appointment time we may have to cancel or reschedule you. LAPAROSCOPIC SURGERY: POST OP INSTRUCTIONS Always review your discharge instruction sheet given to you by the facility where your surgery was performed. IF YOU HAVE DISABILITY OR FAMILY LEAVE FORMS, YOU MUST BRING THEM TO THE OFFICE FOR PROCESSING.   DO NOT GIVE THEM TO YOUR DOCTOR.  PAIN CONTROL  1. First take acetaminophen (Tylenol) AND/or ibuprofen (Advil) to control your pain after surgery.  Follow directions on package.  Taking acetaminophen (Tylenol) and/or ibuprofen (Advil) regularly after surgery will help to control your pain and lower the amount of prescription pain medication you may need.  You should not take more than 4,000 mg (4 grams) of acetaminophen (Tylenol) in 24 hours.  You should not take ibuprofen (Advil), aleve, motrin, naprosyn or other NSAIDS if you have a history of stomach ulcers or chronic kidney disease.  2. A prescription for pain medication may be given to you upon discharge.  Take your pain medication as prescribed, if you still have uncontrolled pain after taking acetaminophen (Tylenol) or ibuprofen (Advil). 3. Use ice packs to help control pain. 4. If you need a refill on your pain medication, please contact your pharmacy.  They will contact our office to request authorization. Prescriptions will not be filled after 5pm or on week-ends.  HOME MEDICATIONS 5. Take your usually prescribed medications unless otherwise directed.  DIET 6. You should follow a light diet the first few days after arrival home.  Be sure to include lots of fluids daily. Avoid fatty, fried foods.   CONSTIPATION 7. It is common to experience some constipation after surgery and if you are taking pain medication.  Increasing fluid intake and taking a stool  softener (such as Colace) will usually help or prevent this problem from occurring.  A mild laxative (Milk of Magnesia or Miralax) should be taken according to package instructions if there are no bowel movements after 48 hours.  WOUND/INCISION CARE 8. Most patients will experience some swelling and bruising in the area of the incisions.  Ice packs will help.  Swelling and bruising can take several days to resolve.  9. Unless discharge instructions indicate otherwise, follow guidelines below  a. STERI-STRIPS - you may remove your outer bandages 48 hours after surgery, and you may shower at that time.  You have steri-strips (small skin tapes) in place directly over the incision.  These strips should be left on the skin for 7-10 days.   b. DERMABOND/SKIN GLUE - you may shower in 24 hours.  The glue will flake off over the next 2-3 weeks. 10. Any sutures or staples will be removed at the office during your follow-up visit.  ACTIVITIES 11. You may resume regular (light) daily activities beginning the next day--such as daily self-care, walking, climbing stairs--gradually increasing activities as tolerated.  You may have sexual intercourse when it is comfortable.  Refrain from any heavy lifting or straining until approved by your doctor. a. You may drive when you are no longer taking prescription pain medication, you can comfortably wear a seatbelt, and you can safely maneuver your car and apply brakes.  FOLLOW-UP 12. You should see your doctor in the office for a follow-up appointment approximately 2-3 weeks after your surgery.  You should have been given your post-op/follow-up appointment when   your surgery was scheduled.  If you did not receive a post-op/follow-up appointment, make sure that you call for this appointment within a day or two after you arrive home to insure a convenient appointment time.   WHEN TO CALL YOUR DOCTOR: 1. Fever over 101.0 2. Inability to urinate 3. Continued bleeding from  incision. 4. Increased pain, redness, or drainage from the incision. 5. Increasing abdominal pain  The clinic staff is available to answer your questions during regular business hours.  Please don't hesitate to call and ask to speak to one of the nurses for clinical concerns.  If you have a medical emergency, go to the nearest emergency room or call 911.  A surgeon from [facility] is always on call at the hospital. [address], [address], [place], [place]  [address] ? [address] [address], [place], [place]   [address] [phone] ? [phone] ? FAX [phone]  .........   Managing Your Pain After Surgery Without Opioids    Thank you for participating in our program to help patients manage their pain after surgery without opioids. This is part of our effort to provide you with the best care possible, without exposing you or your family to the risk that opioids pose.  What pain can I expect after surgery? You can expect to have some pain after surgery. This is normal. The pain is typically worse the day after surgery, and quickly begins to get better. Many studies have found that many patients are able to manage their pain after surgery with Over-the-Counter (OTC) medications such as Tylenol and Motrin. If you have a condition that does not allow you to take Tylenol or Motrin, notify your surgical team.  How will I manage my pain? The best strategy for controlling your pain after surgery is around the clock pain control with Tylenol (acetaminophen) and Motrin (ibuprofen or Advil). Alternating these medications with each other allows you to maximize your pain control. In addition to Tylenol and Motrin, you can use heating pads or ice packs on your incisions to help reduce your pain.  How will I alternate your regular strength over-the-counter pain medication? You will take a dose of pain medication every three hours. ; Start by taking 650 mg of Tylenol (2 pills of 325  mg) ; 3 hours later take 600 mg of Motrin (3 pills of 200 mg) ; 3 hours after taking the Motrin take 650 mg of Tylenol ; 3 hours after that take 600 mg of Motrin.   - 1 -  See example - if your first dose of Tylenol is at 12:00 PM   12:00 PM Tylenol 650 mg (2 pills of 325 mg)  3:00 PM Motrin 600 mg (3 pills of 200 mg)  6:00 PM Tylenol 650 mg (2 pills of 325 mg)  9:00 PM Motrin 600 mg (3 pills of 200 mg)  Continue alternating every 3 hours   We recommend that you follow this schedule around-the-clock for at least 3 days after surgery, or until you feel that it is no longer needed. Use the table on the last page of this handout to keep track of the medications you are taking. Important: Do not take more than 3000mg of Tylenol or 3200mg of Motrin in a 24-hour period. Do not take ibuprofen/Motrin if you have a history of bleeding stomach ulcers, severe kidney disease, &/or actively taking a blood thinner  What if I still have pain? If you have pain that is not   controlled with the over-the-counter pain medications (Tylenol and Motrin or Advil) you might have what we call "breakthrough" pain. You will receive a prescription for a small amount of an opioid pain medication such as Oxycodone, Tramadol, or Tylenol with Codeine. Use these opioid pills in the first 24 hours after surgery if you have breakthrough pain. Do not take more than 1 pill every 4-6 hours.  If you still have uncontrolled pain after using all opioid pills, don't hesitate to call our staff using the number provided. We will help make sure you are managing your pain in the best way possible, and if necessary, we can provide a prescription for additional pain medication.   Day 1    Time  Name of Medication Number of pills taken  Amount of Acetaminophen  Pain Level   Comments  AM PM       AM PM       AM PM       AM PM       AM PM       AM PM       AM PM       AM PM       Total Daily amount of Acetaminophen Do not  take more than  3,000 mg per day      Day 2    Time  Name of Medication Number of pills taken  Amount of Acetaminophen  Pain Level   Comments  AM PM       AM PM       AM PM       AM PM       AM PM       AM PM       AM PM       AM PM       Total Daily amount of Acetaminophen Do not take more than  3,000 mg per day      Day 3    Time  Name of Medication Number of pills taken  Amount of Acetaminophen  Pain Level   Comments  AM PM       AM PM       AM PM       AM PM          AM PM       AM PM       AM PM       AM PM       Total Daily amount of Acetaminophen Do not take more than  3,000 mg per day      Day 4    Time  Name of Medication Number of pills taken  Amount of Acetaminophen  Pain Level   Comments  AM PM       AM PM       AM PM       AM PM       AM PM       AM PM       AM PM       AM PM       Total Daily amount of Acetaminophen Do not take more than  3,000 mg per day      Day 5    Time  Name of Medication Number of pills taken  Amount of Acetaminophen  Pain Level   Comments  AM PM       AM PM       AM   PM       AM PM       AM PM       AM PM       AM PM       AM PM       Total Daily amount of Acetaminophen Do not take more than  3,000 mg per day       Day 6    Time  Name of Medication Number of pills taken  Amount of Acetaminophen  Pain Level  Comments  AM PM       AM PM       AM PM       AM PM       AM PM       AM PM       AM PM       AM PM       Total Daily amount of Acetaminophen Do not take more than  3,000 mg per day      Day 7    Time  Name of Medication Number of pills taken  Amount of Acetaminophen  Pain Level   Comments  AM PM       AM PM       AM PM       AM PM       AM PM       AM PM       AM PM       AM PM       Total Daily amount of Acetaminophen Do not take more than  3,000 mg per day        For additional information about how and where to safely dispose of unused  opioid medications - https://www.morepowerfulnc.org  Disclaimer: This document contains information and/or instructional materials adapted from [facility] for the typical patient with your condition. It does not replace medical advice from your health care provider because your experience may differ from that of the typical patient. Talk to your health care provider if you have any questions about this document, your condition or your treatment plan. Adapted from [facility]   

## 2020-01-22 NOTE — Progress Notes (Addendum)
Nutrition Follow-up  DOCUMENTATION CODES:   Severe malnutrition in context of chronic illness  INTERVENTION:   -Continue MVI with minerals daily -Continue Magic cup TID with meals, each supplement provides 290 kcal and 9 grams of protein -Ensure Enlive po TID, each supplement provides 350 kcal and 20 grams of protein -D/c Prosource Plus -D/c Boost Breeze -Downgrade diet to dysphagia 3 (advanced mechanical soft) for ease of intake   NUTRITION DIAGNOSIS:   Severe Malnutrition related to chronic illness (COPD) as evidenced by energy intake < or equal to 75% for > or equal to 1 month, moderate fat depletion, severe fat depletion, moderate muscle depletion, severe muscle depletion.  Ongoing  GOAL:   Patient will meet greater than or equal to 90% of their needs  Progressing   MONITOR:   PO intake, Supplement acceptance, Labs, Weight trends, Skin, I & O's  REASON FOR ASSESSMENT:   Consult Assessment of nutrition requirement/status  ASSESSMENT:   64 year old male with a history of COPD, chronic respiratory failure on 2.5 L, hypertension, coronary disease, CHF presenting with dysphagia, abdominal pain, weight loss, and generalized weakness.  10/13- s/p ERCP- revealed LA grade A and grade B esophagitis with no bleeding; blood in gastric antrum and in gastric body due to evidence of AVMs; open prior biliary sphincterotomy; visibly occluded biliary stent removed 10/14- s/p lap chole 10/15- advanced to heart healthy diet  Reviewed I/O's: +530 ml x 24 hours and +4.4 L since admission  UOP: 350 ml x 24 hours  Spoke with pt at bedside, who was pleasant and in good spirits at time o visit. He reports he thinks his appetite has improved as a result of his surgery. PTA pt reports poor appetite secondary to dysphagia; he is still struggling with this, reporting that mostly tougher food items cause pain in his throat (and causes increased energy expenditure due to prolonged mastication).  Pt amenable to diet downgrade for ease of intake. He enjoys the Ensure supplements and would like to continue them (was also consuming at home PTA).   Discussed importance of continued to good meal and supplement intake to promote healing.   Per MD notes, likely d/c home tomorrow.   Labs reviewed: Na: 133.   NUTRITION - FOCUSED PHYSICAL EXAM:    Most Recent Value  Orbital Region Moderate depletion  Upper Arm Region Severe depletion  Thoracic and Lumbar Region Severe depletion  Buccal Region Moderate depletion  Temple Region Severe depletion  Clavicle Bone Region Severe depletion  Clavicle and Acromion Bone Region Severe depletion  Scapular Bone Region Severe depletion  Dorsal Hand Severe depletion  Patellar Region Severe depletion  Anterior Thigh Region Severe depletion  Posterior Calf Region Severe depletion  Edema (RD Assessment) None  Hair Reviewed  Eyes Reviewed  Mouth Reviewed  Skin Reviewed  Nails Reviewed      Diet Order:   Diet Order            DIET DYS 3 Room service appropriate? Yes with Assist; Fluid consistency: Thin  Diet effective now                 EDUCATION NEEDS:   Education needs have been addressed  Skin:  Skin Assessment: Reviewed RN Assessment  Last BM:  01/21/20  Height:   Ht Readings from Last 1 Encounters:  01/17/20 5\' 9"  (1.753 m)    Weight:   Wt Readings from Last 1 Encounters:  01/17/20 70.3 kg   BMI:  Body mass index is 22.89  kg/m.  Estimated Nutritional Needs:   Kcal:  2100-2300  Protein:  105-120 grams  Fluid:  > 2 L    Loistine Chance, RD, LDN, [place] Registered Dietitian II Certified Diabetes Care and Education Specialist Please refer to [facility] for RD and/or RD on-call/weekend/after hours pager

## 2020-01-23 ENCOUNTER — Inpatient Hospital Stay (HOSPITAL_COMMUNITY): Payer: Medicare Other

## 2020-01-23 DIAGNOSIS — J9621 Acute and chronic respiratory failure with hypoxia: Secondary | ICD-10-CM | POA: Diagnosis not present

## 2020-01-23 DIAGNOSIS — K831 Obstruction of bile duct: Secondary | ICD-10-CM | POA: Diagnosis not present

## 2020-01-23 DIAGNOSIS — K8050 Calculus of bile duct without cholangitis or cholecystitis without obstruction: Secondary | ICD-10-CM | POA: Diagnosis not present

## 2020-01-23 DIAGNOSIS — J181 Lobar pneumonia, unspecified organism: Secondary | ICD-10-CM | POA: Diagnosis not present

## 2020-01-23 DIAGNOSIS — E43 Unspecified severe protein-calorie malnutrition: Secondary | ICD-10-CM | POA: Insufficient documentation

## 2020-01-23 LAB — CBC
HCT: 26.9 % — ABNORMAL LOW (ref 39.0–52.0)
Hemoglobin: 7.9 g/dL — ABNORMAL LOW (ref 13.0–17.0)
MCH: 24.5 pg — ABNORMAL LOW (ref 26.0–34.0)
MCHC: 29.4 g/dL — ABNORMAL LOW (ref 30.0–36.0)
MCV: 83.3 fL (ref 80.0–100.0)
Platelets: 484 10*3/uL — ABNORMAL HIGH (ref 150–400)
RBC: 3.23 MIL/uL — ABNORMAL LOW (ref 4.22–5.81)
RDW: 15.8 % — ABNORMAL HIGH (ref 11.5–15.5)
WBC: 9.5 10*3/uL (ref 4.0–10.5)
nRBC: 0 % (ref 0.0–0.2)

## 2020-01-23 LAB — COMPREHENSIVE METABOLIC PANEL
ALT: 38 U/L (ref 0–44)
AST: 37 U/L (ref 15–41)
Albumin: 1.9 g/dL — ABNORMAL LOW (ref 3.5–5.0)
Alkaline Phosphatase: 499 U/L — ABNORMAL HIGH (ref 38–126)
Anion gap: 5 (ref 5–15)
BUN: 7 mg/dL — ABNORMAL LOW (ref 8–23)
CO2: 33 mmol/L — ABNORMAL HIGH (ref 22–32)
Calcium: 8.2 mg/dL — ABNORMAL LOW (ref 8.9–10.3)
Chloride: 100 mmol/L (ref 98–111)
Creatinine, Ser: 0.47 mg/dL — ABNORMAL LOW (ref 0.61–1.24)
GFR, Estimated: >60 mL/min (ref >60)
Glucose, Bld: 115 mg/dL — ABNORMAL HIGH (ref 70–99)
Potassium: 4.2 mmol/L (ref 3.5–5.1)
Sodium: 138 mmol/L (ref 135–145)
Total Bilirubin: 1 mg/dL (ref 0.3–1.2)
Total Protein: 5.8 g/dL — ABNORMAL LOW (ref 6.5–8.1)

## 2020-01-23 NOTE — Evaluation (Signed)
Clinical/Bedside Swallow Evaluation Patient Details  Name: Kenneth Coleman MRN: [id number] Date of Birth: 1955-05-06  Today's Date: 01/23/2020 Time: SLP Start Time (ACUTE ONLY): 7341 SLP Stop Time (ACUTE ONLY): 0835 SLP Time Calculation (min) (ACUTE ONLY): 16 min  Past Medical History:  Past Medical History:  Diagnosis Date  . CHF (congestive heart failure) ([place])   . COPD (chronic obstructive pulmonary disease) ([place])   . Coronary artery disease   . Hypertension    Past Surgical History:  Past Surgical History:  Procedure Laterality Date  . BIOPSY  01/16/2020   Procedure: BIOPSY;  Surgeon: Montez Morita, Quillian Quince, MD;  [facility]: [facility];  Service: Gastroenterology;;  esophageal biopsies @25 ,37,20  . CHOLECYSTECTOMY Bilateral 01/21/2020   Procedure: LAPAROSCOPIC CHOLECYSTECTOMY;  Surgeon: Greer Pickerel, MD;  Location: [facility];  Service: General;  Laterality: Bilateral;  . ESOPHAGOGASTRODUODENOSCOPY (EGD) WITH PROPOFOL N/A 01/16/2020   Procedure: ESOPHAGOGASTRODUODENOSCOPY (EGD) WITH PROPOFOL;  Surgeon: Harvel Quale, MD;  Location: [facility];  Service: Gastroenterology;  Laterality: N/A;   HPI:  Pt is a 64 year old male presenting with dysphagia, abdominal pain, weight loss, and generalized weakness. Patient underwent ERCP 10/13 and laparoscopic cholecytectomy 10/14. Pt endorses changes in speech and swallowing that began over the last few months. He had MBS several years ago at outside facility with no findings of dysphagia. PMH: COPD on 2.5 L, HTN, CAD, CHF    Assessment / Plan / Recommendation Clinical Impression  Pt presents with several months of changes in swallowing, but also his speech. He is noted to be hypernasal and he feels like he is not as easily understood. During cranial nerve exam he is noted to have asymmetrical anatomy with uvula deviating to the L and minimal elevation of his velum on his L. Pt says he is having nasal regurgitation at times,  particularly if he tilts his head down as he swallows (not observed during this exam). Regardless of consistencies presented, he has audible swallows and feels like he has residue in his mouth and throat. He can clear his mouth pretty well, but he has a delayed cough that is productive of small particles of food. He believes he has mild improvement when he tilts his head slight to the L during swallowing, and I do notice that I don't hear his swallowing as loudly and he does not seem to cough as much.   Recommend using these precautions with current diet for now until MBS can be completed to better evaluate oropharyngeal swallowing. Dr. Hassell Done made aware of cranial nerve findings and changes in speech; paged Dr. Lonny Prude as well and await reply. MD may want to consider neurological referral given the above. More details about oropharyngeal function can be provided after MBS.   SLP Visit Diagnosis: Dysphagia, unspecified (R13.10)    Aspiration Risk  Mild aspiration risk;Moderate aspiration risk;Risk for inadequate nutrition/hydration    Diet Recommendation Dysphagia 3 (Mech soft);Thin liquid   Liquid Administration via: Cup;Straw Medication Administration: Whole meds with liquid Supervision: Patient able to self feed;Intermittent supervision to cue for compensatory strategies Compensations: Slow rate;Small sips/bites Postural Changes: Seated upright at 90 degrees;Remain upright for at least 30 minutes after po intake    Other  Recommendations Recommended Consults: Other (Comment) (consider neuro) Oral Care Recommendations: Oral care BID   Follow up Recommendations  (tba)      Frequency and Duration            Prognosis Prognosis for Safe Diet Advancement: Good  Swallow Study   General HPI: Pt is a 64 year old male presenting with dysphagia, abdominal pain, weight loss, and generalized weakness. Patient underwent ERCP 10/13 and laparoscopic cholecytectomy 10/14. Pt endorses changes  in speech and swallowing that began over the last few months. He had MBS several years ago at outside facility with no findings of dysphagia. PMH: COPD on 2.5 L, HTN, CAD, CHF  Type of Study: Bedside Swallow Evaluation Previous Swallow Assessment: see HPI Diet Prior to this Study: Dysphagia 3 (soft);Thin liquids Temperature Spikes Noted: No Respiratory Status: Nasal cannula History of Recent Intubation: No Behavior/Cognition: Alert;Cooperative;Pleasant mood Oral Cavity Assessment: Within Functional Limits Oral Care Completed by SLP: No Oral Cavity - Dentition: Adequate natural dentition;Missing dentition Vision: Functional for self-feeding Self-Feeding Abilities: Able to feed self Patient Positioning: Other (comment) (EOB) Baseline Vocal Quality: Other (comment) (hypernasal speech) Volitional Cough: Strong Volitional Swallow: Able to elicit    Oral/Motor/Sensory Function Overall Oral Motor/Sensory Function: Moderate impairment Facial ROM: Within Functional Limits Facial Symmetry: Within Functional Limits Facial Strength: Within Functional Limits Lingual ROM: Within Functional Limits Lingual Symmetry: Within Functional Limits Lingual Strength: Within Functional Limits Velum: Impaired left;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 [place] Pager [phone] Office [phone]  01/23/2020,8:44 AM

## 2020-01-23 NOTE — Progress Notes (Signed)
Patient ID: Kenneth Coleman, male   DOB: [birth year], 64 y.o.   MRN: [id number] [facility] Surgery Progress Note:   2 Days Post-Op  Subjective: Mental status is reasonably clear.  Complaints none. Objective: Vital signs in last 24 hours: Temp:  [97.6 F (36.4 C)-99.3 F (37.4 C)] 97.6 F (36.4 C) (10/16 0424) Pulse Rate:  [51-75] 75 (10/16 0742) Resp:  [15-18] 18 (10/16 0742) BP: (125-158)/(53-74) 125/53 (10/16 0424) SpO2:  [100 %] 100 % (10/16 0742)  Intake/Output from previous day: 10/15 0701 - 10/16 0700 In: 250 [P.O.:250] Out: 1200 [Urine:1200] Intake/Output this shift: Total I/O In: -  Out: 600 [Urine:600]  Physical Exam: Work of breathing is not labored.  He is working with speech therapy and they are examining for underlying dysphagia issues that need to be addressed before discharge.    Lab Results:  Results for orders placed or performed during the hospital encounter of 01/15/20 (from the past 48 hour(s))  Surgical pathology     Status: None   Collection Time: 01/21/20  1:04 PM  Result Value Ref Range   SURGICAL PATHOLOGY      SURGICAL PATHOLOGY CASE: [id number] PATIENT: Kenneth Coleman Surgical Pathology Report     Clinical History: cholecystitis (cm)     FINAL MICROSCOPIC DIAGNOSIS:  A. GALLBLADDER, CHOLECYSTECTOMY: - Chronic cholecystitis    GROSS DESCRIPTION:  Size/?Intact: An intact gallbladder measuring 11.5 x 4.4 x 1.5 cm Serosal surface: Tan-pink, smooth, hyperemic Mucosa/Wall: Mucosa is tan-pink and hyperemic, the wall measures up to 0.4 cm in thickness Contents: Gallbladder contains a moderate amount of tan-yellow bile, no calculi are identified in the gallbladder or specimen container Cystic duct: 0.7 cm in diameter Block Summary: 1 block submitted  Craig Staggers 01/21/2020)    Final Diagnosis performed by Jaquita Folds, MD.   Electronically signed 01/22/2020 Technical component performed at [employer]. [facility], [place]  [address], [place], [place] [address].  Professional component performed at [facility], [place] [address], [place], [place] [address].  Immunohistochemistry Technical component (if applicable) was performed at [facility]. [address], [place], [place], [place] [address].   IMMUNOHISTOCHEMISTRY DISCLAIMER (if applicable): Some of these immunohistochemical stains may have been developed and the performance characteristics determine by [facility]. Some may not have been cleared or approved by the U.S. Food and Drug Administration. The FDA has determined that such clearance or approval is not necessary. This test is used for clinical purposes. It should not be regarded as investigational or for research. This laboratory is certified under the [place] (CLIA-88) as qualified to perform high complexity clinical laboratory testing.  The controls stained appropriately.   Digoxin level     Status: Abnormal   Collection Time: 01/21/20  4:57 PM  Result Value Ref Range   Digoxin Level 0.2 (L) 1.0 - 2.0 ng/mL    Comment: Performed at [facility], [place] [address] [place], [place] [address]  CBC     Status: Abnormal   Collection Time: 01/22/20 12:56 AM  Result Value Ref Range   WBC 11.7 (H) 4.0 - 10.5 K/uL   RBC 3.68 (L) 4.22 - 5.81 MIL/uL   Hemoglobin 9.1 (L) 13.0 - 17.0 g/dL   HCT 30.7 (L) 39 - 52 %   MCV 83.4 80.0 - 100.0 fL   MCH 24.7 (L) 26.0 - 34.0 pg   MCHC 29.6 (L) 30.0 - 36.0 g/dL   RDW 15.7 (H) 11.5 -  15.5 %   Platelets 623 (H) 150 - 400 K/uL   nRBC 0.0 0.0 - 0.2 %    Comment: Performed at [facility], [place] [address] [place], [place] [address]  Comprehensive metabolic panel     Status: Abnormal   Collection Time: 01/22/20 12:56 AM  Result Value Ref Range   Sodium 133 (L) 135 - 145 mmol/L   Potassium 4.9 3.5 - 5.1 mmol/L   Chloride 94 (L) 98 - 111 mmol/L   CO2 31  22 - 32 mmol/L   Glucose, Bld 155 (H) 70 - 99 mg/dL    Comment: Glucose reference range applies only to samples taken after fasting for at least 8 hours.   BUN 10 8 - 23 mg/dL   Creatinine, Ser 0.59 (L) 0.61 - 1.24 mg/dL   Calcium 8.5 (L) 8.9 - 10.3 mg/dL   Total Protein 6.6 6.5 - 8.1 g/dL   Albumin 2.0 (L) 3.5 - 5.0 g/dL   AST 39 15 - 41 U/L   ALT 47 (H) 0 - 44 U/L   Alkaline Phosphatase 530 (H) 38 - 126 U/L   Total Bilirubin 1.3 (H) 0.3 - 1.2 mg/dL   GFR, Estimated >60 >60 mL/min   Anion gap 8 5 - 15    Comment: Performed at [place] [address], [place], [place] [address]  Comprehensive metabolic panel     Status: Abnormal   Collection Time: 01/23/20  1:04 AM  Result Value Ref Range   Sodium 138 135 - 145 mmol/L   Potassium 4.2 3.5 - 5.1 mmol/L   Chloride 100 98 - 111 mmol/L   CO2 33 (H) 22 - 32 mmol/L   Glucose, Bld 115 (H) 70 - 99 mg/dL    Comment: Glucose reference range applies only to samples taken after fasting for at least 8 hours.   BUN 7 (L) 8 - 23 mg/dL   Creatinine, Ser 0.47 (L) 0.61 - 1.24 mg/dL   Calcium 8.2 (L) 8.9 - 10.3 mg/dL   Total Protein 5.8 (L) 6.5 - 8.1 g/dL   Albumin 1.9 (L) 3.5 - 5.0 g/dL   AST 37 15 - 41 U/L   ALT 38 0 - 44 U/L   Alkaline Phosphatase 499 (H) 38 - 126 U/L   Total Bilirubin 1.0 0.3 - 1.2 mg/dL   GFR, Estimated >60 >60 mL/min   Anion gap 5 5 - 15    Comment: Performed at [facility], [place] [address], [place], [place] [address]  CBC     Status: Abnormal   Collection Time: 01/23/20  1:04 AM  Result Value Ref Range   WBC 9.5 4.0 - 10.5 K/uL   RBC 3.23 (L) 4.22 - 5.81 MIL/uL   Hemoglobin 7.9 (L) 13.0 - 17.0 g/dL   HCT 26.9 (L) 39 - 52 %   MCV 83.3 80.0 - 100.0 fL   MCH 24.5 (L) 26.0 - 34.0 pg   MCHC 29.4 (L) 30.0 - 36.0 g/dL   RDW 15.8 (H) 11.5 - 15.5 %   Platelets 484 (H) 150 - 400 K/uL   nRBC 0.0 0.0 - 0.2 %    Comment: Performed at [place] [address] [place],  [address]     Radiology/Results: No results found.  Anti-infectives: Anti-infectives (From admission, onward)   Start     Dose/Rate Route Frequency Ordered Stop   01/21/20 1000  cefTRIAXone (ROCEPHIN) 2 g in sodium chloride 0.9 %  100 mL IVPB        2 g 200 mL/hr over 30 Minutes Intravenous On call to O.R. 01/21/20 0913 01/22/20 0831   01/20/20 2200  amoxicillin-clavulanate (AUGMENTIN) 875-125 MG per tablet 1 tablet        1 tablet Oral Every 12 hours 01/20/20 1452     01/17/20 1400  ceFEPIme (MAXIPIME) 2 g in sodium chloride 0.9 % 100 mL IVPB  Status:  Discontinued        2 g 200 mL/hr over 30 Minutes Intravenous Every 8 hours 01/17/20 1201 01/20/20 1452   01/17/20 1400  metroNIDAZOLE (FLAGYL) IVPB 500 mg  Status:  Discontinued        500 mg 100 mL/hr over 60 Minutes Intravenous Every 8 hours 01/17/20 1257 01/20/20 1452   01/16/20 1400  ceFEPIme (MAXIPIME) 1 g in sodium chloride 0.9 % 100 mL IVPB  Status:  Discontinued        1 g 200 mL/hr over 30 Minutes Intravenous Every 8 hours 01/16/20 1251 01/17/20 1201      Assessment/Plan: Problem List: Patient Active Problem List   Diagnosis Date Noted   Protein-calorie malnutrition, severe 01/23/2020   Acute esophagitis    Bile obstruction    Esophageal dysphagia    Lobar pneumonia ([place]) 01/17/2020   Acute on chronic respiratory failure with hypoxia ([place]) 01/16/2020   Choledocholithiasis 01/15/2020   Hypokalemia 01/15/2020   CHF (congestive heart failure) (HCC)    COPD (chronic obstructive pulmonary disease) ([place])    Coronary artery disease    Hypertension     Speech therapy consult in progress.  2 Days Post-Op    LOS: 8 days   Matt B. Hassell Done, MD, [facility] [phone] to reach the surgeon on call.    01/23/2020 9:15 AM

## 2020-01-23 NOTE — Progress Notes (Signed)
PROGRESS NOTE    Lenox Bink  LYY:[id number] DOB: 29-Apr-1955 DOA: 01/15/2020 PCP: System, Provider Not In   Brief Narrative: Kagan Mutchler is a 64 y.o. male with a history of COPD, chronic respiratory failure on 2.5 L, hypertension, coronary disease, chronic diastolic CHF. Patient presented secondary to dysphagia, abdominal pain and weight loss and found to have evidence of choledocholithiasis. On imaging, there was also evidence of esophogeal thickening for which he underwent EGD with negative biopsies. He was transferred to [facility] for ERCP.   Assessment & Plan:   Principal Problem:   Choledocholithiasis Active Problems:   CHF (congestive heart failure) (HCC)   COPD (chronic obstructive pulmonary disease) (HCC)   Coronary artery disease   Hypertension   Hypokalemia   Acute on chronic respiratory failure with hypoxia (HCC)   Lobar pneumonia (HCC)   Bile obstruction   Esophageal dysphagia   Acute esophagitis   Protein-calorie malnutrition, severe   Choledocholithiasis Diagnosed on ultrasound. Patient with a CBD biliary stent, however, imaging concerning for recurrent biliary obstruction. Patient transferred to [facility] for ERCP. Alkaline phosphatase trending up with AST/ALT trending down. Bilirubin trended down slightly. ERCP performed on 10/13 with balloon sphincteroplasty and removal of occluded biliary stent and biliary stones/sludge. S/p lap cholecystectomy on 10/14 -GI recommendations: cholecystectomy; signed off 10/14 -General surgery recommendations: Okay to discharge from surgical stand point  Dysphagia In relation to esophageal thickening. S/p EGD as mentioned below. Also concern for oropharyngeal component. Speech therapy consulted on 10/15. -SLP recommendations: MBS; possible neurologic etiology -MRI brain to rule in/out stroke  Esophageal thickening Patient underwent Upper GI endoscopy with biopsies obtained which were significant for  reactive squamous mucosa. No strictures/mass noted on EGD.  Malnutrition Albumin of 1.9. -Dietitian recommendations: -Boost Breeze po TID, each supplement provides 250 kcal and 9 grams of protein -30 ml Prosource Plus BID, each supplement provides 100 kcals and 15 grams protein -MVI with minerals daily  Acute on chronic respiratory failure with hypoxia Patient is on 2.5 L of oxygen via nasal cannula as an outpatient.  Patient required up to 4 L of oxygen via nasal cannula while inpatient with saturations down to 80%.  Appears to be secondary to possible aspiration pneumonitis versus pneumonia.  Patient is now weaned back to home 2 L of oxygen.  Left lower lobe pneumonia Likely related to aspiration.  Patient started on cefepime and Flagyl. MRSA pcr is positive. No leukocytosis or fever. Patient transitioned to Augmentin and has had 7 days of treatment. Discontinue antibiotics.  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 Non-bleeding.Treated.  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 AVMs reated with APC.  Chronic anemia Iron panel significant for low iron. Possible component of iron deficiency anemia. Patient does not have colon cancer screening performed. Anemia is currently stable. -Recommendations for colonoscopy as an outpatient with Dr. Jenetta Downer.  DVT prophylaxis: SCDs Code Status:   Code Status: Full Code Family Communication: None at bedside Disposition Plan: Discharge likely home in 1-3 days pending dysphagia workup since patient has had trouble tolerating medication administration to allow for safe discharge plan   Consultants:   Gastroenterology  General surgery  Procedures:   UPPER GI ENDOSCOPY (10/9)  ERCP (10/13)  Antimicrobials:  Cefepime  Flagyl   Augementin   Subjective: Abdomen is still sore but better than yesterday. No other concerns.  Objective: Vitals:    01/23/20 0424 01/23/20 1275 01/23/20 1700 01/23/20 1749  BP: (!) 125/53     Pulse: (!) 51 75 80 80  Resp: 15 18    Temp: 97.6 F (36.4 C)     TempSrc: Oral     SpO2: 100% 100%    Weight:      Height:        Intake/Output Summary (Last 24 hours) at 01/23/2020 1035 Last data filed at 01/23/2020 0808 Gross per 24 hour  Intake 250 ml  Output 1800 ml  Net -1550 ml   Filed Weights   01/17/20 1100  Weight: 70.3 kg    Examination:  General exam: Appears calm and comfortable. Thin appearing. Respiratory system: Clear to auscultation. Respiratory effort normal. Cardiovascular system: S1 & S2 heard, RRR. No murmurs, rubs, gallops or clicks. Gastrointestinal system: Abdomen is nondistended, soft and tender in epigastric/RUQ. No organomegaly or masses felt. Normal bowel sounds heard. Central nervous system: Alert and oriented. No focal neurological deficits. Musculoskeletal: No edema. No calf tenderness Skin: No cyanosis. No rashes Psychiatry: Judgement and insight appear normal. Mood & affect appropriate.     Data Reviewed: I have personally reviewed following labs and imaging studies  CBC Lab Results  Component Value Date   WBC 9.5 01/23/2020   RBC 3.23 (L) 01/23/2020   HGB 7.9 (L) 01/23/2020   HCT 26.9 (L) 01/23/2020   MCV 83.3 01/23/2020   MCH 24.5 (L) 01/23/2020   PLT 484 (H) 01/23/2020   MCHC 29.4 (L) 01/23/2020   RDW 15.8 (H) 01/23/2020   LYMPHSABS 1.5 01/15/2020   MONOABS 0.9 01/15/2020   EOSABS 0.0 01/15/2020   BASOSABS 0.0 02/58/5277     Last metabolic panel Lab Results  Component Value Date   NA 138 01/23/2020   K 4.2 01/23/2020   CL 100 01/23/2020   CO2 33 (H) 01/23/2020   BUN 7 (L) 01/23/2020   CREATININE 0.47 (L) 01/23/2020   GLUCOSE 115 (H) 01/23/2020   GFRNONAA >60 01/23/2020   CALCIUM 8.2 (L) 01/23/2020   PROT 5.8 (L) 01/23/2020   ALBUMIN 1.9 (L) 01/23/2020   BILITOT 1.0 01/23/2020   ALKPHOS 499 (H) 01/23/2020   AST 37 01/23/2020   ALT 38  01/23/2020   ANIONGAP 5 01/23/2020    CBG (last 3)  No results for input(s): GLUCAP in the last 72 hours.   GFR: Estimated Creatinine Clearance: 91.5 mL/min (A) (by C-G formula based on SCr of 0.47 mg/dL (L)).  Coagulation Profile: Recent Labs  Lab 01/18/20 1154  INR 1.0    Recent Results (from the past 240 hour(s))  Resp Panel by RT PCR (RSV, Flu A&B, Covid) - Nasopharyngeal Swab     Status: None   Collection Time: 01/15/20  1:48 PM   Specimen: Nasopharyngeal Swab  Result Value Ref Range Status   SARS Coronavirus 2 by RT PCR NEGATIVE NEGATIVE Final    Comment: (NOTE) SARS-CoV-2 target nucleic acids are NOT DETECTED.  The SARS-CoV-2 RNA is generally detectable in upper respiratoy specimens during the acute phase of infection. The lowest concentration of SARS-CoV-2 viral copies this assay can detect is 131 copies/mL. A negative result does not preclude SARS-Cov-2 infection and should not be used as the sole basis for treatment or other patient management decisions. A negative result may occur with  improper specimen collection/handling, submission of specimen other than nasopharyngeal swab, presence of viral mutation(s) within the areas targeted by this assay, and inadequate number of viral copies (<131 copies/mL). A negative result must be combined with clinical observations, patient history, and  epidemiological information. The expected result is Negative.  Fact Sheet for Patients:  PinkCheek.be  Fact Sheet for Healthcare Providers:  GravelBags.it  This test is no t yet approved or cleared by the [place] FDA and  has been authorized for detection and/or diagnosis of SARS-CoV-2 by FDA under an Emergency Use Authorization (EUA). This EUA will remain  in effect (meaning this test can be used) for the duration of the COVID-19 declaration under Section 564(b)(1) of the Act, 21 U.S.C. section 360bbb-3(b)(1),  unless the authorization is terminated or revoked sooner.     Influenza A by PCR NEGATIVE NEGATIVE Final   Influenza B by PCR NEGATIVE NEGATIVE Final    Comment: (NOTE) The Xpert Xpress SARS-CoV-2/FLU/RSV assay is intended as an aid in  the diagnosis of influenza from Nasopharyngeal swab specimens and  should not be used as a sole basis for treatment. Nasal washings and  aspirates are unacceptable for Xpert Xpress SARS-CoV-2/FLU/RSV  testing.  Fact Sheet for Patients: PinkCheek.be  Fact Sheet for Healthcare Providers: GravelBags.it  This test is not yet approved or cleared by the [place] FDA and  has been authorized for detection and/or diagnosis of SARS-CoV-2 by  FDA under an Emergency Use Authorization (EUA). This EUA will remain  in effect (meaning this test can be used) for the duration of the  Covid-19 declaration under Section 564(b)(1) of the Act, 21  U.S.C. section 360bbb-3(b)(1), unless the authorization is  terminated or revoked.    Respiratory Syncytial Virus by PCR NEGATIVE NEGATIVE Final    Comment: (NOTE) Fact Sheet for Patients: PinkCheek.be  Fact Sheet for Healthcare Providers: GravelBags.it  This test is not yet approved or cleared by the [place] FDA and  has been authorized for detection and/or diagnosis of SARS-CoV-2 by  FDA under an Emergency Use Authorization (EUA). This EUA will remain  in effect (meaning this test can be used) for the duration of the  COVID-19 declaration under Section 564(b)(1) of the Act, 21 U.S.C.  section 360bbb-3(b)(1), unless the authorization is terminated or  revoked. Performed at [facility], [address] [place], [place] [address]   MRSA PCR Screening     Status: Abnormal   Collection Time: 01/17/20 12:58 PM   Specimen: Nasopharyngeal  Result Value Ref Range Status   MRSA by PCR POSITIVE (A)  NEGATIVE Final    Comment:        The GeneXpert MRSA Assay (FDA approved for NASAL specimens only), is one component of a comprehensive MRSA colonization surveillance program. It is not intended to diagnose MRSA infection nor to guide or monitor treatment for MRSA infections. RESULT CALLED TO, READ BACK BY AND VERIFIED WITH: L BULLINS AT 1723 ON 01/17/2020 BY MOSLEY,J Performed at [facility], [address] [place], [place] [address]         Radiology Studies: No results found.      Scheduled Meds: . acetaminophen  1,000 mg Oral Q8H  . amoxicillin-clavulanate  1 tablet Oral Q12H  . budesonide (PULMICORT) nebulizer solution  0.5 mg Nebulization BID  . carvedilol  6.25 mg Oral BID  . digoxin  125 mcg Oral Daily  . docusate sodium  100 mg Oral Daily  . feeding supplement  237 mL Oral TID BM  . indomethacin  100 mg Rectal Once  . multivitamin with minerals  1 tablet Oral Daily  . pantoprazole  40 mg Oral BID AC  . spironolactone  25 mg Oral Daily   Continuous Infusions:  LOS: 8 days     Cordelia Poche, MD [facility] 01/23/2020, 10:35 AM  If 7PM-7AM, please contact night-coverage [email or web address]

## 2020-01-23 NOTE — Progress Notes (Addendum)
Modified Barium Swallow Progress Note  Patient Details  Name: Kenneth Coleman MRN: [id number] Date of Birth: May 13, 1955  Today's Date: 01/23/2020  Modified Barium Swallow completed.  Full report located under Chart Review in the Imaging Section.  Brief recommendations include the following:  Clinical Impression   Pt's oral phase of swallowing is relatively functional but with little to no ROM of swallowing structures for his pharyngeal swallow, still seeming most consistent with neurological etiology. He does not have anterior movement of his posterior pharyngeal wall or elevation of his velum (suspect L > R based on oral motor exam) so he cannot create a seal at his nasopharynx. Mild nasal regurgitation is noted with thin liquids that falls back down in the pharynx post-swallow. He also has almost no anterior burst of his hyoid, base of tongue retraction, pharyngeal squeeze, or epiglottic inversion. He also appears to have osteophytes that may be causing mildly reduced UES relaxation, but this is relatively adequate considering his pharyngeal swallow appears almost passive. Residue sits mostly in his valleculae, at times filling this whole space, but the pt will bring the residue back up into his mouth to perform repeated swallows. Residue is reduced but not eliminated by this strategy. Attempted various head positions (turns L/R, tilt, chin tuck) without any significant change in function. Pt manages to achieve adequate laryngeal vestibule closure despite the above, allowing him to protect his airway well during swallow. A few instances of penetration after the swallow on thin liquid residue was sensed quickly and ejected spontaneously with a throat clear. Recommend continuing Dys 3 diet and thin liquids as tolerated by pt. Could consider trial of swallowing therapy although prognosis is difficult to establish given uncertain etiology and time post-onset.    Swallow Evaluation Recommendations        SLP Diet Recommendations: Dysphagia 3 (Mech soft) solids;Thin liquid   Liquid Administration via: Cup;Straw   Medication Administration: Whole meds with liquid   Supervision: Patient able to self feed   Compensations: Slow rate;Small sips/bites;Follow solids with liquid;Clear throat intermittently   Postural Changes: Seated upright at 90 degrees;Remain semi-upright after after feeds/meals (Comment)   Oral Care Recommendations: Oral care BID        Osie Bond., M.A. [place] Pager [phone] Office [phone]  01/23/2020,12:57 PM

## 2020-01-24 ENCOUNTER — Encounter (HOSPITAL_COMMUNITY): Payer: Self-pay | Admitting: Gastroenterology

## 2020-01-24 DIAGNOSIS — J181 Lobar pneumonia, unspecified organism: Secondary | ICD-10-CM | POA: Diagnosis not present

## 2020-01-24 DIAGNOSIS — K8050 Calculus of bile duct without cholangitis or cholecystitis without obstruction: Secondary | ICD-10-CM | POA: Diagnosis not present

## 2020-01-24 DIAGNOSIS — K831 Obstruction of bile duct: Secondary | ICD-10-CM | POA: Diagnosis not present

## 2020-01-24 DIAGNOSIS — J9621 Acute and chronic respiratory failure with hypoxia: Secondary | ICD-10-CM | POA: Diagnosis not present

## 2020-01-24 LAB — CBC
HCT: 27.8 % — ABNORMAL LOW (ref 39.0–52.0)
Hemoglobin: 8.3 g/dL — ABNORMAL LOW (ref 13.0–17.0)
MCH: 25.8 pg — ABNORMAL LOW (ref 26.0–34.0)
MCHC: 29.9 g/dL — ABNORMAL LOW (ref 30.0–36.0)
MCV: 86.3 fL (ref 80.0–100.0)
Platelets: 501 10*3/uL — ABNORMAL HIGH (ref 150–400)
RBC: 3.22 MIL/uL — ABNORMAL LOW (ref 4.22–5.81)
RDW: 16 % — ABNORMAL HIGH (ref 11.5–15.5)
WBC: 8.6 10*3/uL (ref 4.0–10.5)
nRBC: 0 % (ref 0.0–0.2)

## 2020-01-24 MED ORDER — ASPIRIN 81 MG PO CHEW
81.0000 mg | CHEWABLE_TABLET | Freq: Every day | ORAL | Status: DC
  Administered 2020-01-24 – 2020-01-25 (×2): 81 mg via ORAL
  Filled 2020-01-24 (×2): qty 1

## 2020-01-24 NOTE — Progress Notes (Addendum)
  Speech Language Pathology Treatment: Dysphagia  Patient Details Name: Kenneth Coleman MRN: [id number] DOB: 09-17-1955 Today's Date: 01/24/2020 Time: 2778-2423 SLP Time Calculation (min) (ACUTE ONLY): 27 min  Assessment / Plan / Recommendation Clinical Impression  Pt was seen for dysphagia treatment and was cooperative during the session. He stated that he has been tolerating the current diet with reduced difficulty compared to prior to admission and stated that he just needs to take his time. Pt indicated that he recently ate and would rather not have anything during this session. He was re-educated regarding the results of the modified barium swallow study, the impact of his impairments, and swallowing precautions. He was also educated regarding the purpose and demonstration of dysphagia exercises. He verbalized understanding regarding all areas of education and stated that he remembered some of the exercises from when he had dysphagia treatment in the past. He completed effortful swallows with verbal prompts and independently demonstrated the [place]. He completed the isometric (45-60 sec each, 3x) and isokinetic (30x) portions of the Shaker with intermittent verbal and visual prompts for head positioning. Pt was educated regarding the recommendation for continued SLP services following discharge, He verbalized agreement with this recommendation, and stated that he would be able to have someone take him to treatment sessions.    HPI HPI: Pt is a 64 year old male presenting with dysphagia, abdominal pain, weight loss, and generalized weakness. Patient underwent ERCP 10/13 and laparoscopic cholecytectomy 10/14. Pt endorses changes in speech and swallowing that began over the last few months. He had MBS several years ago at outside facility with no findings of dysphagia. PMH: COPD on 2.5 L, HTN, CAD, CHF       SLP Plan  Continue with current plan of care       Recommendations  Diet  recommendations: Dysphagia 3 (mechanical soft);Thin liquid Liquids provided via: Cup;Straw Medication Administration: Whole meds with liquid Supervision: Patient able to self feed Compensations: Slow rate;Small sips/bites;Follow solids with liquid;Clear throat intermittently Postural Changes and/or Swallow Maneuvers: Seated upright 90 degrees;Upright 30-60 min after meal                Oral Care Recommendations: Oral care BID Follow up Recommendations: Outpatient SLP (TOC questions whether the pt may be a candidate for [facility]) SLP Visit Diagnosis: Dysphagia, pharyngeal phase (R13.13) Plan: Continue with current plan of care       Kenneth Coleman I. Hardin Negus, [place], [place] Office number [phone] Pager [phone]                 Kenneth Coleman 01/24/2020, 3:10 PM

## 2020-01-24 NOTE — Progress Notes (Signed)
PROGRESS NOTE    Erin Uecker  LTJ:[id number] DOB: [birth year] DOA: 01/15/2020 PCP: System, Provider Not In   Brief Narrative: Pharell Rolfson is a 64 y.o. male with a history of COPD, chronic respiratory failure on 2.5 L, hypertension, coronary disease, chronic diastolic CHF. Patient presented secondary to dysphagia, abdominal pain and weight loss and found to have evidence of choledocholithiasis. On imaging, there was also evidence of esophogeal thickening for which he underwent EGD with negative biopsies. He was transferred to [facility] for ERCP.   Assessment & Plan:   Principal Problem:   Choledocholithiasis Active Problems:   CHF (congestive heart failure) (HCC)   COPD (chronic obstructive pulmonary disease) (HCC)   Coronary artery disease   Hypertension   Hypokalemia   Acute on chronic respiratory failure with hypoxia (HCC)   Lobar pneumonia (HCC)   Bile obstruction   Esophageal dysphagia   Acute esophagitis   Protein-calorie malnutrition, severe   Choledocholithiasis Diagnosed on ultrasound. Patient with a CBD biliary stent, however, imaging concerning for recurrent biliary obstruction. Patient transferred to [facility] for ERCP. Alkaline phosphatase trending up with AST/ALT trending down. Bilirubin trended down slightly. ERCP performed on 10/13 with balloon sphincteroplasty and removal of occluded biliary stent and biliary stones/sludge. S/p lap cholecystectomy on 10/14 -GI recommendations: cholecystectomy; signed off 10/14 -General surgery recommendations: Okay to discharge from surgical stand point  Dysphagia In relation to esophageal thickening. S/p EGD as mentioned below. Also concern for oropharyngeal component. Speech therapy consulted on 10/15. -SLP recommendations: MBS; possible neurologic etiology  Remote right pons stroke Possible relating to above per discussion with neurology. Lacunar. Recommendations for aspirin 81 mg and outpatient  follow-up.  Esophageal thickening Patient underwent Upper GI endoscopy with biopsies obtained which were significant for reactive squamous mucosa. No strictures/mass noted on EGD.  Malnutrition Albumin of 1.9. -Dietitian recommendations: -Boost Breeze po TID, each supplement provides 250 kcal and 9 grams of protein -30 ml Prosource Plus BID, each supplement provides 100 kcals and 15 grams protein -MVI with minerals daily  Acute on chronic respiratory failure with hypoxia Patient is on 2.5 L of oxygen via nasal cannula as an outpatient.  Patient required up to 4 L of oxygen via nasal cannula while inpatient with saturations down to 80%.  Appears to be secondary to possible aspiration pneumonitis versus pneumonia.  Patient is now weaned back to home 2 L of oxygen.  Left lower lobe pneumonia Likely related to aspiration.  Patient started on cefepime and Flagyl. MRSA pcr is positive. No leukocytosis or fever. Patient transitioned to Augmentin and has had 7 days of treatment. Discontinue antibiotics.  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 Non-bleeding.Treated.  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 AVMs reated with APC.  Chronic anemia Iron panel significant for low iron. Possible component of iron deficiency anemia. Patient does not have colon cancer screening performed. Anemia is currently stable. -Recommendations for colonoscopy as an outpatient with Dr. Jenetta Downer.  DVT prophylaxis: SCDs Code Status:   Code Status: Full Code Family Communication: None at bedside Disposition Plan: Discharge likely home tomorrow pending ability to set up outpatient therapy follow-up.   Consultants:   Gastroenterology  General surgery  Procedures:   UPPER GI ENDOSCOPY (10/9)  ERCP (10/13)  Antimicrobials:  Cefepime  Flagyl   Augementin   Subjective: Still sore but otherwise no current  issues.  Objective: Vitals:   01/23/20 1930 01/24/20 0329 01/24/20 0824 01/24/20 1349  BP: (!) 102/55 (!) 100/57 (!) 105/54 (!) 105/52  Pulse: 61 62 68 66  Resp: 17 17 18 17   Temp: 97.8 F (36.6 C) 97.9 F (36.6 C) 97.8 F (36.6 C) 97.6 F (36.4 C)  TempSrc: Oral Oral Oral Oral  SpO2: 98% 98% 97% 100%  Weight:      Height:        Intake/Output Summary (Last 24 hours) at 01/24/2020 1433 Last data filed at 01/24/2020 0824 Gross per 24 hour  Intake --  Output 1 ml  Net -1 ml   Filed Weights   01/17/20 1100  Weight: 70.3 kg    Examination:  General exam: Appears calm and comfortable Respiratory system: Clear to auscultation. Respiratory effort normal. Cardiovascular system: S1 & S2 heard, RRR. No murmurs, rubs, gallops or clicks. Gastrointestinal system: Abdomen is nondistended, soft and mildly tender. No organomegaly or masses felt. Normal bowel sounds heard. Central nervous system: Alert and oriented Musculoskeletal: No edema. No calf tenderness Skin: No cyanosis. No rashes Psychiatry: Judgement and insight appear normal. Mood & affect appropriate.      Data Reviewed: I have personally reviewed following labs and imaging studies  CBC Lab Results  Component Value Date   WBC 8.6 01/24/2020   RBC 3.22 (L) 01/24/2020   HGB 8.3 (L) 01/24/2020   HCT 27.8 (L) 01/24/2020   MCV 86.3 01/24/2020   MCH 25.8 (L) 01/24/2020   PLT 501 (H) 01/24/2020   MCHC 29.9 (L) 01/24/2020   RDW 16.0 (H) 01/24/2020   LYMPHSABS 1.5 01/15/2020   MONOABS 0.9 01/15/2020   EOSABS 0.0 01/15/2020   BASOSABS 0.0 56/38/7564     Last metabolic panel Lab Results  Component Value Date   NA 138 01/23/2020   K 4.2 01/23/2020   CL 100 01/23/2020   CO2 33 (H) 01/23/2020   BUN 7 (L) 01/23/2020   CREATININE 0.47 (L) 01/23/2020   GLUCOSE 115 (H) 01/23/2020   GFRNONAA >60 01/23/2020   CALCIUM 8.2 (L) 01/23/2020   PROT 5.8 (L) 01/23/2020   ALBUMIN 1.9 (L) 01/23/2020   BILITOT 1.0  01/23/2020   ALKPHOS 499 (H) 01/23/2020   AST 37 01/23/2020   ALT 38 01/23/2020   ANIONGAP 5 01/23/2020    CBG (last 3)  No results for input(s): GLUCAP in the last 72 hours.   GFR: Estimated Creatinine Clearance: 91.5 mL/min (A) (by C-G formula based on SCr of 0.47 mg/dL (L)).  Coagulation Profile: Recent Labs  Lab 01/18/20 1154  INR 1.0    Recent Results (from the past 240 hour(s))  Resp Panel by RT PCR (RSV, Flu A&B, Covid) - Nasopharyngeal Swab     Status: None   Collection Time: 01/15/20  1:48 PM   Specimen: Nasopharyngeal Swab  Result Value Ref Range Status   SARS Coronavirus 2 by RT PCR NEGATIVE NEGATIVE Final    Comment: (NOTE) SARS-CoV-2 target nucleic acids are NOT DETECTED.  The SARS-CoV-2 RNA is generally detectable in upper respiratoy specimens during the acute phase of infection. The lowest concentration of SARS-CoV-2 viral copies this assay can detect is 131 copies/mL. A negative result does not preclude SARS-Cov-2 infection and should not be used as the sole basis for treatment or other patient management decisions. A negative result may occur with  improper specimen collection/handling, submission of specimen other than nasopharyngeal swab, presence of viral mutation(s) within the areas targeted by this assay, and inadequate number of viral copies (<131 copies/mL). A negative result must be combined with clinical  observations, patient history, and epidemiological information. The expected result is Negative.  Fact Sheet for Patients:  PinkCheek.be  Fact Sheet for Healthcare Providers:  GravelBags.it  This test is no t yet approved or cleared by the [place] FDA and  has been authorized for detection and/or diagnosis of SARS-CoV-2 by FDA under an Emergency Use Authorization (EUA). This EUA will remain  in effect (meaning this test can be used) for the duration of the COVID-19 declaration  under Section 564(b)(1) of the Act, 21 U.S.C. section 360bbb-3(b)(1), unless the authorization is terminated or revoked sooner.     Influenza A by PCR NEGATIVE NEGATIVE Final   Influenza B by PCR NEGATIVE NEGATIVE Final    Comment: (NOTE) The Xpert Xpress SARS-CoV-2/FLU/RSV assay is intended as an aid in  the diagnosis of influenza from Nasopharyngeal swab specimens and  should not be used as a sole basis for treatment. Nasal washings and  aspirates are unacceptable for Xpert Xpress SARS-CoV-2/FLU/RSV  testing.  Fact Sheet for Patients: PinkCheek.be  Fact Sheet for Healthcare Providers: GravelBags.it  This test is not yet approved or cleared by the [place] FDA and  has been authorized for detection and/or diagnosis of SARS-CoV-2 by  FDA under an Emergency Use Authorization (EUA). This EUA will remain  in effect (meaning this test can be used) for the duration of the  Covid-19 declaration under Section 564(b)(1) of the Act, 21  U.S.C. section 360bbb-3(b)(1), unless the authorization is  terminated or revoked.    Respiratory Syncytial Virus by PCR NEGATIVE NEGATIVE Final    Comment: (NOTE) Fact Sheet for Patients: PinkCheek.be  Fact Sheet for Healthcare Providers: GravelBags.it  This test is not yet approved or cleared by the [place] FDA and  has been authorized for detection and/or diagnosis of SARS-CoV-2 by  FDA under an Emergency Use Authorization (EUA). This EUA will remain  in effect (meaning this test can be used) for the duration of the  COVID-19 declaration under Section 564(b)(1) of the Act, 21 U.S.C.  section 360bbb-3(b)(1), unless the authorization is terminated or  revoked. Performed at [facility], [address], [place], [place] [address]   MRSA PCR Screening     Status: Abnormal   Collection Time: 01/17/20 12:58 PM   Specimen:  Nasopharyngeal  Result Value Ref Range Status   MRSA by PCR POSITIVE (A) NEGATIVE Final    Comment:        The GeneXpert MRSA Assay (FDA approved for NASAL specimens only), is one component of a comprehensive MRSA colonization surveillance program. It is not intended to diagnose MRSA infection nor to guide or monitor treatment for MRSA infections. RESULT CALLED TO, READ BACK BY AND VERIFIED WITH: L BULLINS AT 1723 ON 01/17/2020 BY MOSLEY,J Performed at [facility], [address], [place], [place] [address]         Radiology Studies: MR BRAIN WO CONTRAST  Result Date: 01/23/2020 CLINICAL DATA:  Neuro deficit, stroke suspected. EXAM: MRI HEAD WITHOUT CONTRAST TECHNIQUE: Multiplanar, multiecho pulse sequences of the brain and surrounding structures were obtained without intravenous contrast. COMPARISON:  None. FINDINGS: Brain: No diffusion-weighted signal abnormality. No intracranial hemorrhage. No midline shift, ventriculomegaly or extra-axial fluid collection. No mass lesion. Mild cerebral atrophy with ex vacuo dilatation. Moderate chronic microvascular ischemic changes. Tiny remote right pontine insult. Vascular: Normal flow voids. Skull and upper cervical spine: Normal marrow signal. Sinuses/Orbits: Normal orbits. Clear paranasal sinuses. Small left mastoid effusion. Other: None. IMPRESSION: No acute intracranial process.  Remote  right pontine lacunar insult. Mild cerebral atrophy. Moderate chronic microvascular ischemic changes. Electronically Signed   By: Primitivo Gauze M.D.   On: 01/23/2020 12:01   DG Swallowing Func-Speech Pathology  Result Date: 01/23/2020 Objective Swallowing Evaluation: Type of Study: MBS-Modified Barium Swallow Study  Patient Details Name: Avrum Kimball MRN: [id number] Date of Birth: 10/31/1955 Today's Date: 01/23/2020 Time: SLP Start Time (ACUTE ONLY): 3557 -SLP Stop Time (ACUTE ONLY): 1142 SLP Time Calculation (min) (ACUTE ONLY): 16 min Past Medical  History: Past Medical History: Diagnosis Date . CHF (congestive heart failure) ([facility])  . COPD (chronic obstructive pulmonary disease) ([place])  . Coronary artery disease  . Hypertension  Past Surgical History: Past Surgical History: Procedure Laterality Date . BIOPSY  01/16/2020  Procedure: BIOPSY;  Surgeon: Montez Morita, Quillian Quince, MD;  [facility];  Service: Gastroenterology;;  esophageal biopsies @25 ,37,20 . CHOLECYSTECTOMY Bilateral 01/21/2020  Procedure: LAPAROSCOPIC CHOLECYSTECTOMY;  Surgeon: Greer Pickerel, MD;  Location: [place];  Service: General;  Laterality: Bilateral; . ESOPHAGOGASTRODUODENOSCOPY (EGD) WITH PROPOFOL N/A 01/16/2020  Procedure: ESOPHAGOGASTRODUODENOSCOPY (EGD) WITH PROPOFOL;  Surgeon: Harvel Quale, MD;  Location: [facility];  Service: Gastroenterology;  Laterality: N/A; HPI: Pt is a 64 year old male presenting with dysphagia, abdominal pain, weight loss, and generalized weakness. Patient underwent ERCP 10/13 and laparoscopic cholecytectomy 10/14. Pt endorses changes in speech and swallowing that began over the last few months. He had MBS several years ago at outside facility with no findings of dysphagia. PMH: COPD on 2.5 L, HTN, CAD, CHF  Subjective: feels like everything is getting caught in his throat Assessment / Plan / Recommendation CHL IP CLINICAL IMPRESSIONS 01/23/2020 Clinical Impression Pt's oral phase of swallowing is relatively functional but with little to no ROM of swallowing structures for his pharyngeal swallow, still seeming most consistent with neurological etiology. He does not have anterior movement of his posterior pharyngeal wall or elevation of his velum (suspect L > R based on oral motor exam) so he cannot create a seal at his nasopharynx. Mild nasal regurgitation is noted with thin liquids that falls back down in the pharynx post-swallow. He also has almost no anterior burst of his hyoid, base of tongue retraction, pharyngeal squeeze, or  epiglottic inversion. He also appears to have osteophytes that may be causing mildly reduced UES relaxation, but this is relatively adequate considering his pharyngeal swallow appears almost passive. Residue sits mostly in his valleculae, at times filling this whole space, but the pt will bring the residue back up into his mouth to perform repeated swallows. Residue is reduced but not eliminated by this strategy. Attempted various head positions (turns L/R, tilt, chin tuck) without any significant change in function. Pt manages to achieve adequate laryngeal vestibule closure despite the above, allowing him to protect his airway well during swallow. A few instances of penetration after the swallow on thin liquid residue was sensed quickly and ejected spontaneously with a throat clear. Recommend continuing Dys 3 diet and thin liquids as tolerated by pt. Could consider trial of swallowing therapy although prognosis is difficult to establish given uncertain etiology and time post-onset.  SLP Visit Diagnosis Dysphagia, unspecified (R13.10) Attention and concentration deficit following -- Frontal lobe and executive function deficit following -- Impact on safety and function Mild aspiration risk;Risk for inadequate nutrition/hydration   CHL IP TREATMENT RECOMMENDATION 01/23/2020 Treatment Recommendations Therapy as outlined in treatment plan below   Prognosis 01/23/2020 Prognosis for Safe Diet Advancement Good Barriers to Reach Goals Time post onset;Severity of deficits Barriers/Prognosis  Comment -- CHL IP DIET RECOMMENDATION 01/23/2020 SLP Diet Recommendations Dysphagia 3 (Mech soft) solids;Thin liquid Liquid Administration via Cup;Straw Medication Administration Whole meds with liquid Compensations Slow rate;Small sips/bites;Follow solids with liquid;Clear throat intermittently Postural Changes Seated upright at 90 degrees;Remain semi-upright after after feeds/meals (Comment)   CHL IP OTHER RECOMMENDATIONS 01/23/2020  Recommended Consults -- Oral Care Recommendations Oral care BID Other Recommendations --   CHL IP FOLLOW UP RECOMMENDATIONS 01/23/2020 Follow up Recommendations Home health SLP;Outpatient SLP   CHL IP FREQUENCY AND DURATION 01/23/2020 Speech Therapy Frequency (ACUTE ONLY) min 2x/week Treatment Duration 2 weeks      CHL IP ORAL PHASE 01/23/2020 Oral Phase Impaired Oral - Pudding Teaspoon -- Oral - Pudding Cup -- Oral - Honey Teaspoon -- Oral - Honey Cup -- Oral - Nectar Teaspoon -- Oral - Nectar Cup -- Oral - Nectar Straw -- Oral - Thin Teaspoon -- Oral - Thin Cup Decreased velopharyngeal closure Oral - Thin Straw Decreased velopharyngeal closure;Nasal reflux Oral - Puree Decreased velopharyngeal closure Oral - Mech Soft Decreased velopharyngeal closure Oral - Regular -- Oral - Multi-Consistency -- Oral - Pill -- Oral Phase - Comment --  CHL IP PHARYNGEAL PHASE 01/23/2020 Pharyngeal Phase Impaired Pharyngeal- Pudding Teaspoon -- Pharyngeal -- Pharyngeal- Pudding Cup -- Pharyngeal -- Pharyngeal- Honey Teaspoon -- Pharyngeal -- Pharyngeal- Honey Cup -- Pharyngeal -- Pharyngeal- Nectar Teaspoon -- Pharyngeal -- Pharyngeal- Nectar Cup -- Pharyngeal -- Pharyngeal- Nectar Straw -- Pharyngeal -- Pharyngeal- Thin Teaspoon -- Pharyngeal -- Pharyngeal- Thin Cup Reduced pharyngeal peristalsis;Reduced epiglottic inversion;Reduced anterior laryngeal mobility;Reduced laryngeal elevation;Reduced airway/laryngeal closure;Reduced tongue base retraction;Pharyngeal residue - valleculae;Pharyngeal residue - pyriform;Penetration/Apiration after swallow Pharyngeal Material enters airway, remains ABOVE vocal cords then ejected out Pharyngeal- Thin Straw Reduced pharyngeal peristalsis;Reduced epiglottic inversion;Reduced anterior laryngeal mobility;Reduced laryngeal elevation;Reduced airway/laryngeal closure;Reduced tongue base retraction;Pharyngeal residue - valleculae;Pharyngeal residue - pyriform;Penetration/Apiration after swallow  Pharyngeal Material enters airway, remains ABOVE vocal cords then ejected out Pharyngeal- Puree Reduced pharyngeal peristalsis;Reduced epiglottic inversion;Reduced anterior laryngeal mobility;Reduced laryngeal elevation;Reduced airway/laryngeal closure;Reduced tongue base retraction;Pharyngeal residue - valleculae Pharyngeal -- Pharyngeal- Mechanical Soft Reduced pharyngeal peristalsis;Reduced epiglottic inversion;Reduced anterior laryngeal mobility;Reduced laryngeal elevation;Reduced airway/laryngeal closure;Reduced tongue base retraction;Pharyngeal residue - valleculae Pharyngeal -- Pharyngeal- Regular -- Pharyngeal -- Pharyngeal- Multi-consistency -- Pharyngeal -- Pharyngeal- Pill -- Pharyngeal -- Pharyngeal Comment --  CHL IP CERVICAL ESOPHAGEAL PHASE 01/23/2020 Cervical Esophageal Phase Impaired Pudding Teaspoon -- Pudding Cup -- Honey Teaspoon -- Honey Cup -- Nectar Teaspoon -- Nectar Cup -- Nectar Straw -- Thin Teaspoon -- Thin Cup Reduced cricopharyngeal relaxation Thin Straw Reduced cricopharyngeal relaxation Puree Reduced cricopharyngeal relaxation Mechanical Soft Reduced cricopharyngeal relaxation Regular -- Multi-consistency -- Pill -- Cervical Esophageal Comment -- Osie Bond., M.A. CCC-SLP Acute Rehabilitation Services Pager [phone] Office [phone] 01/23/2020, 1:10 PM                   Scheduled Meds: . acetaminophen  1,000 mg Oral Q8H  . amoxicillin-clavulanate  1 tablet Oral Q12H  . aspirin  81 mg Oral Daily  . budesonide (PULMICORT) nebulizer solution  0.5 mg Nebulization BID  . carvedilol  6.25 mg Oral BID  . digoxin  125 mcg Oral Daily  . docusate sodium  100 mg Oral Daily  . feeding supplement  237 mL Oral TID BM  . indomethacin  100 mg Rectal Once  . multivitamin with minerals  1 tablet Oral Daily  . pantoprazole  40 mg Oral BID AC  . spironolactone  25 mg Oral Daily   Continuous Infusions:  LOS: 9 days     Cordelia Poche, MD [facility] 01/24/2020,  2:33 PM  If 7PM-7AM, please contact night-coverage [email or web address]

## 2020-01-24 NOTE — Discharge Summary (Signed)
Physician Discharge Summary  Brolin Dambrosia WCH:[id number] DOB: [birth year] DOA: 01/15/2020  PCP: System, Provider Not In  Admit date: 01/15/2020 Discharge date: 01/25/2020  Admitted From: Home Disposition: Home  Recommendations for Outpatient Follow-up:  1. Follow up with PCP in 1 week 2. Follow up with general surgery for post-op visit 3. Follow up with neurology for remote stroke follow-up 4. Follow up with Dr. Jenetta Downer for health maintenance 5. Protonix 40 mg BID x8 weeks followed by 40 mg daily 6. Please obtain BMP/CBC in one week 7. Please follow up on the following pending results: None  Home Health: Outpatient speech therapy Equipment/Devices: None  Discharge Condition: Stable CODE STATUS: Full code Diet recommendation: Heart healthy   Brief/Interim Summary:  Admission HPI written by Loma Boston, MD   Chief Complaint: abdominal pain  HPI: Kenneth Coleman is a 64 y.o. male with a history of COPD, coronary artery disease with CHF, hypertension.  Patient is a poor historian.  He presents to the emergency department for 2 months of generalized weakness and difficulty eating with right upper quadrant abdominal pain.  In September, he was hospitalized in [place], found to have intrahepatic and extrahepatic biliary duct dilation with stones.  It appears that he had an ERCP done with a stent placement.  He was continued to have intermittent abdominal pain, the last of which was started last night.  Pain is in his right upper quadrant and is nonradiating.  Eating increases his pain.  No palliating factors.  Pain was worsening over the past day.  Denies fevers, chills, diarrhea, constipation, abdominal distention, shortness of breath, chest pain.   Hospital course:  Choledocholithiasis Diagnosed on ultrasound. Patient with a CBD biliary stent, however, imaging concerning for recurrent biliary obstruction. Patient transferred to [facility] for ERCP. Alkaline  phosphatase trending up with AST/ALT trending down. Bilirubin trended down slightly. ERCP performed on 10/13 with balloon sphincteroplasty and removal of occluded biliary stent and biliary stones/sludge. S/p lap cholecystectomy on 10/14. Outpatient follow-up.  Dysphagia In relation to esophageal thickening. S/p EGD as mentioned below. Also concern for oropharyngeal component. Speech therapy consulted on 10/15 and concern for central lesion as reason for dysphagia. MRI obtained and significant for a remote lacunar infarct in the right pons. Discussed with neurology who recommended aspirin 81 mg and outpatient stroke workup. Home health speech therapy. Patient states that he does not think the MRI finding is from a stroke from months ago but rather a stroke from years ago.  Esophageal thickening Patient underwent Upper GI endoscopy with biopsies obtained which were significant for reactive squamous mucosa. No strictures/mass noted on EGD.  Malnutrition Albumin of 1.9. Dietitian recommending protein supplement. Provided recommendations on discharge.   Acute on chronic respiratory failure with hypoxia Patient is on 2.5 L of oxygen via nasal cannula as an outpatient.  Patient required up to 4 L of oxygen via nasal cannula while inpatient with saturations down to 80%.  Appears to be secondary to possible aspiration pneumonitis versus pneumonia.  Patient is now weaned back to home 2 L of oxygen.  Left lower lobe pneumonia Likely related to aspiration.  Patient started on cefepime and Flagyl. MRSA pcr is positive. No leukocytosis or fever. Patient transitioned to Augmentin and has had 7 days of treatment. Discontinue antibiotics.  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 Non-bleeding.Treated.  Chronic diastolic heart failure Continue spironolactone, coreg. Holding lisinopril secondary to soft blood pressure.  Moderate esophagitis Gastric AVMs Noted on ERCP. AVMs reated with  [facility].  Recommendations for Protonix 40 mg BID x8 weeks followed by daily dosing.  Chronic anemia Iron panel significant for low iron. Possible component of iron deficiency anemia. Patient does not have colon cancer screening performed. Anemia is currently stable. Recommendations for colonoscopy as an outpatient with Dr. Jenetta Downer.  Discharge Diagnoses:  Principal Problem:   Choledocholithiasis Active Problems:   CHF (congestive heart failure) (HCC)   COPD (chronic obstructive pulmonary disease) (HCC)   Coronary artery disease   Hypertension   Hypokalemia   Acute on chronic respiratory failure with hypoxia (HCC)   Lobar pneumonia (HCC)   Bile obstruction   Esophageal dysphagia   Acute esophagitis   Protein-calorie malnutrition, severe    Discharge Instructions  Discharge Instructions    Call MD for:  difficulty breathing, headache or visual disturbances   Complete by: As directed    Call MD for:  redness, tenderness, or signs of infection (pain, swelling, redness, odor or green/yellow discharge around incision site)   Complete by: As directed    Call MD for:  severe uncontrolled pain   Complete by: As directed    Call MD for:  temperature >100.4   Complete by: As directed    No wound care   Complete by: As directed      Allergies as of 01/25/2020   No Known Allergies     Medication List    STOP taking these medications   HYDROcodone-acetaminophen 7.5-500 MG/15ML solution Commonly known as: LORTAB   lisinopril 2.5 MG tablet Commonly known as: ZESTRIL     TAKE these medications   acetaminophen 500 MG tablet Commonly known as: TYLENOL Take 2 tablets (1,000 mg total) by mouth every 8 (eight) hours for 3 days.   Aspirin Low Dose 81 MG EC tablet Generic drug: aspirin Take 81 mg by mouth daily.   Bevespi Aerosphere 9-4.8 MCG/ACT Aero Generic drug: Glycopyrrolate-Formoterol Take 2 puffs by mouth daily.   carvedilol 12.5 MG tablet Commonly known as: COREG Take 12.5  mg by mouth 2 (two) times daily.   cholecalciferol 25 MCG (1000 UNIT) tablet Commonly known as: VITAMIN D Take 1,000 Units by mouth daily.   digoxin 0.125 MG tablet Commonly known as: LANOXIN Take 125 mcg by mouth daily.   docusate sodium 100 MG capsule Commonly known as: COLACE Take 1 capsule (100 mg total) by mouth daily.   feeding supplement Liqd Take 237 mLs by mouth 3 (three) times daily between meals.   furosemide 20 MG tablet Commonly known as: LASIX Take 20 mg by mouth daily.   multivitamin with minerals Tabs tablet Take 1 tablet by mouth daily.   oxyCODONE 5 MG immediate release tablet Commonly known as: Oxy IR/ROXICODONE Take 1 tablet (5 mg total) by mouth every 4 (four) hours as needed for moderate pain or severe pain.   pantoprazole 40 MG tablet Commonly known as: PROTONIX Take 1 tablet (40 mg total) by mouth 2 (two) times daily before a meal.   spironolactone 25 MG tablet Commonly known as: ALDACTONE Take 25 mg by mouth daily.       Follow-up Information    [facility] Follow up on 02/11/2020.   Specialty: General Surgery Why: 2:45pm, arrive by 2:15pm for paperwork and check in process Contact information: [address] [place] [address] [phone]        Montez Morita, Quillian Quince, MD. Schedule an appointment as soon as possible for a visit in 2 week(s).   Specialty: Gastroenterology Contact  information: [address] [place] [address] [phone]              No Known Allergies  Consultations:  Gastroenterology  General surgery   Procedures/Studies: CT SOFT TISSUE NECK W CONTRAST  Result Date: 01/16/2020 CLINICAL DATA:  Esophageal mass. EXAM: CT NECK WITH CONTRAST TECHNIQUE: Multidetector CT imaging of the neck was performed using the standard protocol following the bolus administration of intravenous contrast. CONTRAST:  52mL OMNIPAQUE IOHEXOL 300 MG/ML  SOLN COMPARISON:  CT  chest scratched at CT neck with contrast 12/21/2012 FINDINGS: Pharynx and larynx: No focal mucosal or submucosal lesions are present. Nasopharynx is clear. Soft palate and tongue base are normal. Epiglottis is normal. The hypopharynx is clear. Vocal cords are midline and symmetric. The trachea is unremarkable. Salivary glands: The submandibular and parotid glands and ducts are within normal limits. Thyroid: Normal Lymph nodes: No significant cervical adenopathy is present. Vascular: Atherosclerotic changes are noted at the aortic arch great vessel origins the carotid bifurcations bilaterally. Moderate to high-grade stenosis is present on the right. No significant stenosis is present on the left. Limited intracranial: Within normal limits. Visualized orbits: The globes and orbits are within normal limits. Mastoids and visualized paranasal sinuses: Chronic right sphenoid sinus opacification is present. The paranasal sinuses and mastoid air cells are otherwise clear. Skeleton: Multilevel degenerative changes are present cervical spine. Slight degenerative anterolisthesis present at C3-4 associated with uncovertebral spurring and foraminal narrowing bilaterally. Foraminal narrowing is present in the lower cervical spine as well. No focal lytic or blastic lesions are present. Dental caries are present within the residual teeth. Upper chest: The lung apices are clear. Thoracic inlet is normal. There is some thickening of the esophagus without a discrete mass. IMPRESSION: 1. Mild thickening of the upper esophagus without a discrete mass. No pneumomediastinum. 2. No focal mucosal or submucosal lesions. 3. No significant cervical adenopathy. 4. Moderate to high-grade stenosis of the proximal right internal carotid artery. 5. Multilevel degenerative changes of the cervical spine as described. 6. Dental caries. 7. Chronic right sphenoid sinus disease. 8. Aortic Atherosclerosis (ICD10-I70.0). Electronically Signed   By:  San Morelle M.D.   On: 01/16/2020 19:36   CT CHEST W CONTRAST  Result Date: 01/16/2020 CLINICAL DATA:  Dysphagia and weight loss EXAM: CT CHEST WITH CONTRAST TECHNIQUE: Multidetector CT imaging of the chest was performed during intravenous contrast administration. CONTRAST:  60mL OMNIPAQUE IOHEXOL 300 MG/ML  SOLN COMPARISON:  October 8th 2011 FINDINGS: Cardiovascular: No significant vascular findings. No central pulmonary embolism. Normal heart size. No pericardial effusion. Atherosclerotic calcifications of the aorta. Mediastinum/Nodes: No enlarged mediastinal, hilar, or axillary lymph nodes. Thyroid gland, and esophagus demonstrate no significant findings. Lungs/Pleura: Multifocal endobronchial debris and mucus plugging most predominant in the LEFT lower lobe. There is heterogeneous opacification of the LEFT lower lobe, new since prior. Mild centrilobular nodularity in the RIGHT lower lobe. Mild to moderate centrilobular emphysema. Centrilobular nodularity in the LEFT upper lobe with a representative more focal nodule measuring 4 mm (series 5, image 33). Subpleural nodule of the RIGHT upper lobe measures 3 mm (series 5, image 88). Irregular nodule along the RIGHT paramediastinal border measures 4 mm (series 5, image 102). Upper Abdomen: Revisualization of moderate intrahepatic biliary ductal dilation, unchanged in comparison to prior. Musculoskeletal: Gynecomastia.  No acute osseous abnormality. IMPRESSION: 1. Multifocal endobronchial debris and mucus plugging most with new heterogeneous opacification of the LEFT lower lobe. Findings are concerning for aspiration and aspiration pneumonitis. 2. Centrilobular  nodularity in the LEFT upper lobe and RIGHT lower lobe, likely infectious or inflammatory. 3. Scattered pulmonary nodules measuring up to 4 mm. Recommend follow-up CT in 1 year to assess for stability. Aortic Atherosclerosis (ICD10-I70.0) and Emphysema (ICD10-J43.9). Electronically Signed   By:  Valentino Saxon MD   On: 01/16/2020 19:13   MR BRAIN WO CONTRAST  Result Date: 01/23/2020 CLINICAL DATA:  Neuro deficit, stroke suspected. EXAM: MRI HEAD WITHOUT CONTRAST TECHNIQUE: Multiplanar, multiecho pulse sequences of the brain and surrounding structures were obtained without intravenous contrast. COMPARISON:  None. FINDINGS: Brain: No diffusion-weighted signal abnormality. No intracranial hemorrhage. No midline shift, ventriculomegaly or extra-axial fluid collection. No mass lesion. Mild cerebral atrophy with ex vacuo dilatation. Moderate chronic microvascular ischemic changes. Tiny remote right pontine insult. Vascular: Normal flow voids. Skull and upper cervical spine: Normal marrow signal. Sinuses/Orbits: Normal orbits. Clear paranasal sinuses. Small left mastoid effusion. Other: None. IMPRESSION: No acute intracranial process.  Remote right pontine lacunar insult. Mild cerebral atrophy. Moderate chronic microvascular ischemic changes. Electronically Signed   By: Primitivo Gauze M.D.   On: 01/23/2020 12:01   CT ABDOMEN PELVIS W CONTRAST  Result Date: 01/15/2020 CLINICAL DATA:  Abdominal pain with 40 lb weight loss over the last 2 months. Abdominal abscess/infection suspected. EXAM: CT ABDOMEN AND PELVIS WITH CONTRAST TECHNIQUE: Multidetector CT imaging of the abdomen and pelvis was performed using the standard protocol following bolus administration of intravenous contrast. CONTRAST:  133mL OMNIPAQUE IOHEXOL 300 MG/ML  SOLN COMPARISON:  Report only from remote abdominal CT 09/07/2008 and ERCP 09/09/2008. FINDINGS: Lower chest: Mild linear scarring in the right middle lobe. The lung bases are otherwise clear. No significant pleural or pericardial effusion. Aortic and coronary artery atherosclerosis noted. Hepatobiliary: No focal hepatic abnormalities or abnormal enhancement identified. There is moderate intra and extrahepatic biliary dilatation. There is a plastic biliary stent in place. The  common hepatic duct appears to measure up to 17 mm in diameter on image 31/2 which is larger than appreciated on ultrasound the same date. There is a possible calculus in the common hepatic duct adjacent to the stent versus stent encrustation. In addition, there is an 11 mm soft tissue nodule at the hepatic hilum on image 27/2 which could be intrabiliary. The gallbladder is moderately distended without wall thickening or surrounding inflammation. Pancreas: Mild diffuse pancreatic ductal dilatation and parenchymal atrophy, previously reported. No focal mass lesion or surrounding inflammation. Spleen: Normal in size without focal abnormality. Adrenals/Urinary Tract: Both adrenal glands appear normal. The kidneys appear normal without evidence of urinary tract calculus, suspicious lesion or hydronephrosis. No bladder abnormalities are seen. Stomach/Bowel: No evidence of bowel wall thickening, distention or surrounding inflammatory change. Vascular/Lymphatic: No enlarged abdominopelvic lymph nodes are identified. Evaluation limited by paucity of intra-abdominal fat. There is diffuse aortic and branch vessel atherosclerosis with severe involvement of the common iliac arteries. No evidence of large vessel occlusion or aneurysm. The portal, superior mesenteric and splenic veins are patent. Reproductive: The prostate gland and seminal vesicles appear normal. Other: Paucity of intra-abdominal fat. No ascites or peritoneal nodularity. Musculoskeletal: No acute or significant osseous findings. Old left-sided rib fracture in advanced degenerative disc disease at L5-S1 are noted. IMPRESSION: 1. Moderate intra and extrahepatic biliary and gallbladder dilatation suspicious for recurrent biliary obstruction. A plastic biliary stent in place which is likely not functional. There is a possible calculus in the common hepatic duct adjacent to the stent versus stent encrustation. In addition, there is an 11 mm soft tissue nodule  at the  hepatic hilum which could be intrabiliary. Consider further evaluation with ERCP or MRCP. 2. Pancreatic ductal dilatation, previously reported on remote CT (unavailable). 3. Aortic Atherosclerosis (ICD10-I70.0). Electronically Signed   By: Richardean Sale M.D.   On: 01/15/2020 16:52   DG CHEST PORT 1 VIEW  Result Date: 01/16/2020 CLINICAL DATA:  Weight loss. Negative COVID test. History of CHF and hypertension. EXAM: PORTABLE CHEST 1 VIEW COMPARISON:  09/10/2017, report only FINDINGS: Patient rotated minimally right. Mild tracheal deviation to the left, suggesting a component of volume loss within the left hemithorax. Normal heart size. Atherosclerosis in the transverse aorta. No pleural effusion or pneumothorax. Mild hyperinflation and diffuse interstitial thickening. Right lung base scarring laterally. Left lower lobe airspace disease. Numerous leads and wires project over the chest. IMPRESSION: Left lower lobe airspace disease of indeterminate acuity. Although this could represent scarring, infection or aspiration cannot be excluded. Underlying hyperinflation and interstitial thickening, most consistent with COPD/chronic bronchitis. Aortic Atherosclerosis (ICD10-I70.0). Electronically Signed   By: Abigail Miyamoto M.D.   On: 01/16/2020 10:14   DG ERCP BILIARY & PANCREATIC DUCTS  Result Date: 01/21/2020 CLINICAL DATA:  ERCP.  Weight loss. EXAM: ERCP TECHNIQUE: Multiple spot images obtained with the fluoroscopic device and submitted for interpretation post-procedure. FLUOROSCOPY TIME:  5 minutes, 2 seconds COMPARISON:  CT abdomen and pelvis-01/15/2020 FINDINGS: Multiple spot fluoroscopic images the right upper abdominal quadrant during ERCP are provided for review. Initial image demonstrates an ERCP probe overlying the right upper abdominal quadrant including pre-existing biliary stent which appears partially retracted and subsequently removed. Subsequent images demonstrate selective cannulation and  opacification of the common bile duct which appears moderate to markedly dilated. Subsequent images demonstrate a biliary plasty balloon overlying expected location of ampulla. Subsequent images demonstrate utilization of a cage device for additional stone extraction. There is minimal opacification the central aspect of the intrahepatic biliary tree which appears moderately dilated. There is minimal opacification of the cystic duct without significant opacification of gallbladder. IMPRESSION: ERCP with biliary stent removal, biliary plasty and stone extraction as above. These images were submitted for radiologic interpretation only. Please see the procedural report for the amount of contrast and the fluoroscopy time utilized. Electronically Signed   By: Sandi Mariscal M.D.   On: 01/21/2020 11:37   DG Swallowing Func-Speech Pathology  Result Date: 01/23/2020 Objective Swallowing Evaluation: Type of Study: MBS-Modified Barium Swallow Study  Patient Details Name: Kainoah Bartosiewicz MRN: [id number] Date of Birth: [birth year] Today's Date: 01/23/2020 Time: SLP Start Time (ACUTE ONLY): 7564 -SLP Stop Time (ACUTE ONLY): 1142 SLP Time Calculation (min) (ACUTE ONLY): 16 min Past Medical History: Past Medical History: Diagnosis Date . CHF (congestive heart failure) ([place])  . COPD (chronic obstructive pulmonary disease) ([place])  . Coronary artery disease  . Hypertension  Past Surgical History: Past Surgical History: Procedure Laterality Date . BIOPSY  01/16/2020  Procedure: BIOPSY;  Surgeon: Montez Morita, Quillian Quince, MD;  [facility];  Service: Gastroenterology;;  esophageal biopsies @25 ,37,20 . CHOLECYSTECTOMY Bilateral 01/21/2020  Procedure: LAPAROSCOPIC CHOLECYSTECTOMY;  Surgeon: Greer Pickerel, MD;  Location: [facility];  Service: General;  Laterality: Bilateral; . ESOPHAGOGASTRODUODENOSCOPY (EGD) WITH PROPOFOL N/A 01/16/2020  Procedure: ESOPHAGOGASTRODUODENOSCOPY (EGD) WITH PROPOFOL;  Surgeon: Harvel Quale, MD;   Location[facility];  Service: Gastroenterology;  Laterality: N/A; HPI: Pt is a 64 year old male presenting with dysphagia, abdominal pain, weight loss, and generalized weakness. Patient underwent ERCP 10/13 and laparoscopic cholecytectomy 10/14. Pt endorses changes in speech and swallowing that began  over the last few months. He had MBS several years ago at outside facility with no findings of dysphagia. PMH: COPD on 2.5 L, HTN, CAD, CHF  Subjective: feels like everything is getting caught in his throat Assessment / Plan / Recommendation CHL IP CLINICAL IMPRESSIONS 01/23/2020 Clinical Impression Pt's oral phase of swallowing is relatively functional but with little to no ROM of swallowing structures for his pharyngeal swallow, still seeming most consistent with neurological etiology. He does not have anterior movement of his posterior pharyngeal wall or elevation of his velum (suspect L > R based on oral motor exam) so he cannot create a seal at his nasopharynx. Mild nasal regurgitation is noted with thin liquids that falls back down in the pharynx post-swallow. He also has almost no anterior burst of his hyoid, base of tongue retraction, pharyngeal squeeze, or epiglottic inversion. He also appears to have osteophytes that may be causing mildly reduced UES relaxation, but this is relatively adequate considering his pharyngeal swallow appears almost passive. Residue sits mostly in his valleculae, at times filling this whole space, but the pt will bring the residue back up into his mouth to perform repeated swallows. Residue is reduced but not eliminated by this strategy. Attempted various head positions (turns L/R, tilt, chin tuck) without any significant change in function. Pt manages to achieve adequate laryngeal vestibule closure despite the above, allowing him to protect his airway well during swallow. A few instances of penetration after the swallow on thin liquid residue was sensed quickly and ejected  spontaneously with a throat clear. Recommend continuing Dys 3 diet and thin liquids as tolerated by pt. Could consider trial of swallowing therapy although prognosis is difficult to establish given uncertain etiology and time post-onset.  SLP Visit Diagnosis Dysphagia, unspecified (R13.10) Attention and concentration deficit following -- Frontal lobe and executive function deficit following -- Impact on safety and function Mild aspiration risk;Risk for inadequate nutrition/hydration   CHL IP TREATMENT RECOMMENDATION 01/23/2020 Treatment Recommendations Therapy as outlined in treatment plan below   Prognosis 01/23/2020 Prognosis for Safe Diet Advancement Good Barriers to Reach Goals Time post onset;Severity of deficits Barriers/Prognosis Comment -- CHL IP DIET RECOMMENDATION 01/23/2020 SLP Diet Recommendations Dysphagia 3 (Mech soft) solids;Thin liquid Liquid Administration via Cup;Straw Medication Administration Whole meds with liquid Compensations Slow rate;Small sips/bites;Follow solids with liquid;Clear throat intermittently Postural Changes Seated upright at 90 degrees;Remain semi-upright after after feeds/meals (Comment)   CHL IP OTHER RECOMMENDATIONS 01/23/2020 Recommended Consults -- Oral Care Recommendations Oral care BID Other Recommendations --   CHL IP FOLLOW UP RECOMMENDATIONS 01/23/2020 Follow up Recommendations Home health SLP;Outpatient SLP   CHL IP FREQUENCY AND DURATION 01/23/2020 Speech Therapy Frequency (ACUTE ONLY) min 2x/week Treatment Duration 2 weeks      CHL IP ORAL PHASE 01/23/2020 Oral Phase Impaired Oral - Pudding Teaspoon -- Oral - Pudding Cup -- Oral - Honey Teaspoon -- Oral - Honey Cup -- Oral - Nectar Teaspoon -- Oral - Nectar Cup -- Oral - Nectar Straw -- Oral - Thin Teaspoon -- Oral - Thin Cup Decreased velopharyngeal closure Oral - Thin Straw Decreased velopharyngeal closure;Nasal reflux Oral - Puree Decreased velopharyngeal closure Oral - Mech Soft Decreased velopharyngeal closure  Oral - Regular -- Oral - Multi-Consistency -- Oral - Pill -- Oral Phase - Comment --  CHL IP PHARYNGEAL PHASE 01/23/2020 Pharyngeal Phase Impaired Pharyngeal- Pudding Teaspoon -- Pharyngeal -- Pharyngeal- Pudding Cup -- Pharyngeal -- Pharyngeal- Honey Teaspoon -- Pharyngeal -- Pharyngeal- Honey Cup -- Pharyngeal -- Pharyngeal- Nectar  Teaspoon -- Pharyngeal -- Pharyngeal- Nectar Cup -- Pharyngeal -- Pharyngeal- Nectar Straw -- Pharyngeal -- Pharyngeal- Thin Teaspoon -- Pharyngeal -- Pharyngeal- Thin Cup Reduced pharyngeal peristalsis;Reduced epiglottic inversion;Reduced anterior laryngeal mobility;Reduced laryngeal elevation;Reduced airway/laryngeal closure;Reduced tongue base retraction;Pharyngeal residue - valleculae;Pharyngeal residue - pyriform;Penetration/Apiration after swallow Pharyngeal Material enters airway, remains ABOVE vocal cords then ejected out Pharyngeal- Thin Straw Reduced pharyngeal peristalsis;Reduced epiglottic inversion;Reduced anterior laryngeal mobility;Reduced laryngeal elevation;Reduced airway/laryngeal closure;Reduced tongue base retraction;Pharyngeal residue - valleculae;Pharyngeal residue - pyriform;Penetration/Apiration after swallow Pharyngeal Material enters airway, remains ABOVE vocal cords then ejected out Pharyngeal- Puree Reduced pharyngeal peristalsis;Reduced epiglottic inversion;Reduced anterior laryngeal mobility;Reduced laryngeal elevation;Reduced airway/laryngeal closure;Reduced tongue base retraction;Pharyngeal residue - valleculae Pharyngeal -- Pharyngeal- Mechanical Soft Reduced pharyngeal peristalsis;Reduced epiglottic inversion;Reduced anterior laryngeal mobility;Reduced laryngeal elevation;Reduced airway/laryngeal closure;Reduced tongue base retraction;Pharyngeal residue - valleculae Pharyngeal -- Pharyngeal- Regular -- Pharyngeal -- Pharyngeal- Multi-consistency -- Pharyngeal -- Pharyngeal- Pill -- Pharyngeal -- Pharyngeal Comment --  CHL IP CERVICAL ESOPHAGEAL PHASE  01/23/2020 Cervical Esophageal Phase Impaired Pudding Teaspoon -- Pudding Cup -- Honey Teaspoon -- Honey Cup -- Nectar Teaspoon -- Nectar Cup -- Nectar Straw -- Thin Teaspoon -- Thin Cup Reduced cricopharyngeal relaxation Thin Straw Reduced cricopharyngeal relaxation Puree Reduced cricopharyngeal relaxation Mechanical Soft Reduced cricopharyngeal relaxation Regular -- Multi-consistency -- Pill -- Cervical Esophageal Comment -- Osie Bond., M.A. CCC-SLP [facility] Services Pager [phone] Office [phone] 01/23/2020, 1:10 PM              ECHOCARDIOGRAM COMPLETE  Result Date: 01/17/2020    ECHOCARDIOGRAM REPORT   Patient Name:   Anel Frandsen Date of Exam: 01/17/2020 Medical Rec #:  [id number]      Height:       69.0 in Accession #:    [id number]     Weight:       155.0 lb Date of Birth:  [birth year]      BSA:          1.854 m Patient Age:    52 years       BP:           125/58 mmHg Patient Gender: M              HR:           70 bpm. Exam Location:  Inpatient Procedure: 2D Echo Indications:    CHF- Acute Systolic G01.74  History:        Patient has no prior history of Echocardiogram examinations.                 CAD, COPD; Risk Factors:Hypertension.  Sonographer:    Mikki Santee RDCS (AE) Referring Phys: [phone] Evann TAT IMPRESSIONS  1. Left ventricular ejection fraction, by estimation, is 55 to 60%. The left ventricle has normal function. The left ventricle has no regional wall motion abnormalities. Left ventricular diastolic parameters are consistent with Grade I diastolic dysfunction (impaired relaxation).  2. Right ventricular systolic function is normal. The right ventricular size is normal. There is moderately elevated pulmonary artery systolic pressure.  3. Left atrial size was mild to moderately dilated.  4. Right atrial size was mild to moderately dilated.  5. The mitral valve is grossly normal. No evidence of mitral valve regurgitation.  6. The aortic valve is tricuspid. Aortic valve  regurgitation is mild. Comparison(s): No prior Echocardiogram. FINDINGS  Left Ventricle: Left ventricular ejection fraction, by estimation, is 55 to 60%. The left ventricle has normal function. The left ventricle has no regional wall motion abnormalities. The left  ventricular internal cavity size was normal in size. There is  no left ventricular hypertrophy. Left ventricular diastolic parameters are consistent with Grade I diastolic dysfunction (impaired relaxation). Right Ventricle: The right ventricular size is normal. No increase in right ventricular wall thickness. Right ventricular systolic function is normal. There is moderately elevated pulmonary artery systolic pressure. The tricuspid regurgitant velocity is 3.29 m/s, and with an assumed right atrial pressure of 8 mmHg, the estimated right ventricular systolic pressure is 56.3 mmHg. Left Atrium: Left atrial size was mild to moderately dilated. Right Atrium: Right atrial size was mild to moderately dilated. Pericardium: There is no evidence of pericardial effusion. Mitral Valve: The mitral valve is grossly normal. No evidence of mitral valve regurgitation. Tricuspid Valve: The tricuspid valve is grossly normal. Tricuspid valve regurgitation is mild. Aortic Valve: Veno contracta 3 mm, even at post ectopic beat. The aortic valve is tricuspid. Aortic valve regurgitation is mild. Aortic regurgitation PHT measures 691 msec. Pulmonic Valve: The pulmonic valve was not well visualized. Pulmonic valve regurgitation is not visualized. Aorta: The aortic root is normal in size and structure. IAS/Shunts: The atrial septum is grossly normal.  LEFT VENTRICLE PLAX 2D LVIDd:         4.58 cm  Diastology LVIDs:         3.13 cm  LV e' medial:    8.32 cm/s LV PW:         1.13 cm  LV E/e' medial:  10.0 LV IVS:        0.93 cm  LV e' lateral:   10.50 cm/s LVOT diam:     2.00 cm  LV E/e' lateral: 7.9 LV SV:         64 LV SV Index:   35 LVOT Area:     3.14 cm  RIGHT VENTRICLE RV S  prime:     10.90 cm/s TAPSE (M-mode): 1.6 cm LEFT ATRIUM             Index       RIGHT ATRIUM           Index LA diam:        3.40 cm 1.83 cm/m  RA Area:     19.70 cm LA Vol (A2C):   80.6 ml 43.48 ml/m RA Volume:   57.40 ml  30.96 ml/m LA Vol (A4C):   43.2 ml 23.30 ml/m LA Biplane Vol: 58.8 ml 31.72 ml/m  AORTIC VALVE LVOT Vmax:   109.00 cm/s LVOT Vmean:  70.500 cm/s LVOT VTI:    0.205 m AI PHT:      691 msec  AORTA Ao Root diam: 2.70 cm MITRAL VALVE               TRICUSPID VALVE MV Area (PHT): 3.19 cm    TR Peak grad:   43.3 mmHg MV Decel Time: 238 msec    TR Vmax:        329.00 cm/s MV E velocity: 83.20 cm/s MV A velocity: 93.00 cm/s  SHUNTS MV E/A ratio:  0.89        Systemic VTI:  0.20 m                            Systemic Diam: 2.00 cm Rudean Haskell MD Electronically signed by Rudean Haskell MD Signature Date/Time: 01/17/2020/1:59:17 PM    Final    US ABDOMEN LIMITED RUQ  Result Date: 01/15/2020 CLINICAL DATA:  Right upper quadrant  abdominal pain. History of biliary stenting for stricture in 2010. EXAM: ULTRASOUND ABDOMEN LIMITED RIGHT UPPER QUADRANT COMPARISON:  Report only from abdominal CT 09/07/2008 and ERCP 09/09/2008. Today CT is correlated. FINDINGS: Gallbladder: The sonographer reports a shadowing calculus within the gallbladder fundus. In correlating the ultrasound images with the nearly concurrent abdominal CT, I suspect this 11 mm calculus is actually within a dilated common hepatic duct. The gallbladder was moderately distended on CT and not clearly imaged on this study. Common bile duct: Diameter: The reported diameter of the common bile duct is 5 mm. However, as discussed above, there is significant intra and extrahepatic biliary dilatation as correlated with CT. The common hepatic duct measures up to 15 mm in diameter. The biliary stent is not well visualized on these images. Intraductal filling defect measuring 11 mm, most consistent with choledocholithiasis. Liver:  Mildly increased hepatic echogenicity without focal abnormality. Intrahepatic biliary dilatation. Portal vein is patent on color Doppler imaging with normal direction of blood flow towards the liver. Other: None. IMPRESSION: 1. This ultrasound, which was performed immediately prior to abdominal CT, does not optimally portray the perceived abnormalities on CT. Combining the 2 studies, there is moderate intra and extrahepatic biliary dilatation in this patient with a biliary stent. There is a probable intraductal filling defect adjacent to the proximal aspect of the stent consistent with choledocholithiasis. 2. See separate CT report. Electronically Signed   By: Richardean Sale M.D.   On: 01/15/2020 17:05     Subjective: Some abdominal soreness  Discharge Exam: Vitals:   01/25/20 0500 01/25/20 0835  BP: (!) 102/55   Pulse: 66   Resp: 17   Temp: 97.6 F (36.4 C)   SpO2: 100% 100%   Vitals:   01/24/20 1349 01/24/20 2039 01/25/20 0500 01/25/20 0835  BP: (!) 105/52 122/73 (!) 102/55   Pulse: 66 76 66   Resp: 17 17 17    Temp: 97.6 F (36.4 C) (!) 97.5 F (36.4 C) 97.6 F (36.4 C)   TempSrc: Oral     SpO2: 100% 100% 100% 100%  Weight:      Height:        General: Pt is alert, awake, not in acute distress Cardiovascular: RRR, S1/S2 +, no rubs, no gallops Respiratory: CTA bilaterally, no wheezing, no rhonchi Abdominal: Soft, mildly tender, ND, bowel sounds + Extremities: no edema, no cyanosis    The results of significant diagnostics from this hospitalization (including imaging, microbiology, ancillary and laboratory) are listed below for reference.     Microbiology: Recent Results (from the past 240 hour(s))  MRSA PCR Screening     Status: Abnormal   Collection Time: 01/17/20 12:58 PM   Specimen: Nasopharyngeal  Result Value Ref Range Status   MRSA by PCR POSITIVE (A) NEGATIVE Final    Comment:        The GeneXpert MRSA Assay (FDA approved for NASAL specimens only), is one  component of a comprehensive MRSA colonization surveillance program. It is not intended to diagnose MRSA infection nor to guide or monitor treatment for MRSA infections. RESULT CALLED TO, READ BACK BY AND VERIFIED WITH: L BULLINS AT 1723 ON 01/17/2020 BY MOSLEY,J Performed at [facility], [address], [place], [place] [address]      Labs: BNP (last 3 results) No results for input(s): BNP in the last 8760 hours. Basic Metabolic Panel: Recent Labs  Lab 01/19/20 0141 01/20/20 0401 01/21/20 0445 01/22/20 0056 01/23/20 0104  NA 134* 133* 133* 133* 138  K 4.6 4.8 4.8 4.9 4.2  CL 100 98 98 94* 100  CO2 28 29 26 31  33*  GLUCOSE 103* 85 102* 155* 115*  BUN <5* <5* 9 10 7*  CREATININE 0.41* 0.37* 0.41* 0.59* 0.47*  CALCIUM 8.3* 8.5* 8.4* 8.5* 8.2*   Liver Function Tests: Recent Labs  Lab 01/19/20 0141 01/20/20 0401 01/21/20 0445 01/22/20 0056 01/23/20 0104  AST 100* 90* 45* 39 37  ALT 75* 70* 51* 47* 38  ALKPHOS 657* 679* 561* 530* 499*  BILITOT 1.9* 1.8* 1.2 1.3* 1.0  PROT 6.5 6.7 6.4* 6.6 5.8*  ALBUMIN 1.9* 1.9* 1.9* 2.0* 1.9*   No results for input(s): LIPASE, AMYLASE in the last 168 hours. No results for input(s): AMMONIA in the last 168 hours. CBC: Recent Labs  Lab 01/20/20 0401 01/21/20 0445 01/22/20 0056 01/23/20 0104 01/24/20 1020  WBC 7.9 6.6 11.7* 9.5 8.6  HGB 8.8* 8.6* 9.1* 7.9* 8.3*  HCT 28.7* 28.6* 30.7* 26.9* 27.8*  MCV 81.1 81.5 83.4 83.3 86.3  PLT 552* 532* 623* 484* 501*   Cardiac Enzymes: No results for input(s): CKTOTAL, CKMB, CKMBINDEX, TROPONINI in the last 168 hours. BNP: Invalid input(s): POCBNP CBG: No results for input(s): GLUCAP in the last 168 hours. D-Dimer No results for input(s): DDIMER in the last 72 hours. Hgb A1c No results for input(s): HGBA1C in the last 72 hours. Lipid Profile No results for input(s): CHOL, HDL, LDLCALC, TRIG, CHOLHDL, LDLDIRECT in the last 72 hours. Thyroid function studies No results for  input(s): TSH, T4TOTAL, T3FREE, THYROIDAB in the last 72 hours.  Invalid input(s): FREET3 Anemia work up No results for input(s): VITAMINB12, FOLATE, FERRITIN, TIBC, IRON, RETICCTPCT in the last 72 hours. Urinalysis    Component Value Date/Time   COLORURINE AMBER (A) 01/15/2020 1355   APPEARANCEUR CLEAR 01/15/2020 1355   LABSPEC 1.016 01/15/2020 1355   PHURINE 5.0 01/15/2020 1355   GLUCOSEU NEGATIVE 01/15/2020 1355   HGBUR NEGATIVE 01/15/2020 1355   BILIRUBINUR NEGATIVE 01/15/2020 1355   KETONESUR NEGATIVE 01/15/2020 1355   PROTEINUR NEGATIVE 01/15/2020 1355   NITRITE NEGATIVE 01/15/2020 1355   LEUKOCYTESUR NEGATIVE 01/15/2020 1355   Sepsis Labs Invalid input(s): PROCALCITONIN,  WBC,  LACTICIDVEN Microbiology Recent Results (from the past 240 hour(s))  MRSA PCR Screening     Status: Abnormal   Collection Time: 01/17/20 12:58 PM   Specimen: Nasopharyngeal  Result Value Ref Range Status   MRSA by PCR POSITIVE (A) NEGATIVE Final    Comment:        The GeneXpert MRSA Assay (FDA approved for NASAL specimens only), is one component of a comprehensive MRSA colonization surveillance program. It is not intended to diagnose MRSA infection nor to guide or monitor treatment for MRSA infections. RESULT CALLED TO, READ BACK BY AND VERIFIED WITH: L BULLINS AT 1723 ON 01/17/2020 BY MOSLEY,J Performed at [facility], [address] [place], [place] [address]      Time coordinating discharge: 35 minutes  SIGNED:   Cordelia Poche, MD [facility] 01/25/2020, 2:08 PM

## 2020-01-24 NOTE — Progress Notes (Signed)
Patient ID: Kenneth Coleman, male   DOB: 09-Apr-1956, 64 y.o.   MRN: [id number] [facility] Surgery Progress Note:   3 Days Post-Op  Subjective: Mental status is clear.  Complaints none. Objective: Vital signs in last 24 hours: Temp:  [97.7 F (36.5 C)-97.9 F (36.6 C)] 97.8 F (36.6 C) (10/17 0824) Pulse Rate:  [60-80] 68 (10/17 0824) Resp:  [16-18] 18 (10/17 0824) BP: (100-105)/(54-57) 105/54 (10/17 0824) SpO2:  [97 %-98 %] 97 % (10/17 0824)  Intake/Output from previous day: 10/16 0701 - 10/17 0700 In: 177 [P.O.:177] Out: 600 [Urine:600] Intake/Output this shift: No intake/output data recorded.  Physical Exam: Work of breathing is OK.  Eating solids.  He doesn't have very good dentition and this could be causing some degree of dysphagia   Lab Results:  Results for orders placed or performed during the hospital encounter of 01/15/20 (from the past 48 hour(s))  Comprehensive metabolic panel     Status: Abnormal   Collection Time: 01/23/20  1:04 AM  Result Value Ref Range   Sodium 138 135 - 145 mmol/L   Potassium 4.2 3.5 - 5.1 mmol/L   Chloride 100 98 - 111 mmol/L   CO2 33 (H) 22 - 32 mmol/L   Glucose, Bld 115 (H) 70 - 99 mg/dL    Comment: Glucose reference range applies only to samples taken after fasting for at least 8 hours.   BUN 7 (L) 8 - 23 mg/dL   Creatinine, Ser 0.47 (L) 0.61 - 1.24 mg/dL   Calcium 8.2 (L) 8.9 - 10.3 mg/dL   Total Protein 5.8 (L) 6.5 - 8.1 g/dL   Albumin 1.9 (L) 3.5 - 5.0 g/dL   AST 37 15 - 41 U/L   ALT 38 0 - 44 U/L   Alkaline Phosphatase 499 (H) 38 - 126 U/L   Total Bilirubin 1.0 0.3 - 1.2 mg/dL   GFR, Estimated >60 >60 mL/min   Anion gap 5 5 - 15    Comment: Performed at [facility], [place] [address] [place], [place] [address]  CBC     Status: Abnormal   Collection Time: 01/23/20  1:04 AM  Result Value Ref Range   WBC 9.5 4.0 - 10.5 K/uL   RBC 3.23 (L) 4.22 - 5.81 MIL/uL   Hemoglobin 7.9 (L) 13.0 - 17.0 g/dL   HCT 26.9 (L) 39 - 52  %   MCV 83.3 80.0 - 100.0 fL   MCH 24.5 (L) 26.0 - 34.0 pg   MCHC 29.4 (L) 30.0 - 36.0 g/dL   RDW 15.8 (H) 11.5 - 15.5 %   Platelets 484 (H) 150 - 400 K/uL   nRBC 0.0 0.0 - 0.2 %    Comment: Performed at [place] [address] [place], [place] [address]    Radiology/Results: MR BRAIN WO CONTRAST  Result Date: 01/23/2020 CLINICAL DATA:  Neuro deficit, stroke suspected. EXAM: MRI HEAD WITHOUT CONTRAST TECHNIQUE: Multiplanar, multiecho pulse sequences of the brain and surrounding structures were obtained without intravenous contrast. COMPARISON:  None. FINDINGS: Brain: No diffusion-weighted signal abnormality. No intracranial hemorrhage. No midline shift, ventriculomegaly or extra-axial fluid collection. No mass lesion. Mild cerebral atrophy with ex vacuo dilatation. Moderate chronic microvascular ischemic changes. Tiny remote right pontine insult. Vascular: Normal flow voids. Skull and upper cervical spine: Normal marrow signal. Sinuses/Orbits: Normal orbits. Clear paranasal sinuses. Small left mastoid effusion. Other: None. IMPRESSION: No acute intracranial process.  Remote right pontine lacunar insult. Mild cerebral atrophy. Moderate chronic  microvascular ischemic changes. Electronically Signed   By: Primitivo Gauze M.D.   On: 01/23/2020 12:01   DG Swallowing Func-Speech Pathology  Result Date: 01/23/2020 Objective Swallowing Evaluation: Type of Study: MBS-Modified Barium Swallow Study  Patient Details Name: Kenneth Coleman MRN: [id number] Date of Birth: May 08, 1955 Today's Date: 01/23/2020 Time: SLP Start Time (ACUTE ONLY): 7096 -SLP Stop Time (ACUTE ONLY): 1142 SLP Time Calculation (min) (ACUTE ONLY): 16 min Past Medical History: Past Medical History: Diagnosis Date . CHF (congestive heart failure) ([place])  . COPD (chronic obstructive pulmonary disease) ([place])  . Coronary artery disease  . Hypertension  Past Surgical History: Past Surgical History: Procedure Laterality Date . BIOPSY   01/16/2020  Procedure: BIOPSY;  Surgeon: Montez Morita, Quillian Quince, MD;  [facility];  Service: Gastroenterology;;  esophageal biopsies @25 ,37,20 . CHOLECYSTECTOMY Bilateral 01/21/2020  Procedure: LAPAROSCOPIC CHOLECYSTECTOMY;  Surgeon: Greer Pickerel, MD;  Location: [place];  Service: General;  Laterality: Bilateral; . ESOPHAGOGASTRODUODENOSCOPY (EGD) WITH PROPOFOL N/A 01/16/2020  Procedure: ESOPHAGOGASTRODUODENOSCOPY (EGD) WITH PROPOFOL;  Surgeon: Harvel Quale, MD;  [facility];  Service: Gastroenterology;  Laterality: N/A; HPI: Pt is a 65 year old male presenting with dysphagia, abdominal pain, weight loss, and generalized weakness. Patient underwent ERCP 10/13 and laparoscopic cholecytectomy 10/14. Pt endorses changes in speech and swallowing that began over the last few months. He had MBS several years ago at outside facility with no findings of dysphagia. PMH: COPD on 2.5 L, HTN, CAD, CHF  Subjective: feels like everything is getting caught in his throat Assessment / Plan / Recommendation CHL IP CLINICAL IMPRESSIONS 01/23/2020 Clinical Impression Pt's oral phase of swallowing is relatively functional but with little to no ROM of swallowing structures for his pharyngeal swallow, still seeming most consistent with neurological etiology. He does not have anterior movement of his posterior pharyngeal wall or elevation of his velum (suspect L > R based on oral motor exam) so he cannot create a seal at his nasopharynx. Mild nasal regurgitation is noted with thin liquids that falls back down in the pharynx post-swallow. He also has almost no anterior burst of his hyoid, base of tongue retraction, pharyngeal squeeze, or epiglottic inversion. He also appears to have osteophytes that may be causing mildly reduced UES relaxation, but this is relatively adequate considering his pharyngeal swallow appears almost passive. Residue sits mostly in his valleculae, at times filling this whole  space, but the pt will bring the residue back up into his mouth to perform repeated swallows. Residue is reduced but not eliminated by this strategy. Attempted various head positions (turns L/R, tilt, chin tuck) without any significant change in function. Pt manages to achieve adequate laryngeal vestibule closure despite the above, allowing him to protect his airway well during swallow. A few instances of penetration after the swallow on thin liquid residue was sensed quickly and ejected spontaneously with a throat clear. Recommend continuing Dys 3 diet and thin liquids as tolerated by pt. Could consider trial of swallowing therapy although prognosis is difficult to establish given uncertain etiology and time post-onset.  SLP Visit Diagnosis Dysphagia, unspecified (R13.10) Attention and concentration deficit following -- Frontal lobe and executive function deficit following -- Impact on safety and function Mild aspiration risk;Risk for inadequate nutrition/hydration   CHL IP TREATMENT RECOMMENDATION 01/23/2020 Treatment Recommendations Therapy as outlined in treatment plan below   Prognosis 01/23/2020 Prognosis for Safe Diet Advancement Good Barriers to Reach Goals Time post onset;Severity of deficits Barriers/Prognosis Comment -- CHL IP DIET RECOMMENDATION 01/23/2020 SLP Diet  Recommendations Dysphagia 3 (Mech soft) solids;Thin liquid Liquid Administration via Cup;Straw Medication Administration Whole meds with liquid Compensations Slow rate;Small sips/bites;Follow solids with liquid;Clear throat intermittently Postural Changes Seated upright at 90 degrees;Remain semi-upright after after feeds/meals (Comment)   CHL IP OTHER RECOMMENDATIONS 01/23/2020 Recommended Consults -- Oral Care Recommendations Oral care BID Other Recommendations --   CHL IP FOLLOW UP RECOMMENDATIONS 01/23/2020 Follow up Recommendations Home health SLP;Outpatient SLP   CHL IP FREQUENCY AND DURATION 01/23/2020 Speech Therapy Frequency (ACUTE  ONLY) min 2x/week Treatment Duration 2 weeks      CHL IP ORAL PHASE 01/23/2020 Oral Phase Impaired Oral - Pudding Teaspoon -- Oral - Pudding Cup -- Oral - Honey Teaspoon -- Oral - Honey Cup -- Oral - Nectar Teaspoon -- Oral - Nectar Cup -- Oral - Nectar Straw -- Oral - Thin Teaspoon -- Oral - Thin Cup Decreased velopharyngeal closure Oral - Thin Straw Decreased velopharyngeal closure;Nasal reflux Oral - Puree Decreased velopharyngeal closure Oral - Mech Soft Decreased velopharyngeal closure Oral - Regular -- Oral - Multi-Consistency -- Oral - Pill -- Oral Phase - Comment --  CHL IP PHARYNGEAL PHASE 01/23/2020 Pharyngeal Phase Impaired Pharyngeal- Pudding Teaspoon -- Pharyngeal -- Pharyngeal- Pudding Cup -- Pharyngeal -- Pharyngeal- Honey Teaspoon -- Pharyngeal -- Pharyngeal- Honey Cup -- Pharyngeal -- Pharyngeal- Nectar Teaspoon -- Pharyngeal -- Pharyngeal- Nectar Cup -- Pharyngeal -- Pharyngeal- Nectar Straw -- Pharyngeal -- Pharyngeal- Thin Teaspoon -- Pharyngeal -- Pharyngeal- Thin Cup Reduced pharyngeal peristalsis;Reduced epiglottic inversion;Reduced anterior laryngeal mobility;Reduced laryngeal elevation;Reduced airway/laryngeal closure;Reduced tongue base retraction;Pharyngeal residue - valleculae;Pharyngeal residue - pyriform;Penetration/Apiration after swallow Pharyngeal Material enters airway, remains ABOVE vocal cords then ejected out Pharyngeal- Thin Straw Reduced pharyngeal peristalsis;Reduced epiglottic inversion;Reduced anterior laryngeal mobility;Reduced laryngeal elevation;Reduced airway/laryngeal closure;Reduced tongue base retraction;Pharyngeal residue - valleculae;Pharyngeal residue - pyriform;Penetration/Apiration after swallow Pharyngeal Material enters airway, remains ABOVE vocal cords then ejected out Pharyngeal- Puree Reduced pharyngeal peristalsis;Reduced epiglottic inversion;Reduced anterior laryngeal mobility;Reduced laryngeal elevation;Reduced airway/laryngeal closure;Reduced tongue base  retraction;Pharyngeal residue - valleculae Pharyngeal -- Pharyngeal- Mechanical Soft Reduced pharyngeal peristalsis;Reduced epiglottic inversion;Reduced anterior laryngeal mobility;Reduced laryngeal elevation;Reduced airway/laryngeal closure;Reduced tongue base retraction;Pharyngeal residue - valleculae Pharyngeal -- Pharyngeal- Regular -- Pharyngeal -- Pharyngeal- Multi-consistency -- Pharyngeal -- Pharyngeal- Pill -- Pharyngeal -- Pharyngeal Comment --  CHL IP CERVICAL ESOPHAGEAL PHASE 01/23/2020 Cervical Esophageal Phase Impaired Pudding Teaspoon -- Pudding Cup -- Honey Teaspoon -- Honey Cup -- Nectar Teaspoon -- Nectar Cup -- Nectar Straw -- Thin Teaspoon -- Thin Cup Reduced cricopharyngeal relaxation Thin Straw Reduced cricopharyngeal relaxation Puree Reduced cricopharyngeal relaxation Mechanical Soft Reduced cricopharyngeal relaxation Regular -- Multi-consistency -- Pill -- Cervical Esophageal Comment -- Osie Bond., M.A. [facility] Pager [phone] Office [phone] 01/23/2020, 1:10 PM               Anti-infectives: Anti-infectives (From admission, onward)   Start     Dose/Rate Route Frequency Ordered Stop   01/21/20 1000  cefTRIAXone (ROCEPHIN) 2 g in sodium chloride 0.9 % 100 mL IVPB        2 g 200 mL/hr over 30 Minutes Intravenous On call to O.R. 01/21/20 0913 01/22/20 0831   01/20/20 2200  amoxicillin-clavulanate (AUGMENTIN) 875-125 MG per tablet 1 tablet        1 tablet Oral Every 12 hours 01/20/20 1452     01/17/20 1400  ceFEPIme (MAXIPIME) 2 g in sodium chloride 0.9 % 100 mL IVPB  Status:  Discontinued        2 g 200 mL/hr over 30 Minutes Intravenous  Every 8 hours 01/17/20 1201 01/20/20 1452   01/17/20 1400  metroNIDAZOLE (FLAGYL) IVPB 500 mg  Status:  Discontinued        500 mg 100 mL/hr over 60 Minutes Intravenous Every 8 hours 01/17/20 1257 01/20/20 1452   01/16/20 1400  ceFEPIme (MAXIPIME) 1 g in sodium chloride 0.9 % 100 mL IVPB  Status:  Discontinued         1 g 200 mL/hr over 30 Minutes Intravenous Every 8 hours 01/16/20 1251 01/17/20 1201      Assessment/Plan: Problem List: Patient Active Problem List   Diagnosis Date Noted  . Protein-calorie malnutrition, severe 01/23/2020  . Acute esophagitis   . Bile obstruction   . Esophageal dysphagia   . Lobar pneumonia ([place]) 01/17/2020  . Acute on chronic respiratory failure with hypoxia ([place]) 01/16/2020  . Choledocholithiasis 01/15/2020  . Hypokalemia 01/15/2020  . CHF (congestive heart failure) ([place])   . COPD (chronic obstructive pulmonary disease) ([place])   . Coronary artery disease   . Hypertension     Suggest that he have extraction completion and dentures as this will likely improve his nutritional status and his swallowing.   3 Days Post-Op    LOS: 9 days   Matt B. Hassell Done, MD, [facility] [phone] to reach the surgeon on call.    01/24/2020 8:42 AM

## 2020-01-25 DIAGNOSIS — K8050 Calculus of bile duct without cholangitis or cholecystitis without obstruction: Secondary | ICD-10-CM | POA: Diagnosis not present

## 2020-01-25 MED ORDER — ENSURE ENLIVE PO LIQD: 237.0000 mL | Freq: Three times a day (TID) | ORAL | Status: AC

## 2020-01-25 MED ORDER — PANTOPRAZOLE SODIUM 40 MG PO TBEC: 40.0000 mg | DELAYED_RELEASE_TABLET | Freq: Two times a day (BID) | ORAL | 0 refills | Status: DC

## 2020-01-25 MED ORDER — DOCUSATE SODIUM 100 MG PO CAPS
100.0000 mg | ORAL_CAPSULE | Freq: Every day | ORAL | 0 refills | Status: DC
Start: 2020-01-25 — End: 2022-02-23

## 2020-01-25 MED ORDER — ACETAMINOPHEN 500 MG PO TABS: 1000.0000 mg | ORAL_TABLET | Freq: Three times a day (TID) | ORAL | Status: AC

## 2020-01-25 MED ORDER — ADULT MULTIVITAMIN W/MINERALS CH: 1.0000 | ORAL_TABLET | Freq: Every day | ORAL | Status: DC

## 2020-01-25 NOTE — Progress Notes (Signed)
Brief progress note:  Subjective --  NAEO. Pain controlled, reports mild soreness in abdomen. toelrating PO and having bowel function  PE -- Appears comfortable and non-toxic Abdomen is soft, appropriately tender without guarding or peritonitis, incisions c/d/i without surrounding cellulitis    A&P -- Remains stable for discharge from surgical perspective.  Follow up provided.    Obie Dredge, [facility]

## 2020-01-25 NOTE — Care Management (Addendum)
Patient from [place]. Consult to arrange OP speech. Spoke to patient . [facility] is close to his home. Discussed [facility] [address], [place] [address] phone [phone]  Patient would like to go there. NCM will need patient's PCP name and contact information. Patient unsure instructed NCM to call [facility] and ask.   Called  [facility] [address] [place] [address] phone [phone]  Spoke with Malachy Mood they accept patient's insurance she will fax referral form for hospital MD to sign.   [facility] [phone], called patient's PCP is Noah Delaine.  Received referral form, NCM completed and secure chatted MD for signature. Once signed needs to be faxed to [place]

## 2020-01-27 NOTE — Anesthesia Postprocedure Evaluation (Signed)
Anesthesia Post Note  Patient: Kenneth Coleman  Procedure(s) Performed: ENDOSCOPIC RETROGRADE CHOLANGIOPANCREATOGRAPHY (ERCP) WITH PROPOFOL (N/A ) BILIARY DILITATION (N/A ) STENT REMOVAL REMOVAL OF STONES HOT HEMOSTASIS (ARGON PLASMA COAGULATION/BICAP) (N/A ) HEMOSTASIS CLIP PLACEMENT ESOPHAGOGASTRODUODENOSCOPY (EGD) WITH PROPOFOL (N/A )     Patient location during evaluation: PACU Anesthesia Type: General Level of consciousness: awake and alert Pain management: pain level controlled Vital Signs Assessment: post-procedure vital signs reviewed and stable Respiratory status: spontaneous breathing, nonlabored ventilation, respiratory function stable and patient connected to nasal cannula oxygen Cardiovascular status: blood pressure returned to baseline and stable Postop Assessment: no apparent nausea or vomiting Anesthetic complications: no   No complications documented.  Last Vitals:  Vitals:   01/25/20 0500 01/25/20 0835  BP: (!) 102/55   Pulse: 66   Resp: 17   Temp: 36.4 C   SpO2: 100% 100%    Last Pain:  Vitals:   01/25/20 1211  TempSrc:   PainSc: 7                  Timberly Yott,Darivs COKER

## 2020-03-08 ENCOUNTER — Encounter (INDEPENDENT_AMBULATORY_CARE_PROVIDER_SITE_OTHER): Payer: Self-pay | Admitting: Gastroenterology

## 2020-04-13 ENCOUNTER — Ambulatory Visit (INDEPENDENT_AMBULATORY_CARE_PROVIDER_SITE_OTHER): Payer: Medicare Other | Admitting: Gastroenterology

## 2021-03-13 IMAGING — MR MR HEAD W/O CM
12 of 13 series · 44 of 48 positions shown · non-contrast
Comparison: None.

CLINICAL DATA: Neuro deficit, stroke suspected.

EXAM:
MRI HEAD WITHOUT CONTRAST
TECHNIQUE: Multiplanar, multiecho pulse sequences of the brain and surrounding
structures were obtained without intravenous contrast.

[Series 5: DWI · axial · 3.0mm · 0.88mm/px · z∈[-41,+89]mm · 8 of 96 slices shown (1 of 4)]
[im 1/96]
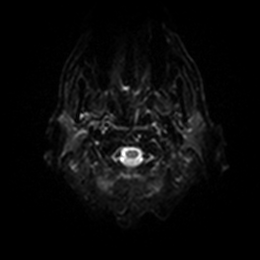
[im 14/96]
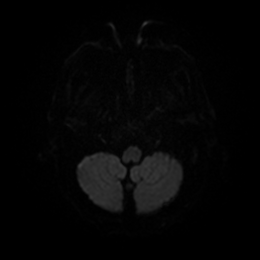
[im 28/96]
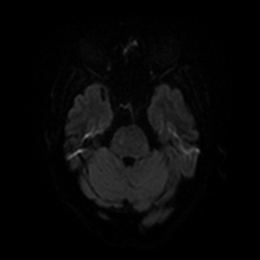
[im 41/96]
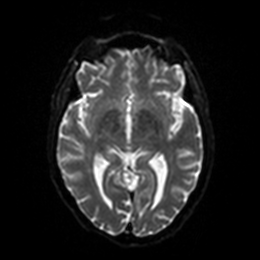
[im 55/96]
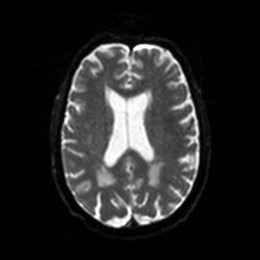
[im 68/96]
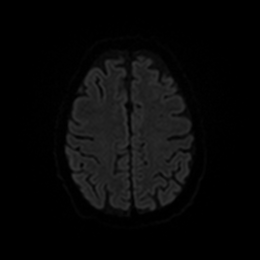
[im 82/96]
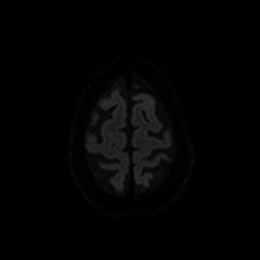
[im 96/96]
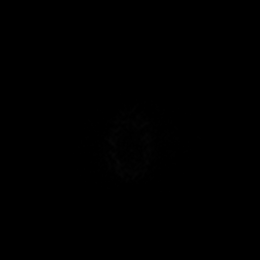

[Series 6: DWI · axial · 3.0mm · 0.88mm/px · z∈[-41,+89]mm · 4 of 47 slices shown (2 of 4)]
[im 1/47]
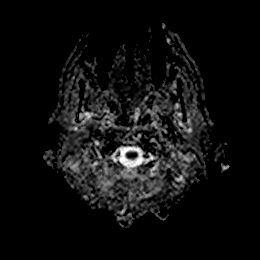
[im 16/47]
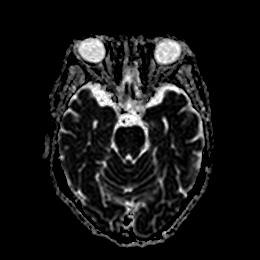
[im 31/47]
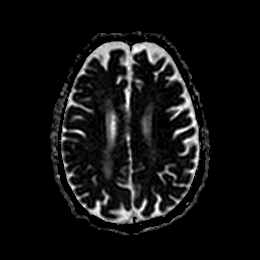
[im 47/47]
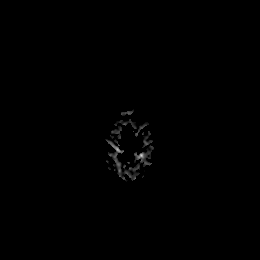

[Series 7: DWI · coronal · 4.0mm · 0.88mm/px · 5 of 72 slices shown (3 of 4)]
[im 1/72]
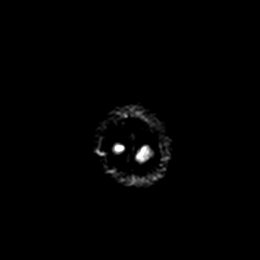
[im 18/72]
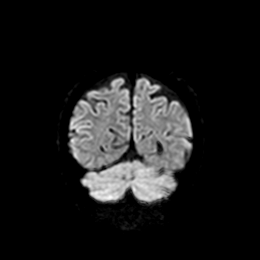
[im 36/72]
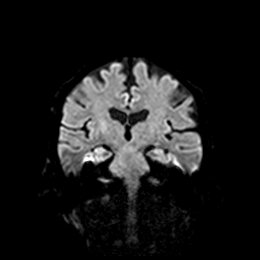
[im 54/72]
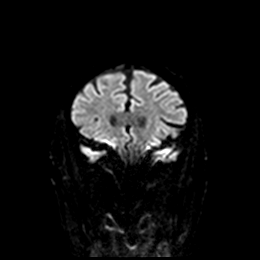
[im 72/72]
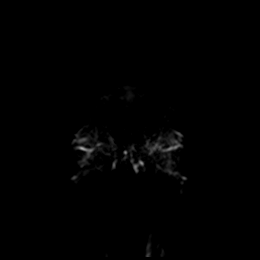

[Series 8: DWI · coronal · 4.0mm · 0.88mm/px · 3 of 34 slices shown (4 of 4)]
[im 1/34]
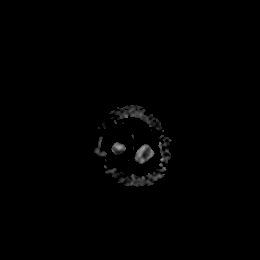
[im 17/34]
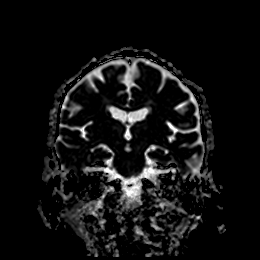
[im 34/34]
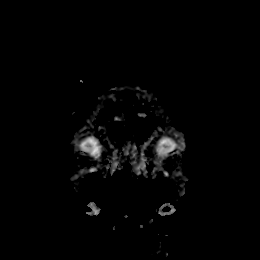

[Series 9: T1 · sagittal · 5.0mm · 0.75mm/px · 2 of 23 slices shown]
[im 1/23]
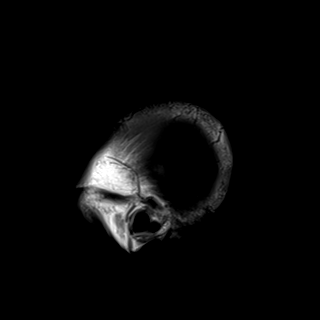
[im 23/23]
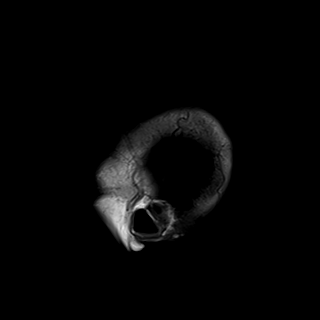

[Series 10: T2 · axial · 5.0mm · 0.72mm/px · z∈[-48,+96]mm · 2 of 27 slices shown (1 of 2)]
[im 1/27]
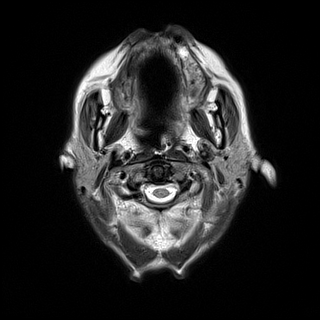
[im 27/27]
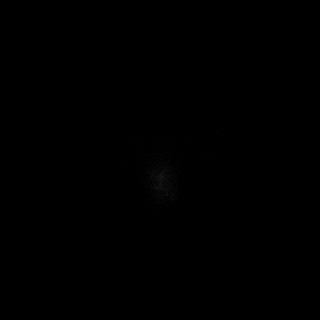

[Series 11: FLAIR · axial · 5.0mm · 0.45mm/px · z∈[-48,+96]mm · 2 of 27 slices shown]
[im 1/27]
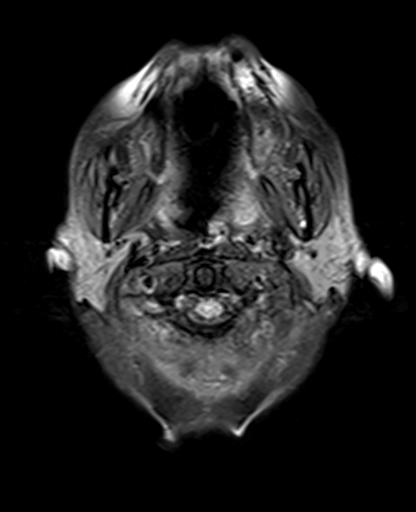
[im 27/27]
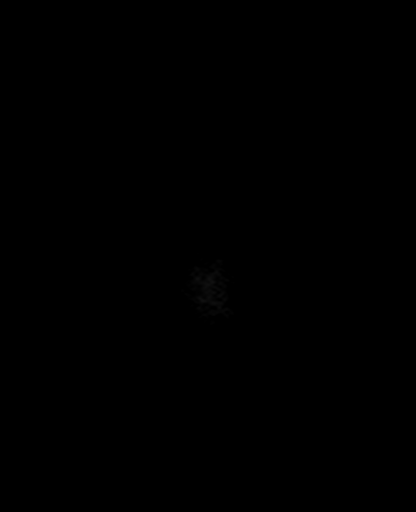

[Series 12: mag_images · axial · 3.0mm · 0.90mm/px · z∈[-61,+102]mm · 4 of 60 slices shown]
[im 1/60]
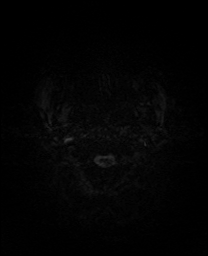
[im 20/60]
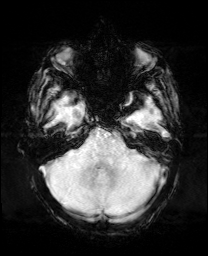
[im 40/60]
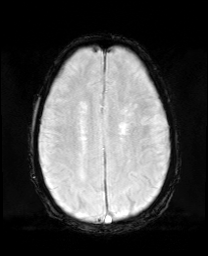
[im 60/60]
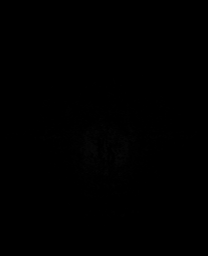

[Series 13: pha_images · axial · 3.0mm · 0.90mm/px · z∈[-61,+102]mm · 4 of 60 slices shown]
[im 1/60]
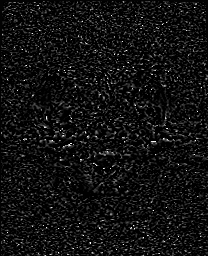
[im 20/60]
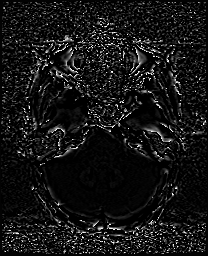
[im 40/60]
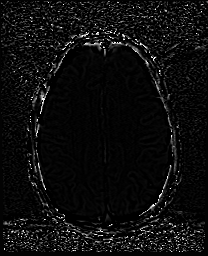
[im 60/60]
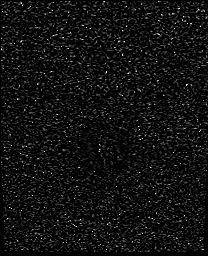

[Series 14: swi_images · axial · 3.0mm · 0.90mm/px · z∈[-61,+102]mm · 4 of 60 slices shown]
[im 1/60]
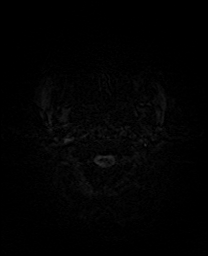
[im 20/60]
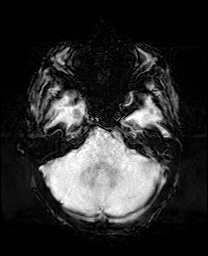
[im 40/60]
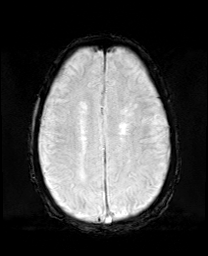
[im 60/60]
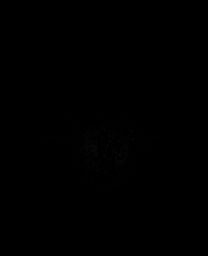

[Series 15: mip_images(sw) · axial · 24.0mm · 0.90mm/px · z∈[-51,+92]mm · 4 of 53 slices shown]
[im 1/53]
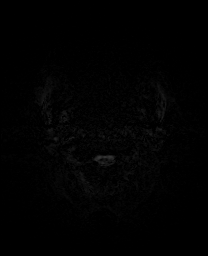
[im 18/53]
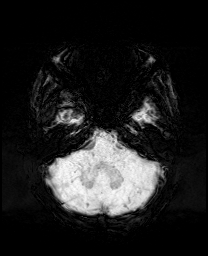
[im 35/53]
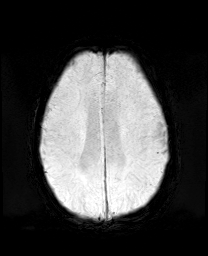
[im 53/53]
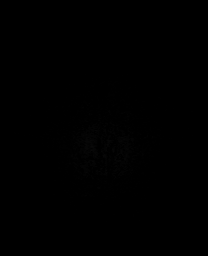

[Series 17: T2 · coronal · 5.0mm · 0.34mm/px · 2 of 31 slices shown (2 of 2)]
[im 1/31]
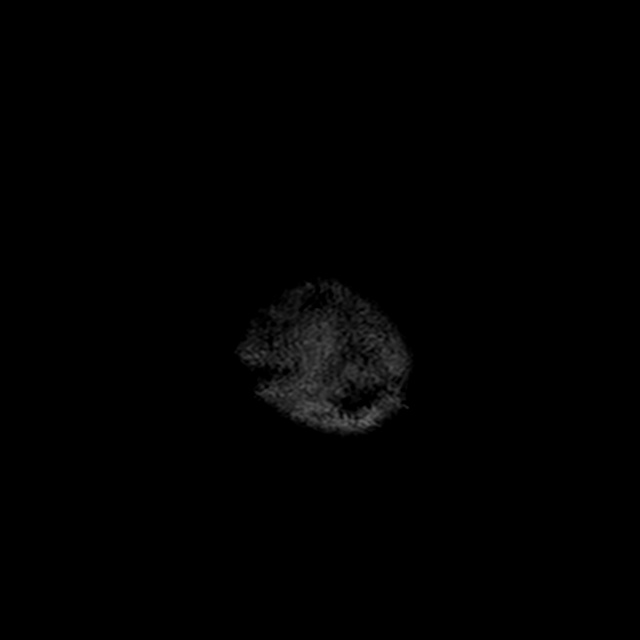
[im 31/31]
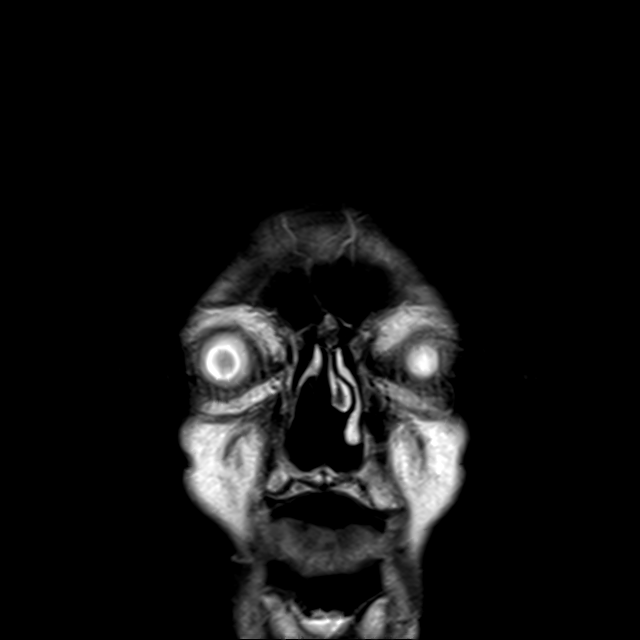

[44 of 48 positions shown; findings below may reference images not displayed]

FINDINGS: Brain: No diffusion-weighted signal abnormality. No intracranial
hemorrhage. No midline shift, ventriculomegaly or extra-axial fluid
collection. No mass lesion. Mild cerebral atrophy with ex vacuo
dilatation. Moderate chronic microvascular ischemic changes. Tiny
remote right pontine insult.

Vascular: Normal flow voids.

Skull and upper cervical spine: Normal marrow signal.

Sinuses/Orbits: Normal orbits. Clear paranasal sinuses. Small left
mastoid effusion.

Other: None.
IMPRESSION: No acute intracranial process.  Remote right pontine lacunar insult.

Mild cerebral atrophy. Moderate chronic microvascular ischemic
changes.

## 2022-02-17 ENCOUNTER — Encounter (HOSPITAL_COMMUNITY): Payer: Self-pay

## 2022-02-21 ENCOUNTER — Inpatient Hospital Stay (HOSPITAL_COMMUNITY): Payer: Medicare Other

## 2022-02-21 ENCOUNTER — Inpatient Hospital Stay (HOSPITAL_COMMUNITY)
Admission: RE | Admit: 2022-02-21 | Discharge: 2022-02-28 | DRG: 166 | Disposition: A | Discharge status: Home Health | Payer: Medicare Other | Source: Ambulatory Visit | Attending: Family Medicine | Admitting: Family Medicine

## 2022-02-21 ENCOUNTER — Encounter (HOSPITAL_COMMUNITY): Payer: Self-pay | Admitting: Internal Medicine

## 2022-02-21 DIAGNOSIS — J439 Emphysema, unspecified: Secondary | ICD-10-CM | POA: Diagnosis present

## 2022-02-21 DIAGNOSIS — Z7982 Long term (current) use of aspirin: Secondary | ICD-10-CM | POA: Diagnosis not present

## 2022-02-21 DIAGNOSIS — R918 Other nonspecific abnormal finding of lung field: Secondary | ICD-10-CM | POA: Diagnosis present

## 2022-02-21 DIAGNOSIS — I2510 Atherosclerotic heart disease of native coronary artery without angina pectoris: Secondary | ICD-10-CM | POA: Diagnosis present

## 2022-02-21 DIAGNOSIS — I509 Heart failure, unspecified: Secondary | ICD-10-CM

## 2022-02-21 DIAGNOSIS — Z79899 Other long term (current) drug therapy: Secondary | ICD-10-CM | POA: Diagnosis not present

## 2022-02-21 DIAGNOSIS — Z9981 Dependence on supplemental oxygen: Secondary | ICD-10-CM | POA: Diagnosis not present

## 2022-02-21 DIAGNOSIS — K209 Esophagitis, unspecified without bleeding: Secondary | ICD-10-CM | POA: Diagnosis present

## 2022-02-21 DIAGNOSIS — I10 Essential (primary) hypertension: Secondary | ICD-10-CM | POA: Diagnosis not present

## 2022-02-21 DIAGNOSIS — J9611 Chronic respiratory failure with hypoxia: Secondary | ICD-10-CM | POA: Insufficient documentation

## 2022-02-21 DIAGNOSIS — Z515 Encounter for palliative care: Secondary | ICD-10-CM | POA: Diagnosis not present

## 2022-02-21 DIAGNOSIS — J690 Pneumonitis due to inhalation of food and vomit: Secondary | ICD-10-CM | POA: Diagnosis present

## 2022-02-21 DIAGNOSIS — D649 Anemia, unspecified: Secondary | ICD-10-CM | POA: Insufficient documentation

## 2022-02-21 DIAGNOSIS — I700 Atherosclerosis of aorta: Secondary | ICD-10-CM | POA: Diagnosis present

## 2022-02-21 DIAGNOSIS — Z7189 Other specified counseling: Secondary | ICD-10-CM | POA: Diagnosis not present

## 2022-02-21 DIAGNOSIS — E43 Unspecified severe protein-calorie malnutrition: Secondary | ICD-10-CM | POA: Diagnosis present

## 2022-02-21 DIAGNOSIS — J869 Pyothorax without fistula: Secondary | ICD-10-CM | POA: Diagnosis present

## 2022-02-21 DIAGNOSIS — Z7901 Long term (current) use of anticoagulants: Secondary | ICD-10-CM | POA: Diagnosis not present

## 2022-02-21 DIAGNOSIS — T17908S Unspecified foreign body in respiratory tract, part unspecified causing other injury, sequela: Secondary | ICD-10-CM | POA: Diagnosis not present

## 2022-02-21 DIAGNOSIS — I5032 Chronic diastolic (congestive) heart failure: Secondary | ICD-10-CM | POA: Diagnosis present

## 2022-02-21 DIAGNOSIS — I110 Hypertensive heart disease with heart failure: Secondary | ICD-10-CM | POA: Diagnosis present

## 2022-02-21 DIAGNOSIS — F17210 Nicotine dependence, cigarettes, uncomplicated: Secondary | ICD-10-CM | POA: Diagnosis present

## 2022-02-21 DIAGNOSIS — T17908D Unspecified foreign body in respiratory tract, part unspecified causing other injury, subsequent encounter: Secondary | ICD-10-CM | POA: Diagnosis not present

## 2022-02-21 DIAGNOSIS — E876 Hypokalemia: Secondary | ICD-10-CM | POA: Diagnosis present

## 2022-02-21 DIAGNOSIS — J449 Chronic obstructive pulmonary disease, unspecified: Secondary | ICD-10-CM | POA: Diagnosis present

## 2022-02-21 DIAGNOSIS — Z66 Do not resuscitate: Secondary | ICD-10-CM | POA: Diagnosis present

## 2022-02-21 DIAGNOSIS — C3481 Malignant neoplasm of overlapping sites of right bronchus and lung: Secondary | ICD-10-CM | POA: Diagnosis present

## 2022-02-21 DIAGNOSIS — R911 Solitary pulmonary nodule: Secondary | ICD-10-CM | POA: Diagnosis not present

## 2022-02-21 DIAGNOSIS — I5031 Acute diastolic (congestive) heart failure: Secondary | ICD-10-CM

## 2022-02-21 DIAGNOSIS — J189 Pneumonia, unspecified organism: Secondary | ICD-10-CM | POA: Diagnosis not present

## 2022-02-21 DIAGNOSIS — K041 Necrosis of pulp: Secondary | ICD-10-CM | POA: Diagnosis present

## 2022-02-21 DIAGNOSIS — R64 Cachexia: Secondary | ICD-10-CM | POA: Diagnosis present

## 2022-02-21 DIAGNOSIS — R1310 Dysphagia, unspecified: Secondary | ICD-10-CM | POA: Diagnosis present

## 2022-02-21 DIAGNOSIS — Z6821 Body mass index (BMI) 21.0-21.9, adult: Secondary | ICD-10-CM

## 2022-02-21 DIAGNOSIS — F17200 Nicotine dependence, unspecified, uncomplicated: Secondary | ICD-10-CM | POA: Diagnosis not present

## 2022-02-21 LAB — VANCOMYCIN, RANDOM: Vancomycin Rm: 12 ug/mL

## 2022-02-21 LAB — VITAMIN B12: Vitamin B-12: 412 pg/mL (ref 180–914)

## 2022-02-21 LAB — FOLATE: Folate: 37.3 ng/mL (ref >5.9)

## 2022-02-21 LAB — URINALYSIS, ROUTINE W REFLEX MICROSCOPIC
Bilirubin Urine: NEGATIVE
Glucose, UA: NEGATIVE mg/dL
Hgb urine dipstick: NEGATIVE
Ketones, ur: NEGATIVE mg/dL
Leukocytes,Ua: NEGATIVE
Nitrite: NEGATIVE
Protein, ur: NEGATIVE mg/dL
Specific Gravity, Urine: 1.017 (ref 1.005–1.030)
pH: 5 (ref 5.0–8.0)

## 2022-02-21 LAB — TYPE AND SCREEN
ABO/RH(D): O POS
Antibody Screen: NEGATIVE

## 2022-02-21 LAB — COMPREHENSIVE METABOLIC PANEL
ALT: 10 U/L (ref 0–44)
AST: 12 U/L — ABNORMAL LOW (ref 15–41)
Albumin: 2.7 g/dL — ABNORMAL LOW (ref 3.5–5.0)
Alkaline Phosphatase: 54 U/L (ref 38–126)
Anion gap: 10 (ref 5–15)
BUN: 7 mg/dL — ABNORMAL LOW (ref 8–23)
CO2: 34 mmol/L — ABNORMAL HIGH (ref 22–32)
Calcium: 8.6 mg/dL — ABNORMAL LOW (ref 8.9–10.3)
Chloride: 99 mmol/L (ref 98–111)
Creatinine, Ser: 0.44 mg/dL — ABNORMAL LOW (ref 0.61–1.24)
GFR, Estimated: >60 mL/min (ref >60)
Glucose, Bld: 85 mg/dL (ref 70–99)
Potassium: 4.4 mmol/L (ref 3.5–5.1)
Sodium: 143 mmol/L (ref 135–145)
Total Bilirubin: 0.5 mg/dL (ref 0.3–1.2)
Total Protein: 5.9 g/dL — ABNORMAL LOW (ref 6.5–8.1)

## 2022-02-21 LAB — CBC
HCT: 32.9 % — ABNORMAL LOW (ref 39.0–52.0)
Hemoglobin: 9.4 g/dL — ABNORMAL LOW (ref 13.0–17.0)
MCH: 25 pg — ABNORMAL LOW (ref 26.0–34.0)
MCHC: 28.6 g/dL — ABNORMAL LOW (ref 30.0–36.0)
MCV: 87.5 fL (ref 80.0–100.0)
Platelets: 277 10*3/uL (ref 150–400)
RBC: 3.76 MIL/uL — ABNORMAL LOW (ref 4.22–5.81)
RDW: 18.2 % — ABNORMAL HIGH (ref 11.5–15.5)
WBC: 8.1 10*3/uL (ref 4.0–10.5)
nRBC: 0 % (ref 0.0–0.2)

## 2022-02-21 LAB — FERRITIN: Ferritin: 50 ng/mL (ref 24–336)

## 2022-02-21 LAB — RETICULOCYTES
Immature Retic Fract: 7.9 % (ref 2.3–15.9)
RBC.: 3.78 MIL/uL — ABNORMAL LOW (ref 4.22–5.81)
Retic Count, Absolute: 60.9 10*3/uL (ref 19.0–186.0)
Retic Ct Pct: 1.6 % (ref 0.4–3.1)

## 2022-02-21 LAB — TSH: TSH: 0.81 u[IU]/mL (ref 0.350–4.500)

## 2022-02-21 LAB — BASIC METABOLIC PANEL
Anion gap: 9 (ref 5–15)
BUN: <5 mg/dL — ABNORMAL LOW (ref 8–23)
CO2: 36 mmol/L — ABNORMAL HIGH (ref 22–32)
Calcium: 8 mg/dL — ABNORMAL LOW (ref 8.9–10.3)
Chloride: 100 mmol/L (ref 98–111)
Creatinine, Ser: 0.53 mg/dL — ABNORMAL LOW (ref 0.61–1.24)
GFR, Estimated: >60 mL/min (ref >60)
Glucose, Bld: 91 mg/dL (ref 70–99)
Potassium: 3.3 mmol/L — ABNORMAL LOW (ref 3.5–5.1)
Sodium: 145 mmol/L (ref 135–145)

## 2022-02-21 LAB — IRON AND TIBC
Iron: 26 ug/dL — ABNORMAL LOW (ref 45–182)
Saturation Ratios: 10 % — ABNORMAL LOW (ref 17.9–39.5)
TIBC: 267 ug/dL (ref 250–450)
UIBC: 241 ug/dL

## 2022-02-21 LAB — ECHOCARDIOGRAM COMPLETE
AR max vel: 3.75 cm2
AV Area VTI: 3.19 cm2
AV Area mean vel: 3.54 cm2
AV Mean grad: 2 mmHg
AV Peak grad: 4.7 mmHg
Ao pk vel: 1.08 m/s
Height: 68 in
S' Lateral: 3.2 cm
Weight: 2222.24 oz

## 2022-02-21 LAB — PROTIME-INR
INR: 1.2 (ref 0.8–1.2)
Prothrombin Time: 14.6 seconds (ref 11.4–15.2)

## 2022-02-21 LAB — APTT: aPTT: 38 seconds — ABNORMAL HIGH (ref 24–36)

## 2022-02-21 LAB — PREALBUMIN: Prealbumin: 17 mg/dL — ABNORMAL LOW (ref 18–38)

## 2022-02-21 LAB — VANCOMYCIN, PEAK
Vancomycin Pk: 13 ug/mL — ABNORMAL LOW (ref 30–40)
Vancomycin Pk: 33 ug/mL (ref 30–40)

## 2022-02-21 LAB — OCCULT BLOOD X 1 CARD TO LAB, STOOL: Fecal Occult Bld: POSITIVE — AB

## 2022-02-21 LAB — ABO/RH: ABO/RH(D): O POS

## 2022-02-21 LAB — BRAIN NATRIURETIC PEPTIDE: B Natriuretic Peptide: 227 pg/mL — ABNORMAL HIGH (ref 0.0–100.0)

## 2022-02-21 LAB — HIV ANTIBODY (ROUTINE TESTING W REFLEX): HIV Screen 4th Generation wRfx: NONREACTIVE

## 2022-02-21 LAB — LACTIC ACID, PLASMA: Lactic Acid, Venous: 1.4 mmol/L (ref 0.5–1.9)

## 2022-02-21 MED ORDER — PIPERACILLIN-TAZOBACTAM 3.375 G IVPB
3.3750 g | Freq: Three times a day (TID) | INTRAVENOUS | Status: DC
  Administered 2022-02-21 – 2022-02-22 (×4): 3.375 g via INTRAVENOUS
  Filled 2022-02-21 (×4): qty 50

## 2022-02-21 MED ORDER — VANCOMYCIN HCL 1250 MG/250ML IV SOLN
1250.0000 mg | Freq: Once | INTRAVENOUS | Status: AC
  Administered 2022-02-21: 1250 mg via INTRAVENOUS
  Filled 2022-02-21: qty 250

## 2022-02-21 MED ORDER — ACETAMINOPHEN 650 MG RE SUPP: 650.0000 mg | Freq: Four times a day (QID) | RECTAL | Status: DC | PRN

## 2022-02-21 MED ORDER — OXYCODONE HCL 5 MG PO TABS: 5.0000 mg | ORAL_TABLET | ORAL | Status: DC | PRN

## 2022-02-21 MED ORDER — SPIRONOLACTONE 25 MG PO TABS
25.0000 mg | ORAL_TABLET | Freq: Every day | ORAL | Status: DC
  Administered 2022-02-21: 25 mg via ORAL
  Filled 2022-02-21: qty 1

## 2022-02-21 MED ORDER — CARVEDILOL 12.5 MG PO TABS: 12.5000 mg | ORAL_TABLET | Freq: Two times a day (BID) | ORAL | Status: DC

## 2022-02-21 MED ORDER — DIGOXIN 125 MCG PO TABS
125.0000 ug | ORAL_TABLET | Freq: Every day | ORAL | Status: DC
  Administered 2022-02-21 – 2022-02-28 (×7): 125 ug via ORAL
  Filled 2022-02-21 (×8): qty 1

## 2022-02-21 MED ORDER — ACETAMINOPHEN 325 MG PO TABS
650.0000 mg | ORAL_TABLET | Freq: Four times a day (QID) | ORAL | Status: DC | PRN
  Filled 2022-02-21: qty 2

## 2022-02-21 MED ORDER — SPIRONOLACTONE 25 MG PO TABS: 25.0000 mg | ORAL_TABLET | Freq: Every day | ORAL | Status: DC

## 2022-02-21 MED ORDER — FUROSEMIDE 20 MG PO TABS: 20.0000 mg | ORAL_TABLET | Freq: Every day | ORAL | Status: DC

## 2022-02-21 MED ORDER — ALBUTEROL SULFATE (2.5 MG/3ML) 0.083% IN NEBU: 2.5000 mg | INHALATION_SOLUTION | RESPIRATORY_TRACT | Status: DC | PRN

## 2022-02-21 MED ORDER — VANCOMYCIN HCL IN DEXTROSE 1-5 GM/200ML-% IV SOLN
1000.0000 mg | Freq: Two times a day (BID) | INTRAVENOUS | Status: DC
  Administered 2022-02-21: 1000 mg via INTRAVENOUS
  Filled 2022-02-21 (×3): qty 200

## 2022-02-21 MED ORDER — POTASSIUM CHLORIDE CRYS ER 20 MEQ PO TBCR
40.0000 meq | EXTENDED_RELEASE_TABLET | Freq: Once | ORAL | Status: AC
  Administered 2022-02-21: 40 meq via ORAL
  Filled 2022-02-21: qty 2

## 2022-02-21 MED ORDER — PANTOPRAZOLE SODIUM 40 MG PO TBEC
40.0000 mg | DELAYED_RELEASE_TABLET | Freq: Every day | ORAL | Status: DC
  Administered 2022-02-21 – 2022-02-28 (×8): 40 mg via ORAL
  Filled 2022-02-21 (×8): qty 1

## 2022-02-21 MED ORDER — CARVEDILOL 3.125 MG PO TABS
3.1250 mg | ORAL_TABLET | Freq: Two times a day (BID) | ORAL | Status: DC
  Administered 2022-02-21 – 2022-02-28 (×12): 3.125 mg via ORAL
  Filled 2022-02-21 (×15): qty 1

## 2022-02-21 MED ORDER — ENSURE ENLIVE PO LIQD
237.0000 mL | Freq: Three times a day (TID) | ORAL | Status: DC
  Administered 2022-02-21 – 2022-02-28 (×19): 237 mL via ORAL
  Filled 2022-02-21: qty 237

## 2022-02-21 NOTE — Consult Note (Signed)
[address]            [place] [address]          [phone]       Jolan [place] Medical Record #[id number] Date of Birth: [birth year]  Caryl Bis, MD System, Provider Not In  Chief Complaint:   No chief complaint on file. Cough, shortness of breath, weight loss  History of Present Illness:     Patient examined, images of CT scan performed at [facility] prior to his transfer here personally reviewed and discussed with patient.  Patient has been ill with pulmonary symptoms for several weeks and has been hospitalized in both [place] and [facility] in the past several weeks.  He was transferred here after a CT scan raise concern over an empyema of the left pleural space.  Patient has had problems with aspiration pneumonia.  He has extremely poor dental hygiene and necrotic teeth.  He has a heavy smoking history but stopped earlier this year.  He is on home oxygen and has poor exercise tolerance with dyspnea on exertion.  He has had significant weight loss and is very debilitated as well.  He denies hemoptysis but does cough up some clear foamy material.  CT scan shows some left lower lobe parenchymal consolidative densities consistent with chronic aspiration-fibrosis.  There is no significant pleural collection or empyema.  There is no indication for drainage or VATS surgery.  The right upper lobe has a 2 and half centimeter spiculated density suspicious for bronchogenic carcinoma.  Patient needs bronchoscopy for examination of the left lower lobe process with cultures and cytology as well as navigational biopsy of the right upper lobe mass.  Patient does not need open surgery and would not be a candidate for thoracotomy if found to have bronchogenic carcinoma.  Recommend consultation with pulmonary medicine for bronchoscopy and biopsies.   Current Activity/ Functional Status: Zubrod Score: At the time of  surgery this patient's most appropriate activity status/level should be described as: []     0    Normal activity, no symptoms []     1    Restricted in physical strenuous activity but ambulatory, able to do out light work []     2    Ambulatory and capable of self care, unable to do work activities, up and about >50 % of waking hours                              []     3    Only limited self care, in bed greater than 50% of waking hours [x]     4    Completely disabled, no self care, confined to bed or chair []     5    Moribund    Past Medical History:  Diagnosis Date   CHF (congestive heart failure) (HCC)    COPD (chronic obstructive pulmonary disease) ([place])    Coronary artery disease    Hypertension     Past Surgical History:  Procedure Laterality Date   BIOPSY  01/16/2020   Procedure: BIOPSY;  Surgeon: Harvel Quale, MD;  [facility];  Service: Gastroenterology;;  esophageal biopsies @25 ,37,20   CHOLECYSTECTOMY Bilateral 01/21/2020   Procedure: LAPAROSCOPIC CHOLECYSTECTOMY;  Surgeon: Greer Pickerel, MD;  Location: [place];  Service: General;  Laterality: Bilateral;   ENDOSCOPIC RETROGRADE CHOLANGIOPANCREATOGRAPHY (ERCP) WITH PROPOFOL N/A 01/20/2020   Procedure: ENDOSCOPIC RETROGRADE CHOLANGIOPANCREATOGRAPHY (ERCP) WITH PROPOFOL;  Surgeon: Rush Landmark Telford Nab., MD;  Location: [place];  Service: Gastroenterology;  Laterality: N/A;   ESOPHAGOGASTRODUODENOSCOPY (EGD) WITH PROPOFOL N/A 01/16/2020   Procedure: ESOPHAGOGASTRODUODENOSCOPY (EGD) WITH PROPOFOL;  Surgeon: Harvel Quale, MD;  [facility];  Service: Gastroenterology;  Laterality: N/A;   ESOPHAGOGASTRODUODENOSCOPY (EGD) WITH PROPOFOL N/A 01/20/2020   Procedure: ESOPHAGOGASTRODUODENOSCOPY (EGD) WITH PROPOFOL;  Surgeon: Rush Landmark Telford Nab., MD;  Location: [place];  Service: Gastroenterology;  Laterality: N/A;   HEMOSTASIS CLIP PLACEMENT  01/20/2020   Procedure: HEMOSTASIS CLIP  PLACEMENT;  Surgeon: Irving Copas., MD;  Location: [place];  Service: Gastroenterology;;   HOT HEMOSTASIS N/A 01/20/2020   Procedure: HOT HEMOSTASIS (ARGON PLASMA COAGULATION/BICAP);  Surgeon: Irving Copas., MD;  Location: [place];  Service: Gastroenterology;  Laterality: N/A;   REMOVAL OF STONES  01/20/2020   Procedure: REMOVAL OF STONES;  Surgeon: Mansouraty, Telford Nab., MD;  Location: [facility];  Service: Gastroenterology;;   Lavell Islam REMOVAL  01/20/2020   Procedure: STENT REMOVAL;  Surgeon: Irving Copas., MD;  Location: [facility] ENDOSCOPY;  Service: Gastroenterology;;    Social History   Tobacco Use  Smoking Status Every Day   Types: Cigarettes  Smokeless Tobacco Never    Social History   Substance and Sexual Activity  Alcohol Use No    Social History   Socioeconomic History   Marital status: Single    Spouse name: Not on file   Number of children: Not on file   Years of education: Not on file   Highest education level: Not on file  Occupational History   Not on file  Tobacco Use   Smoking status: Every Day    Types: Cigarettes   Smokeless tobacco: Never  Vaping Use   Vaping Use: Never used  Substance and Sexual Activity   Alcohol use: No   Drug use: Never   Sexual activity: Not Currently  Other Topics Concern   Not on file  Social History Narrative   Not on file   Social Determinants of Health   Financial Resource Strain: Not on file  Food Insecurity: Not on file  Transportation Needs: Not on file  Physical Activity: Not on file  Stress: Not on file  Social Connections: Not on file  Intimate Partner Violence: Not on file    No Known Allergies  Current Facility-Administered Medications  Medication Dose Route Frequency Provider Last Rate Last Admin   acetaminophen (TYLENOL) tablet 650 mg  650 mg Oral Q6H PRN Shela Leff, MD       Or   acetaminophen (TYLENOL) suppository 650 mg  650 mg Rectal Q6H PRN Shela Leff, MD       carvedilol (COREG) tablet 3.125 mg  3.125 mg Oral BID Charlynne Cousins, MD   3.125 mg at 02/21/22 1145   digoxin (LANOXIN) tablet 125 mcg  125 mcg Oral Daily Charlynne Cousins, MD       feeding supplement (ENSURE ENLIVE / ENSURE PLUS) liquid 237 mL  237 mL Oral TID WC Dahlia Byes, MD       oxyCODONE (Oxy IR/ROXICODONE) immediate release tablet 5 mg  5 mg Oral Q4H PRN Charlynne Cousins, MD       pantoprazole (PROTONIX) EC tablet 40 mg  40 mg Oral Daily Charlynne Cousins, MD   40 mg at 02/21/22 0953   piperacillin-tazobactam (ZOSYN) IVPB  3.375 g  3.375 g Intravenous Q8H Laren Everts, RPH 12.5 mL/hr at 02/21/22 0737 3.375 g at 02/21/22 0737   spironolactone (ALDACTONE) tablet 25 mg  25 mg Oral Daily Charlynne Cousins, MD   25 mg at 02/21/22 0960     History reviewed. No pertinent family history.   Review of Systems:     Cardiac Review of Systems: Y or N  Chest Pain [    ]  Resting SOB [x   ] Exertional SOB  [ x ]  Orthopnea [  ]   Pedal Edema [   ]    Palpitations [  ] Syncope  [  ]   Presyncope [   ]  General Review of Systems: [Y] = yes [  ]=no Constitional: recent weight change [ x ]; anorexia [  ]; fatigue [ x ]; nausea [  ]; night sweats [  ]; fever [  ]; or chills [  ];                                                                                                                                          Dental: poor dentition[ x ]; Last Dentist visit: Unknown  Eye : blurred vision [  ]; diplopia [   ]; vision changes [  ];  Amaurosis fugax[  ]; Resp: cough [  ];  wheezing[  ];  hemoptysis[  ]; shortness of breath[  ]; paroxysmal nocturnal dyspnea[  ]; dyspnea on exertion[  ]; or orthopnea[  ];  GI:  gallstones[  ], vomiting[  ];  dysphagia[  ]; melena[  ];  hematochezia [  ]; heartburn[  ];   Hx of  Colonoscopy[  ]; GU: kidney stones [  ]; hematuria[  ];   dysuria [  ];  nocturia[  ];  history of     obstruction [  ];                 Skin:  rash, swelling[  ];, hair loss[  ];  peripheral edema[  ];  or itching[  ]; Musculosketetal: myalgias[  ];  joint swelling[  ];  joint erythema[  ];  joint pain[  ];  back pain[  ];  Heme/Lymph: bruising[  ];  bleeding[  ];  anemia[  ];  Neuro: TIA[  ];  headaches[  ];  stroke[  ];  vertigo[  ];  seizures[  ];   paresthesias[  ];  difficulty walking[  ];  Psych:depression[  ]; anxiety[  ];  Endocrine: diabetes[  ];  thyroid dysfunction[  ];  Immunizations: Flu [  ]; Pneumococcal[  ];  Other:  Physical Exam: BP 128/71 (BP Location: Right Arm)   Pulse 77   Temp 98 F (36.7 C) (Oral)   Resp 18   Ht 5\' 8"  (1.727 m)   Wt 63 kg  SpO2 100%   BMI 21.12 kg/m       Physical Exam  General: Fragile poorly nourished chronically ill-appearing male on oxygen HEENT: Normocephalic pupils equal , dentition with several necrotic teeth Neck: Supple without JVD, adenopathy, or bruit Chest: Tubular breath sounds with rhonchi at the left base, scattered rhonchi in right lung  Cardiovascular: Regular rate and rhythm, no murmur, no gallop, peripheral pulses             palpable in all extremities Abdomen:  Soft, scaphoid, nontender, no palpable mass or organomegaly Extremities: Warm, well-perfused, 2+ clubbing  no cyanosis edema or tenderness,              no venous stasis changes of the legs Rectal/GU: Deferred Neuro: Grossly non--focal and symmetrical throughout Skin: Clean and dry without rash or ulceration    Diagnostic Studies & Laboratory data:     Recent Radiology Findings:   DG Chest 2 View  Result Date: 02/21/2022 CLINICAL DATA:  Pneumonia and shortness of breath EXAM: CHEST - 2 VIEW COMPARISON:  Chest radiograph dated 01/16/2020, CTA chest dated 02/17/2022 FINDINGS: Normal lung volumes. Left lingular and lower lobe confluent and right basilar patchy opacities are again seen. Spiculated right upper lobe mass is better evaluated on prior CT. Left-greater-than-right small bilateral  pleural effusions. No pneumothorax. The heart size and mediastinal contours are within normal limits. The visualized skeletal structures are unremarkable. IMPRESSION: 1. Left lingular and lower lobe confluent and right basilar patchy opacities are again seen, suspicious for pneumonia. 2. Spiculated right upper lobe mass is better evaluated on prior CT. 3. Small bilateral pleural effusions. Electronically Signed   By: Darrin Nipper M.D.   On: 02/21/2022 12:20      Recent Lab Findings: Lab Results  Component Value Date   WBC 8.1 02/21/2022   HGB 9.4 (L) 02/21/2022   HCT 32.9 (L) 02/21/2022   PLT 277 02/21/2022   GLUCOSE 91 02/21/2022   ALT 38 01/23/2020   AST 37 01/23/2020   NA 145 02/21/2022   K 3.3 (L) 02/21/2022   CL 100 02/21/2022   CREATININE 0.53 (L) 02/21/2022   BUN <5 (L) 02/21/2022   CO2 36 (H) 02/21/2022   TSH 1.072 01/16/2020   INR 1.2 02/21/2022      Assessment / Plan:   Patient does not have empyema but has a chronic appearing left lower lobe parenchymal consolidation from probable aspiration pneumonia. Right upper lobe medial spiculated mass concerning for bronchogenic carcinoma Weight loss, COPD with poor functional status, home oxygen 3 L.  Recommend consult to pulmonary medicine for bronchoscopy.  Patient not a candidate for VATS.

## 2022-02-21 NOTE — Progress Notes (Signed)
Pharmacy Antibiotic Note  Kenneth Coleman is a 66 y.o. male admitted on 02/21/2022 with  empyema .  Pharmacy has been consulted for vancomycin and Zosyn dosing.    Previously received vancomycin 1250mg  Q12H from 11/11-11/14 at OSH with last dose 11/154 @1431 .    Vanc levels drawn today - calculated AUC 656 on vancomycin 1250mg  Q12H which is above goal (400-550). Renal function appears stable.  Plan: Decrease to vancomycin 1000mg  Q12H (eAUC 535) - compared to 750mg  Q12H (eAUC 393) Continue Zosyn 3.375g Q8H  Height: 5\' 8"  (172.7 cm) Weight: 63 kg (138 lb 14.2 oz) IBW/kg (Calculated) : 68.4  Temp (24hrs), Avg:98 F (36.7 C), Min:97.8 F (36.6 C), Max:98.2 F (36.8 C)   No Known Allergies   Thank you for allowing pharmacy to be a part of this patient's care.  Erskine Speed, PharmD 02/21/2022 1:39 PM

## 2022-02-21 NOTE — Care Management (Signed)
  Transition of Care [facility]) Screening Note   Patient Details  Name: Kenneth Coleman Date of Birth: 02/23/1956   Transition of Care [facility]) CM/SW Contact:    Carles Collet, RN Phone Number: 02/21/2022, 8:36 AM    Transition of Care Department [facility]) has reviewed patient and no e will continue to monitor patient advancement through interdisciplinary progression rounds.  From home, active w [facility], will need resumption orders placed, has home oxygen 3L baseline

## 2022-02-21 NOTE — Progress Notes (Addendum)
[facility] PROGRESS NOTE    Progress Note  Kenneth Coleman  TZG:[id number] DOB: [birth year] DOA: 02/21/2022 PCP: System, Provider Not In     Brief Narrative:   Kenneth Coleman is an 66 y.o. male past medical history significant for chronic diastolic heart failure, COPD/chronic hypoxic respiratory failure on 2 L of oxygen at home, essential hypertension, recurrent pneumonias and pleural effusion requiring chest tube placement crying hospitalization comes in with worsening shortness of breath white count of 7 hemoglobin of 10    Assessment/Plan:   Complicated pleural effusion/empyema (HCC)/aspiration pneumonia: CTA at [facility] showed extensive endobronchial debris, with multiloculated left basilar pleural effusion associated with pleural thickening. Started empirically on IV vancomycin and Zosyn. Blood cultures have been sent. CT surgery has been notified for chest tube placement. She has remained afebrile  Incidental 2.2 cm spiculated mass within the right upper lobe: Concern about bronchogenic neoplasm, previous physician spoke with the patient will need a biopsy as an outpatient.  Normocytic anemia: No signs of overt bleeding. Anemia panel showed ferritin of 50 saturation 10%. Will need iron supplementation.  Chronic diastolic heart failure: Appears euvolemic on physical exam.  COPD/chronic respiratory failure: Satting greater 90% on room air keep her on 2 to 3 L of oxygen.  CAD: Noted denies any symptoms.  Essential hypertension: Resume Coreg at a lower, resume Aldactone hold Lasix.  Hypokalemia: Replete orally recheck in the morning.    DVT prophylaxis: lovenox Family Communication:none Status is: Inpatient Remains inpatient appropriate because: Empyema    Code Status:     Code Status Orders  (From admission, onward)           Start     Ordered   02/21/22 0348  Full code  Continuous        02/21/22 0354           Code Status  History     Date Active Date Inactive Code Status Order ID Comments User Context   01/15/2020 1926 01/25/2020 2230 Full Code [id number]  [name], DO ED         IV Access:   Peripheral IV   Procedures and diagnostic studies:   No results found.   Medical Consultants:   None.   Subjective:    Kenneth Coleman  relates his breathing is not improved  Objective:    Vitals:   02/21/22 0300 02/21/22 0400 02/21/22 0618  BP: 106/71  109/75  Pulse: 75  88  Resp:   18  Temp: 98 F (36.7 C)  98.2 F (36.8 C)  TempSrc: Oral  Oral  SpO2: 100%  100%  Weight:  63 kg   Height:  5\' 8"  (1.727 m)    SpO2: 100 % O2 Flow Rate (L/min): 3 L/min  No intake or output data in the 24 hours ending 02/21/22 0717 Filed Weights   02/21/22 0400  Weight: 63 kg    Exam: General exam: In no acute distress. Respiratory system: Good air movement, with decreased breath sounds on the left Cardiovascular system: S1 & S2 heard, RRR. No JVD. Gastrointestinal system: Abdomen is nondistended, soft and nontender.  Extremities: No pedal edema. Skin: No rashes, lesions or ulcers Psychiatry: Judgement and insight appear normal. Mood & affect appropriate.    Data Reviewed:    Labs: Basic Metabolic Panel: Recent Labs  Lab 02/21/22 0435  NA 145  K 3.3*  CL 100  CO2 36*  GLUCOSE 91  BUN <5*  CREATININE 0.53*  CALCIUM 8.0*  GFR Estimated Creatinine Clearance: 79.8 mL/min (A) (by C-G formula based on SCr of 0.53 mg/dL (L)). Liver Function Tests: No results for input(s): "AST", "ALT", "ALKPHOS", "BILITOT", "PROT", "ALBUMIN" in the last 168 hours. No results for input(s): "LIPASE", "AMYLASE" in the last 168 hours. No results for input(s): "AMMONIA" in the last 168 hours. Coagulation profile No results for input(s): "INR", "PROTIME" in the last 168 hours. COVID-19 Labs  Recent Labs    02/21/22 0435  FERRITIN 50    Lab Results  Component Value Date   SARSCOV2NAA  NEGATIVE 01/15/2020    CBC: Recent Labs  Lab 02/21/22 0442  WBC 8.1  HGB 9.4*  HCT 32.9*  MCV 87.5  PLT 277   Cardiac Enzymes: No results for input(s): "CKTOTAL", "CKMB", "CKMBINDEX", "TROPONINI" in the last 168 hours. BNP (last 3 results) No results for input(s): "PROBNP" in the last 8760 hours. CBG: No results for input(s): "GLUCAP" in the last 168 hours. D-Dimer: No results for input(s): "DDIMER" in the last 72 hours. Hgb A1c: No results for input(s): "HGBA1C" in the last 72 hours. Lipid Profile: No results for input(s): "CHOL", "HDL", "LDLCALC", "TRIG", "CHOLHDL", "LDLDIRECT" in the last 72 hours. Thyroid function studies: No results for input(s): "TSH", "T4TOTAL", "T3FREE", "THYROIDAB" in the last 72 hours.  Invalid input(s): "FREET3" Anemia work up: Recent Labs    02/21/22 0435 02/21/22 0442  VITAMINB12 412  --   FOLATE 37.3  --   FERRITIN 50  --   TIBC 267  --   IRON 26*  --   RETICCTPCT  --  1.6   Sepsis Labs: Recent Labs  Lab 02/21/22 0442  WBC 8.1  LATICACIDVEN 1.4   Microbiology No results found for this or any previous visit (from the past 240 hour(s)).   Medications:    Continuous Infusions:  piperacillin-tazobactam (ZOSYN)  IV     vancomycin        LOS: 0 days   Charlynne Cousins  [facility]  02/21/2022, 7:17 AM

## 2022-02-21 NOTE — Progress Notes (Signed)
  Echocardiogram 2D Echocardiogram has been performed.  Kenneth Coleman 02/21/2022, 4:21 PM

## 2022-02-21 NOTE — H&P (Signed)
History and Physical    Kenneth Coleman VOJ:[id number] DOB: 01/30/1956 DOA: 02/21/2022  PCP: System, Provider Not In  Patient coming from:  [facility] ED  Chief Complaint: Shortness of breath  HPI: Kenneth Coleman is a 66 y.o. male with medical history significant of chronic HFpEF, COPD, chronic hypoxemic respiratory failure on 2-3 L O2, CAD, hypertension presented to [facility] ED on 11/11 with worsening shortness of breath and cough.  Recent complicated medical history with recurrent pneumonia, pleural effusion requiring chest tube placement, and multiple hospitalizations at [facility].  Labs reviewed WBC count 7.7, lactic acid 0.9, hemoglobin 9.8 with MCV 88.2, pro-BNP 1012, bicarb 41, COVID/influenza/RSV PCR negative.    CTA chest showing: "1. No pulmonary embolism.  2. Morphologic changes in keeping with chronically elevated  pulmonary arterial and right heart pressures.  3. Interval development of a 2.2 cm spiculated mass within the  medial right upper lobe suspicious for a primary bronchogenic  neoplasm.  4. Extensive endobronchial debris within the dependent trachea and  lower lobar pulmonary bronchi with peribronchial consolidation  suspicious for changes of aspiration.  5. Multiloculated left basilar pleural effusion with associated  pleural thickening suggesting a complex effusion such as an empyema,  hemothorax, or inflammatory effusion.  6. Mild emphysema.   Aortic Atherosclerosis (ICD10-I70.0) and Emphysema (ICD10-J43.9)."  Patient was treated with IV vancomycin, Zosyn, and IV fluid hydration.  Transferred to [facility].  Patient states he has been hospitalized at [facility] 3 or 4 times in the past few months for the same problem including pneumonia, fluid in his lungs, and was told that he also has a lung mass on the right.  States during one of these hospitalizations he required chest tube placement on the left.  He was discharged from the hospital 1 week  ago and returned to the ED due to worsening shortness of breath and cough.  He denies fevers or chest pain.  Reports longstanding history of cigarette smoking.  Uses 3 L oxygen at home 24/7.  Denies any episodes of vomiting.  No other complaints.  Review of Systems:  Review of Systems  All other systems reviewed and are negative.   Past Medical History:  Diagnosis Date   CHF (congestive heart failure) (HCC)    COPD (chronic obstructive pulmonary disease) ([place])    Coronary artery disease    Hypertension     Past Surgical History:  Procedure Laterality Date   BIOPSY  01/16/2020   Procedure: BIOPSY;  Surgeon: Harvel Quale, MD;  [facility];  Service: Gastroenterology;;  esophageal biopsies @25 ,37,20   CHOLECYSTECTOMY Bilateral 01/21/2020   Procedure: LAPAROSCOPIC CHOLECYSTECTOMY;  Surgeon: Greer Pickerel, MD;  Location: [place];  Service: General;  Laterality: Bilateral;   ENDOSCOPIC RETROGRADE CHOLANGIOPANCREATOGRAPHY (ERCP) WITH PROPOFOL N/A 01/20/2020   Procedure: ENDOSCOPIC RETROGRADE CHOLANGIOPANCREATOGRAPHY (ERCP) WITH PROPOFOL;  Surgeon: Irving Copas., MD;  Location: [place];  Service: Gastroenterology;  Laterality: N/A;   ESOPHAGOGASTRODUODENOSCOPY (EGD) WITH PROPOFOL N/A 01/16/2020   Procedure: ESOPHAGOGASTRODUODENOSCOPY (EGD) WITH PROPOFOL;  Surgeon: Harvel Quale, MD;  [facility];  Service: Gastroenterology;  Laterality: N/A;   ESOPHAGOGASTRODUODENOSCOPY (EGD) WITH PROPOFOL N/A 01/20/2020   Procedure: ESOPHAGOGASTRODUODENOSCOPY (EGD) WITH PROPOFOL;  Surgeon: Rush Landmark Telford Nab., MD;  Location: [place];  Service: Gastroenterology;  Laterality: N/A;   HEMOSTASIS CLIP PLACEMENT  01/20/2020   Procedure: HEMOSTASIS CLIP PLACEMENT;  Surgeon: Irving Copas., MD;  Location: [place];  Service: Gastroenterology;;   HOT HEMOSTASIS N/A 01/20/2020   Procedure:  HOT HEMOSTASIS (ARGON PLASMA COAGULATION/BICAP);   Surgeon: Irving Copas., MD;  Location: [place];  Service: Gastroenterology;  Laterality: N/A;   REMOVAL OF STONES  01/20/2020   Procedure: REMOVAL OF STONES;  Surgeon: Rush Landmark Telford Nab., MD;  Location: [place];  Service: Gastroenterology;;   Lavell Islam REMOVAL  01/20/2020   Procedure: STENT REMOVAL;  Surgeon: Irving Copas., MD;  Location: [place];  Service: Gastroenterology;;     reports that he has been smoking cigarettes. He has never used smokeless tobacco. He reports that he does not drink alcohol and does not use drugs.  No Known Allergies  History reviewed. No pertinent family history.  Prior to Admission medications   Medication Sig Start Date End Date Taking? Authorizing Provider  ASPIRIN LOW DOSE 81 MG EC tablet Take 81 mg by mouth daily. 12/22/19   [provider]  BEVESPI AEROSPHERE 9-4.8 MCG/ACT AERO Take 2 puffs by mouth daily.  12/22/19   [provider]  carvedilol (COREG) 12.5 MG tablet Take 12.5 mg by mouth 2 (two) times daily. 11/18/19   [provider]  cholecalciferol (VITAMIN D) 25 MCG (1000 UNIT) tablet Take 1,000 Units by mouth daily.    [provider]  digoxin (LANOXIN) 0.125 MG tablet Take 125 mcg by mouth daily. 10/22/19   [provider]  docusate sodium (COLACE) 100 MG capsule Take 1 capsule (100 mg total) by mouth daily. 01/25/20   Mariel Aloe, MD  feeding supplement (ENSURE ENLIVE / ENSURE PLUS) LIQD Take 237 mLs by mouth 3 (three) times daily between meals. 01/25/20   Mariel Aloe, MD  furosemide (LASIX) 20 MG tablet Take 20 mg by mouth daily. 12/22/19   [provider]  Multiple Vitamin (MULTIVITAMIN WITH MINERALS) TABS tablet Take 1 tablet by mouth daily. 01/25/20   Mariel Aloe, MD  oxyCODONE (OXY IR/ROXICODONE) 5 MG immediate release tablet Take 1 tablet (5 mg total) by mouth every 4 (four) hours as needed for moderate pain or severe pain. 01/22/20   Saverio Danker, PA-C  pantoprazole (PROTONIX) 40 MG tablet Take 1 tablet (40 mg total) by mouth 2 (two) times daily before a meal. 01/25/20 03/21/20  Mariel Aloe, MD  spironolactone (ALDACTONE) 25 MG tablet Take 25 mg by mouth daily. 12/22/19   [provider]    Physical Exam: Vitals:   02/21/22 0300  BP: 106/71  Pulse: 75  Temp: 98 F (36.7 C)  TempSrc: Oral  SpO2: 100%    Physical Exam Vitals reviewed.  Constitutional:      General: He is not in acute distress. HENT:     Head: Normocephalic and atraumatic.  Eyes:     Extraocular Movements: Extraocular movements intact.  Cardiovascular:     Rate and Rhythm: Normal rate and regular rhythm.     Pulses: Normal pulses.  Pulmonary:     Effort: Pulmonary effort is normal. No respiratory distress.     Breath sounds: No wheezing or rales.  Abdominal:     General: Bowel sounds are normal. There is no distension.     Palpations: Abdomen is soft.     Tenderness: There is no abdominal tenderness.  Musculoskeletal:     Cervical back: Normal range of motion.     Right lower leg: No edema.     Left lower leg: No edema.  Skin:    General: Skin is warm and dry.  Neurological:     General: No focal deficit present.  Mental Status: He is alert and oriented to person, place, and time.     Labs on Admission: I have personally reviewed following labs and imaging studies  CBC: No results for input(s): "WBC", "NEUTROABS", "HGB", "HCT", "MCV", "PLT" in the last 168 hours. Basic Metabolic Panel: No results for input(s): "NA", "K", "CL", "CO2", "GLUCOSE", "BUN", "CREATININE", "CALCIUM", "MG", "PHOS" in the last 168 hours. GFR: CrCl cannot be calculated (Patient's most recent lab result is older than the maximum 21 days allowed.). Liver Function Tests: No results for input(s): "AST", "ALT", "ALKPHOS", "BILITOT", "PROT", "ALBUMIN" in the last 168 hours. No results for input(s): "LIPASE", "AMYLASE" in the last 168 hours. No results  for input(s): "AMMONIA" in the last 168 hours. Coagulation Profile: No results for input(s): "INR", "PROTIME" in the last 168 hours. Cardiac Enzymes: No results for input(s): "CKTOTAL", "CKMB", "CKMBINDEX", "TROPONINI" in the last 168 hours. BNP (last 3 results) No results for input(s): "PROBNP" in the last 8760 hours. HbA1C: No results for input(s): "HGBA1C" in the last 72 hours. CBG: No results for input(s): "GLUCAP" in the last 168 hours. Lipid Profile: No results for input(s): "CHOL", "HDL", "LDLCALC", "TRIG", "CHOLHDL", "LDLDIRECT" in the last 72 hours. Thyroid Function Tests: No results for input(s): "TSH", "T4TOTAL", "FREET4", "T3FREE", "THYROIDAB" in the last 72 hours. Anemia Panel: No results for input(s): "VITAMINB12", "FOLATE", "FERRITIN", "TIBC", "IRON", "RETICCTPCT" in the last 72 hours. Urine analysis:    Component Value Date/Time   COLORURINE AMBER (A) 01/15/2020 1355   APPEARANCEUR CLEAR 01/15/2020 1355   LABSPEC 1.016 01/15/2020 1355   PHURINE 5.0 01/15/2020 1355   GLUCOSEU NEGATIVE 01/15/2020 1355   HGBUR NEGATIVE 01/15/2020 [place] 01/15/2020 1355   KETONESUR NEGATIVE 01/15/2020 1355   PROTEINUR NEGATIVE 01/15/2020 1355   NITRITE NEGATIVE 01/15/2020 [place] 01/15/2020 1355    Radiological Exams on Admission: No results found.  EKG: Ordered and currently pending.  Assessment and Plan  Aspiration pneumonia Complicated pleural effusion/possible empyema Chronic hypoxemic respiratory failure CT done at [facility] showing extensive endobronchial debris within the dependent trachea and lower lobar pulmonary bronchi with peribronchial consolidation suspicious for changes of aspiration.  Also showing multiloculated left basilar pleural effusion with associated pleural thickening suggesting a complex effusion such as an empyema, hemothorax, or inflammatory effusion.  Labs done at outside facility showing no leukocytosis  or lactic acidosis and COVID/influenza/RSV PCR negative.  Currently stable on 3 L O2 which is his baseline requirement.  No fever or tachycardia. -Continue vancomycin and Zosyn -Blood cultures -Repeat CBC, lactate -Will need drainage of pleural effusion.  Please consult CT surgery in a.m. -Continue supplemental oxygen  Incidental lung mass CT done at [facility] showing interval development of a 2.2 cm spiculated mass within the medial right upper lobe suspicious for a primary bronchogenic neoplasm.  Discussed with the patient and he informed me that he is already aware of this finding. -Will need biopsy and oncology evaluation.  Normocytic anemia Not endorsing any symptoms of GI bleed. -Repeat CBC -Anemia panel -FOBT  Chronic HFpEF proBNP elevated on labs done at [facility]. -Repeat BNP -Echocardiogram  COPD: No wheezing. CAD: Not endorsing any chest pain. Hypertension -Pharmacy med rec pending.  DVT prophylaxis: SCDs Code Status: Full Code (discussed with the patient) Family Communication: No family available at this time. Level of care: Med-Surg Admission status: It is my clinical opinion that admission to INPATIENT is reasonable and necessary because of the expectation that this patient will require  hospital care that crosses at least 2 midnights to treat this condition based on the medical complexity of the problems presented.  Given the aforementioned information, the predictability of an adverse outcome is felt to be significant.   Shela Leff MD [facility]  If 7PM-7AM, please contact night-coverage [email or web address]  02/21/2022, 3:09 AM

## 2022-02-21 NOTE — Progress Notes (Signed)
Pharmacy Antibiotic Note  Kenneth Coleman is a 66 y.o. male admitted on 02/21/2022 with  empyema .  Pharmacy has been consulted for vancomycin and Zosyn dosing.  Pt has been awaiting [facility] bed at [facility] since 11/11; has been receiving vancomycin 1250mg  IV Q12H based on troughs (was ~14-15 per care everywhere note on 11/14 am) as well as extended-infusion Zosyn.  AUC dosing suggests that vanc dose should be lower at 750mg  Q12H (for calculated AUC of 470).  Plan: Will give one more vanc dose of 1250mg  and perform kinetics calculations with levels, adjust as necessary. Continue Zosyn 3.375g IV q8h (4 hour infusion).  Height: 5\' 8"  (172.7 cm) Weight: 63 kg (138 lb 14.2 oz) IBW/kg (Calculated) : 68.4  Temp (24hrs), Avg:98 F (36.7 C), Min:98 F (36.7 C), Max:98 F (36.7 C)   No Known Allergies   Thank you for allowing pharmacy to be a part of this patient's care.  Wynona Neat, PharmD, BCPS  02/21/2022 4:16 AM

## 2022-02-22 ENCOUNTER — Inpatient Hospital Stay (HOSPITAL_COMMUNITY): Payer: Medicare Other

## 2022-02-22 DIAGNOSIS — R918 Other nonspecific abnormal finding of lung field: Secondary | ICD-10-CM | POA: Diagnosis not present

## 2022-02-22 DIAGNOSIS — Z7189 Other specified counseling: Secondary | ICD-10-CM | POA: Diagnosis not present

## 2022-02-22 DIAGNOSIS — T17908S Unspecified foreign body in respiratory tract, part unspecified causing other injury, sequela: Secondary | ICD-10-CM | POA: Diagnosis not present

## 2022-02-22 DIAGNOSIS — Z66 Do not resuscitate: Secondary | ICD-10-CM

## 2022-02-22 DIAGNOSIS — J869 Pyothorax without fistula: Secondary | ICD-10-CM | POA: Diagnosis not present

## 2022-02-22 DIAGNOSIS — Z515 Encounter for palliative care: Secondary | ICD-10-CM

## 2022-02-22 DIAGNOSIS — J690 Pneumonitis due to inhalation of food and vomit: Secondary | ICD-10-CM | POA: Diagnosis not present

## 2022-02-22 LAB — BASIC METABOLIC PANEL
Anion gap: 8 (ref 5–15)
BUN: 10 mg/dL (ref 8–23)
CO2: 37 mmol/L — ABNORMAL HIGH (ref 22–32)
Calcium: 9 mg/dL (ref 8.9–10.3)
Chloride: 97 mmol/L — ABNORMAL LOW (ref 98–111)
Creatinine, Ser: 0.59 mg/dL — ABNORMAL LOW (ref 0.61–1.24)
GFR, Estimated: >60 mL/min (ref >60)
Glucose, Bld: 105 mg/dL — ABNORMAL HIGH (ref 70–99)
Potassium: 3.9 mmol/L (ref 3.5–5.1)
Sodium: 142 mmol/L (ref 135–145)

## 2022-02-22 MED ORDER — SODIUM CHLORIDE 0.9 % IV SOLN
3.0000 g | Freq: Four times a day (QID) | INTRAVENOUS | Status: DC
  Administered 2022-02-22 – 2022-02-26 (×16): 3 g via INTRAVENOUS
  Filled 2022-02-22 (×16): qty 8

## 2022-02-22 MED ORDER — IOHEXOL 350 MG/ML SOLN
75.0000 mL | Freq: Once | INTRAVENOUS | Status: AC | PRN
  Administered 2022-02-22: 75 mL via INTRAVENOUS

## 2022-02-22 MED ORDER — SODIUM CHLORIDE 0.9 % IV BOLUS
1000.0000 mL | Freq: Once | INTRAVENOUS | Status: AC
  Administered 2022-02-22: 1000 mL via INTRAVENOUS

## 2022-02-22 MED ORDER — UMECLIDINIUM-VILANTEROL 62.5-25 MCG/ACT IN AEPB
1.0000 | INHALATION_SPRAY | Freq: Every day | RESPIRATORY_TRACT | Status: DC
  Administered 2022-02-22 – 2022-02-28 (×6): 1 via RESPIRATORY_TRACT
  Filled 2022-02-22: qty 14

## 2022-02-22 NOTE — Evaluation (Signed)
Clinical/Bedside Swallow Evaluation Patient Details  Name: Kenneth Coleman MRN: [id number] Date of Birth: 17-Dec-1955  Today's Date: 02/22/2022 Time: SLP Start Time (ACUTE ONLY): 1145 SLP Stop Time (ACUTE ONLY): 1215 SLP Time Calculation (min) (ACUTE ONLY): 30 min  Past Medical History:  Past Medical History:  Diagnosis Date   CHF (congestive heart failure) (HCC)    COPD (chronic obstructive pulmonary disease) ([place])    Coronary artery disease    Hypertension    Past Surgical History:  Past Surgical History:  Procedure Laterality Date   BIOPSY  01/16/2020   Procedure: BIOPSY;  Surgeon: Harvel Quale, MD;  [facility];  Service: Gastroenterology;;  esophageal biopsies @25 ,37,20   CHOLECYSTECTOMY Bilateral 01/21/2020   Procedure: LAPAROSCOPIC CHOLECYSTECTOMY;  Surgeon: Greer Pickerel, MD;  Location: [place];  Service: General;  Laterality: Bilateral;   ENDOSCOPIC RETROGRADE CHOLANGIOPANCREATOGRAPHY (ERCP) WITH PROPOFOL N/A 01/20/2020   Procedure: ENDOSCOPIC RETROGRADE CHOLANGIOPANCREATOGRAPHY (ERCP) WITH PROPOFOL;  Surgeon: Irving Copas., MD;  Location: [place];  Service: Gastroenterology;  Laterality: N/A;   ESOPHAGOGASTRODUODENOSCOPY (EGD) WITH PROPOFOL N/A 01/16/2020   Procedure: ESOPHAGOGASTRODUODENOSCOPY (EGD) WITH PROPOFOL;  Surgeon: Harvel Quale, MD;  [facility];  Service: Gastroenterology;  Laterality: N/A;   ESOPHAGOGASTRODUODENOSCOPY (EGD) WITH PROPOFOL N/A 01/20/2020   Procedure: ESOPHAGOGASTRODUODENOSCOPY (EGD) WITH PROPOFOL;  Surgeon: Rush Landmark Telford Nab., MD;  Location: [place];  Service: Gastroenterology;  Laterality: N/A;   HEMOSTASIS CLIP PLACEMENT  01/20/2020   Procedure: HEMOSTASIS CLIP PLACEMENT;  Surgeon: Irving Copas., MD;  Location: [place];  Service: Gastroenterology;;   HOT HEMOSTASIS N/A 01/20/2020   Procedure: HOT HEMOSTASIS (ARGON PLASMA COAGULATION/BICAP);  Surgeon: Irving Copas., MD;  Location: [employer];  Service: Gastroenterology;  Laterality: N/A;   REMOVAL OF STONES  01/20/2020   Procedure: REMOVAL OF STONES;  Surgeon: Rush Landmark Telford Nab., MD;  Location: [place];  Service: Gastroenterology;;   Lavell Islam REMOVAL  01/20/2020   Procedure: STENT REMOVAL;  Surgeon: Irving Copas., MD;  Location: [facility];  Service: Gastroenterology;;   HPI:  66 year old cigarette smoker with emphysema who has a new spiculated right upper lobe mass in addition to multiple chronic respiratory problems including COPD, chronic respiratory failure with hypoxemia and recurrent aspiration. CT shows evidence of recurrent aspiration per Dr Lake Bells.  Pt was evaluated by SLP in 01/2020. 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 Pt was recommended to f/u with SLP for therapy, unclear if he did so.    Assessment / Plan / Recommendation  Clinical Impression  Pt reports symptoms consistent with last evaluation in 2021. Pt reports a sensation of globus, a feeling of aspiration, changes in right ear pressure, frequent nasal regurgitation. He still eats regular solids and thin liquids and "makes due."  He isnt sure if the problem is better or worse over the last few years. He has never had any SLP therapy beyond his few visit from SLP here in 2021.  He is increasingly concerned about aspiration. He tries various strategies  such as tilting and turning his head, but isnt sure which way is better. He wonders what can be done. SLp was direct and realistic with pt. Pt has signs of neuromuscular weakness in his throat muscles (likely the vagus nerve given significant palatal deviation to the left and hyponasal resonance) that probably wont 'get better' at this point. However, there may be some strategies that could help pt eat  and drink withmore comfort, or reduce severity of aspiration. A FEES would  give direct visualization of the pharynx during strategies with clear results on laterality of  residue etc. Pt is willing to attempt a FEES to give him more information about his swallowing. He may or may not choose to follow recommendations after assessment. Pt in agreement with plan.  Checking in with MD to schedule FEES as he has several pending procedures/tests.      Aspiration Risk       Diet Recommendation Regular;Thin liquid   Liquid Administration via: Cup;Straw Medication Administration: Whole meds with liquid Supervision: Patient able to self feed Compensations: Slow rate;Small sips/bites Postural Changes: Seated upright at 90 degrees    Other  Recommendations Oral Care Recommendations: Oral care BID    Recommendations for follow up therapy are one component of a multi-disciplinary discharge planning process, led by the attending physician.  Recommendations may be updated based on patient status, additional functional criteria and insurance authorization.  Follow up Recommendations        Assistance Recommended at Discharge    Functional Status Assessment    Frequency and Duration            Prognosis        Swallow Study   General HPI: 66 year old cigarette smoker with emphysema who has a new spiculated right upper lobe mass in addition to multiple chronic respiratory problems including COPD, chronic respiratory failure with hypoxemia and recurrent aspiration. CT shows evidence of recurrent aspiration per Dr [place] Bells.  Pt was evaluated by SLP in 01/2020. 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 Pt was recommended to f/u with SLP for therapy, unclear if he did so. Previous Swallow Assessment: MBS 2021 - Pt's oral phase of swallowing is relatively functional but with little to no ROM of swallowing structures for his pharyngeal swallow, still seeming most consistent with neurological  etiology. He does not have anterior movement of his posterior pharyngeal wall or elevation of his velum (suspect L > R based on oral motor exam) so he cannot create a seal at his nasopharynx. Mild nasal regurgitation is noted with thin liquids that falls back down in the pharynx post-swallow. He also has almost no anterior burst of his hyoid, base of tongue retraction, pharyngeal squeeze, or epiglottic inversion. He also appears to have osteophytes that may be causing mildly reduced UES relaxation, but this is relatively adequate considering his pharyngeal swallow appears almost passive. Residue sits mostly in his valleculae, at times filling this whole space, but the pt will bring the residue back up into his mouth to perform repeated swallows. Residue is reduced but not eliminated by this strategy. Attempted various head positions (turns L/R, tilt, chin tuck) without any significant change in function. Pt manages to achieve adequate laryngeal vestibule closure despite the above, allowing him to protect his airway well during swallow. A few instances of penetration after the swallow on thin liquid residue was sensed quickly and ejected spontaneously with a throat clear. Recommend continuing Dys 3 diet and thin liquids as tolerated by pt. Could consider trial of swallowing therapy although prognosis is difficult to establish given uncertain etiology and time post-onset. Diet Prior to this Study: Regular;Thin liquids Temperature Spikes Noted: No Respiratory Status: Room air History of Recent Intubation: No Behavior/Cognition: Alert;Cooperative;Pleasant mood Oral Cavity - Dentition: Adequate natural dentition Self-Feeding Abilities: Able to feed self Volitional Cough: Strong Volitional  Swallow: Able to elicit    Oral/Motor/Sensory Function Overall Oral Motor/Sensory Function: Moderate impairment Facial ROM: Within Functional Limits Facial Symmetry: Within Functional Limits Facial Strength: Within  Functional Limits Facial Sensation: Within Functional Limits Lingual ROM: Within Functional Limits Lingual Symmetry: Within Functional Limits Lingual Strength: Within Functional Limits Lingual Sensation: Within Functional Limits Velum: Impaired left;Suspected CN X (Vagus) dysfunction   Ice Chips     Thin Liquid Thin Liquid: Impaired Presentation: Straw Pharyngeal  Phase Impairments: Throat Clearing - Immediate    Nectar Thick Nectar Thick Liquid: Not tested   Honey Thick Honey Thick Liquid: Not tested   Puree Puree: Impaired Presentation: Spoon;Self Fed Pharyngeal Phase Impairments: Multiple swallows (complaint of globus)   Solid     Solid: Not tested      Maxen Rowland, Katherene Ponto 02/22/2022,12:36 PM

## 2022-02-22 NOTE — Progress Notes (Signed)
Mobility Specialist - Progress Note   02/22/22 1400  Mobility  Activity Ambulated independently in hallway  Level of Assistance Modified independent, requires aide device or extra time  Assistive Device Front wheel walker  Distance Ambulated (ft) 200 ft  Activity Response Tolerated well  $Mobility charge 1 Mobility    Pt received in bed agreeable to mobility. Took seated rest break x1. Left sitting EOB w/ call bell in reach and all needs met.   [place] Specialist Please contact via SecureChat or Rehab office at [phone]

## 2022-02-22 NOTE — H&P (View-Only) (Signed)
NAME:  Kenneth Coleman, MRN:  [id number], DOB:  1956-01-21, LOS: 1 ADMISSION DATE:  02/21/2022, CONSULTATION DATE:  11/16 REFERRING MD:  Aileen Fass, CHIEF COMPLAINT:  Dyspnea   History of Present Illness:  66 year old male transferred to [facility] on February 21, 2022 from [facility] in the setting of shortness of breath.  He has a complex past medical history which includes chronic respiratory failure with hypoxemia and emphysema.  Apparently over the last several months he has had recurrent episodes of pneumonia and has required chest tube placement in the left lung.  In review of his medical history he had episodes of aspiration pneumonia dating back to 2021 when he was treated at [facility] in the setting of choledocholithiasis.  CT scanning of his chest at that time was suggestive of aspiration pneumonia.  He came to the [facility] ED on November 11 complaining of shortness of breath and cough.  He was transferred to our facility for further evaluation.  CT surgery was consulted yesterday for the possibility of empyema.  Upon review of CT imaging no empyema was seen but there was chronic pleural thickening and chronic inflammatory changes from recurrent aspiration in the left lower lobe.  However, in addition to this there was a new spiculated nodule seen in the right upper lobe on a CT angiogram of the chest performed at [facility] earlier this week.  The radiologist compared that study 2 of 2021 study and stated that there had been interval development of a 2.2 cm spiculated mass in the medial right upper lobe suspicious for primary bronchogenic carcinoma. Currently his oxygen needs are at baseline, he is being treated with vancomycin and Zosyn.  Pulmonary and critical care medicine has been consulted for consideration of bronchoscopic biopsy of the spiculated right upper lobe mass.  He says that in the last month he has lost 12 pounds.  He has been treated several times  at [facility] during that time for aspiration pneumonia and had a chest tube about 3-4 weeks ago.  When he came to [facility] on 11/11 he couldn't breathe, but he said he had no cough, no fever, no leg swelling.    He has been told that "food goes down the wrong pipe", but he doesn't recall an SLP evaluation.    He quit smoking cigarettes 3 months ago.  Quit drinking alcohol 1 year ago.   Pertinent  Medical History  COPD CHF> chronic diastolic CAD Hypertension Choledocholithiasis Dysphagia Esophageal thickening Malnutrition Chronic respiratory failure with hypoxemia Esophagitis Chronic anemia  Significant Hospital Events: Including procedures, antibiotic start and stop dates in addition to other pertinent events   February 17, 2022 CT angiogram chest images independently reviewed showing no pulmonary embolism, mild to moderate centrilobular emphysema and an upper lobe predominant fashion, 2.2 cm spiculated mass in the right upper lobe, mucous plugging and what appears to be scarring versus chronic subsegmental atelectasis in the right lower lobe, pleural thickening and significant bandlike scarring in the left lower lobe. February 21, 2022 transthoracic echocardiogram LVEF 60 to 74%, grade 1 diastolic dysfunction, RV size and function normal, valves okay  Interim History / Subjective:  As above  Objective   Blood pressure 110/62, pulse 83, temperature 98.5 F (36.9 C), temperature source Oral, resp. rate 17, height 5\' 8"  (1.727 m), weight 63 kg, SpO2 100 %.        Intake/Output Summary (Last 24 hours) at 02/22/2022 0831 Last data filed at 02/22/2022 0534 Gross per 24  hour  Intake 567.58 ml  Output 1452 ml  Net -884.42 ml   Filed Weights   02/21/22 0400  Weight: 63 kg    Examination:  General:  Resting comfortably in bed HENT: NCAT OP clear poor dentition PULM: rhonchi bilaterally B, normal effort CV: RRR, no mgr GI: BS+, soft, nontender MSK: normal bulk and  tone Neuro: awake, alert, no distress, MAEW   Resolved Hospital Problem list     Assessment & Plan:  Spiculated right upper lobe mass Chronic respiratory failure with hypoxemia COPD, not in exacerbation Chronic diastolic heart failure Chronic aspiration pneumonitis and pneumonia History of empyema Cigarette smoker, former Former alcohol abuse Protein calorie malnutrition, moderate Physical deconditioning  Discussion: 66 year old cigarette smoker with emphysema who has a new spiculated right upper lobe mass in addition to multiple chronic respiratory problems including COPD, chronic respiratory failure with hypoxemia and recurrent aspiration.  Pulmonary and critical care medicine have been consulted for consideration of biopsy of the right upper lobe spiculated mass.  In general his performance status is very low.  He and I had a frank conversation about his overall health.  He and I agree that his 6-12 month prognosis is poor due to the recurrent pneumonia, not necessarily the tumor.  However he'd like to know if he has treatment options for his lung cancer, so he'd like to have a biopsy.    Plan: Super D CT chest Will make arrangements for robotic bronchoscopy of RUL mass Continue O2 to maintain O2 saturation > 88% Monitor volume status carefully Continue coreg, dig SLP evaluation Add Anoro daily Will consult palliative medicine   Best Practice (right click and "Reselect all SmartList Selections" daily)   Per primary  I updated his daughter by phone  Labs   CBC: Recent Labs  Lab 02/21/22 0442  WBC 8.1  HGB 9.4*  HCT 32.9*  MCV 87.5  PLT 865    Basic Metabolic Panel: Recent Labs  Lab 02/21/22 0435 02/21/22 1213 02/22/22 0250  NA 145 143 142  K 3.3* 4.4 3.9  CL 100 99 97*  CO2 36* 34* 37*  GLUCOSE 91 85 105*  BUN <5* 7* 10  CREATININE 0.53* 0.44* 0.59*  CALCIUM 8.0* 8.6* 9.0   GFR: Estimated Creatinine Clearance: 79.8 mL/min (A) (by C-G formula  based on SCr of 0.59 mg/dL (L)). Recent Labs  Lab 02/21/22 0442  WBC 8.1  LATICACIDVEN 1.4    Liver Function Tests: Recent Labs  Lab 02/21/22 1213  AST 12*  ALT 10  ALKPHOS 54  BILITOT 0.5  PROT 5.9*  ALBUMIN 2.7*   No results for input(s): "LIPASE", "AMYLASE" in the last 168 hours. No results for input(s): "AMMONIA" in the last 168 hours.  ABG    Component Value Date/Time   TCO2 28 12/21/2012 1812     Coagulation Profile: Recent Labs  Lab 02/21/22 1213  INR 1.2    Cardiac Enzymes: No results for input(s): "CKTOTAL", "CKMB", "CKMBINDEX", "TROPONINI" in the last 168 hours.  HbA1C: No results found for: "HGBA1C"  CBG: No results for input(s): "GLUCAP" in the last 168 hours.  Review of Systems:   Gen: Denies fever, chills, + weight change, fatigue, night sweats HEENT: Denies blurred vision, double vision, hearing loss, tinnitus, sinus congestion, rhinorrhea, sore throat, neck stiffness, dysphagia PULM: per HPI CV: Denies chest pain, edema, orthopnea, paroxysmal nocturnal dyspnea, palpitations GI: Denies abdominal pain, nausea, vomiting, diarrhea, hematochezia, melena, constipation, change in bowel habits GU: Denies dysuria, hematuria, polyuria, oliguria,  urethral discharge Endocrine: Denies hot or cold intolerance, polyuria, polyphagia or appetite change Derm: Denies rash, dry skin, scaling or peeling skin change Heme: Denies easy bruising, bleeding, bleeding gums Neuro: Denies headache, numbness, weakness, slurred speech, loss of memory or consciousness   Past Medical History:  He,  has a past medical history of CHF (congestive heart failure) ([place]), COPD (chronic obstructive pulmonary disease) ([place]), Coronary artery disease, and Hypertension.   Surgical History:   Past Surgical History:  Procedure Laterality Date   BIOPSY  01/16/2020   Procedure: BIOPSY;  Surgeon: Harvel Quale, MD;  [facility];  Service: Gastroenterology;;   esophageal biopsies @25 ,37,20   CHOLECYSTECTOMY Bilateral 01/21/2020   Procedure: LAPAROSCOPIC CHOLECYSTECTOMY;  Surgeon: Greer Pickerel, MD;  Location: [place];  Service: General;  Laterality: Bilateral;   ENDOSCOPIC RETROGRADE CHOLANGIOPANCREATOGRAPHY (ERCP) WITH PROPOFOL N/A 01/20/2020   Procedure: ENDOSCOPIC RETROGRADE CHOLANGIOPANCREATOGRAPHY (ERCP) WITH PROPOFOL;  Surgeon: Irving Copas., MD;  Location: [place];  Service: Gastroenterology;  Laterality: N/A;   ESOPHAGOGASTRODUODENOSCOPY (EGD) WITH PROPOFOL N/A 01/16/2020   Procedure: ESOPHAGOGASTRODUODENOSCOPY (EGD) WITH PROPOFOL;  Surgeon: Harvel Quale, MD;  [facility];  Service: Gastroenterology;  Laterality: N/A;   ESOPHAGOGASTRODUODENOSCOPY (EGD) WITH PROPOFOL N/A 01/20/2020   Procedure: ESOPHAGOGASTRODUODENOSCOPY (EGD) WITH PROPOFOL;  Surgeon: Rush Landmark Telford Nab., MD;  Location: [place];  Service: Gastroenterology;  Laterality: N/A;   HEMOSTASIS CLIP PLACEMENT  01/20/2020   Procedure: HEMOSTASIS CLIP PLACEMENT;  Surgeon: Irving Copas., MD;  Location: [place];  Service: Gastroenterology;;   HOT HEMOSTASIS N/A 01/20/2020   Procedure: HOT HEMOSTASIS (ARGON PLASMA COAGULATION/BICAP);  Surgeon: Irving Copas., MD;  Location: [place];  Service: Gastroenterology;  Laterality: N/A;   REMOVAL OF STONES  01/20/2020   Procedure: REMOVAL OF STONES;  Surgeon: Rush Landmark Telford Nab., MD;  Location: [place];  Service: Gastroenterology;;   Lavell Islam REMOVAL  01/20/2020   Procedure: STENT REMOVAL;  Surgeon: Irving Copas., MD;  Location: [place];  Service: Gastroenterology;;     Social History:   reports that he has been smoking cigarettes. He has never used smokeless tobacco. He reports that he does not drink alcohol and does not use drugs.   Family History:  His family history is not on file.   Allergies No Known Allergies   Home Medications  Prior to  Admission medications   Medication Sig Start Date End Date Taking? Authorizing Provider  albuterol (VENTOLIN HFA) 108 (90 Base) MCG/ACT inhaler Inhale 2 puffs into the lungs every 6 (six) hours as needed for wheezing or shortness of breath.    [provider]  apixaban (ELIQUIS) 5 MG TABS tablet Take 5 mg by mouth 2 (two) times daily.    [provider]  ASPIRIN LOW DOSE 81 MG EC tablet Take 81 mg by mouth daily. 12/22/19   [provider]  carvedilol (COREG) 25 MG tablet Take 25 mg by mouth 2 (two) times daily.    [provider]  cholecalciferol (VITAMIN D) 25 MCG (1000 UNIT) tablet Take 1,000 Units by mouth daily.    [provider]  digoxin (LANOXIN) 0.125 MG tablet Take 125 mcg by mouth daily. 10/22/19   [provider]  docusate sodium (COLACE) 100 MG capsule Take 1 capsule (100 mg total) by mouth daily. 01/25/20   Mariel Aloe, MD  feeding supplement (ENSURE ENLIVE / ENSURE PLUS) LIQD Take 237 mLs by mouth 3 (three) times daily between meals. 01/25/20   Mariel Aloe, MD  furosemide (LASIX) 20 MG tablet Take 20 mg by mouth daily. 12/22/19   [provider]  levofloxacin (LEVAQUIN) 750 MG tablet Take 750 mg by mouth daily. 5 day course. 02/10/22   [provider]  mirtazapine (REMERON) 15 MG tablet Take 15 mg by mouth at bedtime.    [provider]  Multiple Vitamin (MULTIVITAMIN WITH MINERALS) TABS tablet Take 1 tablet by mouth daily. 01/25/20   Mariel Aloe, MD  oxyCODONE (OXY IR/ROXICODONE) 5 MG immediate release tablet Take 1 tablet (5 mg total) by mouth every 4 (four) hours as needed for moderate pain or severe pain. 01/22/20   Saverio Danker, PA-C  pantoprazole (PROTONIX) 40 MG tablet Take 1 tablet (40 mg total) by mouth 2 (two) times daily before a meal. 01/25/20 04/14/22  Mariel Aloe, MD  Probiotic Product (ADVANCED PROBIOTIC) CAPS Take 1 capsule by mouth daily. 5 day course. 02/10/22   [provider]  spironolactone (ALDACTONE) 25 MG tablet Take 25 mg by mouth daily. 12/22/19   [provider]  sulfamethoxazole-trimethoprim (BACTRIM DS) 800-160 MG tablet Take 1 tablet by mouth 2 (two) times daily. 21 day course. 02/15/22   [provider]  umeclidinium-vilanterol (ANORO ELLIPTA) 62.5-25 MCG/ACT AEPB Inhale 1 puff into the lungs daily.    [provider]     Critical care time: n/a     Roselie Awkward, MD [place] [facility] Pager: [phone] Cell: [phone] After 7:00 pm call Elink  [phone]

## 2022-02-22 NOTE — Consult Note (Signed)
Consultation Note Date: 02/22/2022   Patient Name: Kenneth Coleman  DOB: [birth year]  MRN: [id number]  Age / Sex: 66 y.o., male  PCP: System, Provider Not In Referring Physician: Charlynne Cousins, MD  Reason for Consultation: Establishing goals of care  HPI/Patient Profile: 66 y.o. male  with past medical history of COPD, chronic respiratory failure with hypoxia, CHF, hypertension, CAD, dysphagia, and chronic anemia admitted on 02/21/2022 with Recurrent pneumonia. CTA at [facility] showed extensive endobronchial debris, with multiloculated left basilar pleural effusion associated with pleural thickening.  Patient also found to have spiculated mass in right upper lobe of lung.  PMT consulted to discuss goals of care.  Clinical Assessment and Goals of Care: I have reviewed medical records including EPIC notes, labs and imaging, assessed the patient and then met with patient to discuss diagnosis prognosis, GOC, EOL wishes, disposition and options.  I introduced Palliative Medicine as specialized medical care for people living with serious illness. It focuses on providing relief from the symptoms and stress of a serious illness. The goal is to improve quality of life for both the patient and the family.  Patient tells me prior to admission he was living alone totally independent.  He was driving and cooking meals for himself.  He tells me he has had frequent hospitalizations over the last month.  He does admit to a decreased appetite.  He tells me he tries to eat at least 2 meals a day and daily drinks a boost.  He tells me he remains on 3 L of oxygen at home.   We discussed patient's current illness and what it means in the larger context of patient's on-going co-morbidities.  Natural disease trajectory and expectations at EOL were discussed.  We discussed patient's recurrent aspiration pneumonia as well as lung mass.  I attempted to elicit  values and goals of care important to the patient.    The difference between aggressive medical intervention and comfort care was considered in light of the patient's goals of care.   At this point patient is interested in continuing current level of treatment as well as pursuing work-up of lung mass.  Encouraged patient to consider DNR/DNI status understanding evidenced based poor outcomes in similar hospitalized patients, as the cause of the arrest is likely associated with chronic/terminal disease rather than a reversible acute cardio-pulmonary event.  Patient agrees DNR/DNI status is in line with his goals of care.  Discussed with patient the importance of continued conversation with family and the medical providers regarding overall plan of care and treatment options, ensuring decisions are within the context of the patients values and GOCs.    Patient shares if he were ever unable to make decisions for himself he would want his next of kin, daughter Kenneth Coleman, to make decisions for him.  Patient agreeable to spiritual care consult.  Patient requests I discussed the above conversation with his daughter.  Called to [facility].  Reviewed above.  She expresses understanding.  She tells me since September 30 her dad has drastically declined and required multiple hospitalizations.  She tells me she feels that he is tired.  We discussed his decision to change his CODE STATUS to DNR/DNI.  She tells me this is in line with previously stated wishes and she is not surprised by this.  She tells me her intent to respect what ever wishes he expresses and continue to make decisions for him in line with previously stated wishes.  She tells me at 1 point  in the past month patient was on hospice services due to intense symptom management needs.  She tells me once his symptoms are well managed he improved and revoked hospice services.  We discussed patient likely eligible for those services again whenever he is  ready.  Questions and concerns were addressed. The family was encouraged to call with questions or concerns.  Primary Decision Maker PATIENT Daughter Kenneth Coleman if patient unable  SUMMARY OF RECOMMENDATIONS   CODE STATUS changed to DNR/DNI Patient open to all of their medical interventions -would like to pursue work-up of lung mass and ongoing dysphagia support with SLP Spiritual care consult Daughter agrees to plan and supportive of whatever patient desires Patient has had hospice support in the past but then revoked hospice services  Code Status/Advance Care Planning: DNR      Primary Diagnoses: Present on Admission:  Empyema ([place])  COPD (chronic obstructive pulmonary disease) ([place])  Coronary artery disease  Hypertension   I have reviewed the medical record, interviewed the patient and family, and examined the patient. The following aspects are pertinent.  Past Medical History:  Diagnosis Date   CHF (congestive heart failure) ([facility])    COPD (chronic obstructive pulmonary disease) ([facility])    Coronary artery disease    Hypertension    Social History   Socioeconomic History   Marital status: Single    Spouse name: Not on file   Number of children: Not on file   Years of education: Not on file   Highest education level: Not on file  Occupational History   Not on file  Tobacco Use   Smoking status: Every Day    Types: Cigarettes   Smokeless tobacco: Never  Vaping Use   Vaping Use: Never used  Substance and Sexual Activity   Alcohol use: No   Drug use: Never   Sexual activity: Not Currently  Other Topics Concern   Not on file  Social History Narrative   Not on file   Social Determinants of Health   Financial Resource Strain: Not on file  Food Insecurity: Not on file  Transportation Needs: Not on file  Physical Activity: Not on file  Stress: Not on file  Social Connections: Not on file   History reviewed. No pertinent family history. Scheduled Meds:   carvedilol  3.125 mg Oral BID   digoxin  125 mcg Oral Daily   feeding supplement  237 mL Oral TID WC   pantoprazole  40 mg Oral Daily   umeclidinium-vilanterol  1 puff Inhalation Daily   Continuous Infusions:  ampicillin-sulbactam (UNASYN) IV 3 g (02/22/22 1345)   PRN Meds:.acetaminophen **OR** acetaminophen, albuterol, oxyCODONE No Known Allergies Review of Systems  Constitutional:  Positive for appetite change.  Neurological:  Positive for weakness.    Physical Exam Constitutional:      General: He is not in acute distress.    Appearance: He is ill-appearing.  Pulmonary:     Effort: Pulmonary effort is normal.  Skin:    General: Skin is warm and dry.  Neurological:     Mental Status: He is alert and oriented to person, place, and time.     Vital Signs: BP 120/62 (BP Location: Left Arm)   Pulse 93   Temp 98.2 F (36.8 C) (Oral)   Resp 16   Ht _0  (1.727 m)   Wt 63 kg   SpO2 100%   BMI 21.12 kg/m  Pain Scale: 0-10   Pain Score: 0-No pain   SpO2:  SpO2: 100 % O2 Device:SpO2: 100 % O2 Flow Rate: .O2 Flow Rate (L/min): 3 L/min  IO: Intake/output summary:  Intake/Output Summary (Last 24 hours) at 02/22/2022 1359 Last data filed at 02/22/2022 0534 Gross per 24 hour  Intake 567.58 ml  Output 1002 ml  Net -434.42 ml    LBM: Last BM Date : 02/21/22 Baseline Weight: Weight: 63 kg Most recent weight: Weight: 63 kg     Palliative Assessment/Data: PPS 60%     *Please note that this is a verbal dictation therefore any spelling or grammatical errors are due to the "[place]" system interpretation.   Juel Burrow, DNP, AGNP-C Palliative Medicine Team [phone] Pager: [phone]

## 2022-02-22 NOTE — Consult Note (Signed)
NAME:  Kenneth Coleman, MRN:  [id number], DOB:  [birth year], LOS: 1 ADMISSION DATE:  02/21/2022, CONSULTATION DATE:  11/16 REFERRING MD:  Aileen Fass, CHIEF COMPLAINT:  Dyspnea   History of Present Illness:  67 year old male transferred to [facility] on February 21, 2022 from [facility] in the setting of shortness of breath.  He has a complex past medical history which includes chronic respiratory failure with hypoxemia and emphysema.  Apparently over the last several months he has had recurrent episodes of pneumonia and has required chest tube placement in the left lung.  In review of his medical history he had episodes of aspiration pneumonia dating back to 2021 when he was treated at [facility] in the setting of choledocholithiasis.  CT scanning of his chest at that time was suggestive of aspiration pneumonia.  He came to the [facility] ED on November 11 complaining of shortness of breath and cough.  He was transferred to our facility for further evaluation.  CT surgery was consulted yesterday for the possibility of empyema.  Upon review of CT imaging no empyema was seen but there was chronic pleural thickening and chronic inflammatory changes from recurrent aspiration in the left lower lobe.  However, in addition to this there was a new spiculated nodule seen in the right upper lobe on a CT angiogram of the chest performed at [facility] earlier this week.  The radiologist compared that study 2 of 2021 study and stated that there had been interval development of a 2.2 cm spiculated mass in the medial right upper lobe suspicious for primary bronchogenic carcinoma. Currently his oxygen needs are at baseline, he is being treated with vancomycin and Zosyn.  Pulmonary and critical care medicine has been consulted for consideration of bronchoscopic biopsy of the spiculated right upper lobe mass.  He says that in the last month he has lost 12 pounds.  He has been treated several times  at [facility] during that time for aspiration pneumonia and had a chest tube about 3-4 weeks ago.  When he came to [facility] on 11/11 he couldn't breathe, but he said he had no cough, no fever, no leg swelling.    He has been told that "food goes down the wrong pipe", but he doesn't recall an SLP evaluation.    He quit smoking cigarettes 3 months ago.  Quit drinking alcohol 1 year ago.   Pertinent  Medical History  COPD CHF> chronic diastolic CAD Hypertension Choledocholithiasis Dysphagia Esophageal thickening Malnutrition Chronic respiratory failure with hypoxemia Esophagitis Chronic anemia  Significant Hospital Events: Including procedures, antibiotic start and stop dates in addition to other pertinent events   February 17, 2022 CT angiogram chest images independently reviewed showing no pulmonary embolism, mild to moderate centrilobular emphysema and an upper lobe predominant fashion, 2.2 cm spiculated mass in the right upper lobe, mucous plugging and what appears to be scarring versus chronic subsegmental atelectasis in the right lower lobe, pleural thickening and significant bandlike scarring in the left lower lobe. February 21, 2022 transthoracic echocardiogram LVEF 60 to 53%, grade 1 diastolic dysfunction, RV size and function normal, valves okay  Interim History / Subjective:  As above  Objective   Blood pressure 110/62, pulse 83, temperature 98.5 F (36.9 C), temperature source Oral, resp. rate 17, height 5\' 8"  (1.727 m), weight 63 kg, SpO2 100 %.        Intake/Output Summary (Last 24 hours) at 02/22/2022 0831 Last data filed at 02/22/2022 0534 Gross per 24  hour  Intake 567.58 ml  Output 1452 ml  Net -884.42 ml   Filed Weights   02/21/22 0400  Weight: 63 kg    Examination:  General:  Resting comfortably in bed HENT: NCAT OP clear poor dentition PULM: rhonchi bilaterally B, normal effort CV: RRR, no mgr GI: BS+, soft, nontender MSK: normal bulk and  tone Neuro: awake, alert, no distress, MAEW   Resolved Hospital Problem list     Assessment & Plan:  Spiculated right upper lobe mass Chronic respiratory failure with hypoxemia COPD, not in exacerbation Chronic diastolic heart failure Chronic aspiration pneumonitis and pneumonia History of empyema Cigarette smoker, former Former alcohol abuse Protein calorie malnutrition, moderate Physical deconditioning  Discussion: 66 year old cigarette smoker with emphysema who has a new spiculated right upper lobe mass in addition to multiple chronic respiratory problems including COPD, chronic respiratory failure with hypoxemia and recurrent aspiration.  Pulmonary and critical care medicine have been consulted for consideration of biopsy of the right upper lobe spiculated mass.  In general his performance status is very low.  He and I had a frank conversation about his overall health.  He and I agree that his 6-12 month prognosis is poor due to the recurrent pneumonia, not necessarily the tumor.  However he'd like to know if he has treatment options for his lung cancer, so he'd like to have a biopsy.    Plan: Super D CT chest Will make arrangements for robotic bronchoscopy of RUL mass Continue O2 to maintain O2 saturation > 88% Monitor volume status carefully Continue coreg, dig SLP evaluation Add Anoro daily Will consult palliative medicine   Best Practice (right click and "Reselect all SmartList Selections" daily)   Per primary  I updated his daughter by phone  Labs   CBC: Recent Labs  Lab 02/21/22 0442  WBC 8.1  HGB 9.4*  HCT 32.9*  MCV 87.5  PLT 264    Basic Metabolic Panel: Recent Labs  Lab 02/21/22 0435 02/21/22 1213 02/22/22 0250  NA 145 143 142  K 3.3* 4.4 3.9  CL 100 99 97*  CO2 36* 34* 37*  GLUCOSE 91 85 105*  BUN <5* 7* 10  CREATININE 0.53* 0.44* 0.59*  CALCIUM 8.0* 8.6* 9.0   GFR: Estimated Creatinine Clearance: 79.8 mL/min (A) (by C-G formula  based on SCr of 0.59 mg/dL (L)). Recent Labs  Lab 02/21/22 0442  WBC 8.1  LATICACIDVEN 1.4    Liver Function Tests: Recent Labs  Lab 02/21/22 1213  AST 12*  ALT 10  ALKPHOS 54  BILITOT 0.5  PROT 5.9*  ALBUMIN 2.7*   No results for input(s): "LIPASE", "AMYLASE" in the last 168 hours. No results for input(s): "AMMONIA" in the last 168 hours.  ABG    Component Value Date/Time   TCO2 28 12/21/2012 1812     Coagulation Profile: Recent Labs  Lab 02/21/22 1213  INR 1.2    Cardiac Enzymes: No results for input(s): "CKTOTAL", "CKMB", "CKMBINDEX", "TROPONINI" in the last 168 hours.  HbA1C: No results found for: "HGBA1C"  CBG: No results for input(s): "GLUCAP" in the last 168 hours.  Review of Systems:   Gen: Denies fever, chills, + weight change, fatigue, night sweats HEENT: Denies blurred vision, double vision, hearing loss, tinnitus, sinus congestion, rhinorrhea, sore throat, neck stiffness, dysphagia PULM: per HPI CV: Denies chest pain, edema, orthopnea, paroxysmal nocturnal dyspnea, palpitations GI: Denies abdominal pain, nausea, vomiting, diarrhea, hematochezia, melena, constipation, change in bowel habits GU: Denies dysuria, hematuria, polyuria, oliguria,  urethral discharge Endocrine: Denies hot or cold intolerance, polyuria, polyphagia or appetite change Derm: Denies rash, dry skin, scaling or peeling skin change Heme: Denies easy bruising, bleeding, bleeding gums Neuro: Denies headache, numbness, weakness, slurred speech, loss of memory or consciousness   Past Medical History:  He,  has a past medical history of CHF (congestive heart failure) ([place]), COPD (chronic obstructive pulmonary disease) ([place]), Coronary artery disease, and Hypertension.   Surgical History:   Past Surgical History:  Procedure Laterality Date   BIOPSY  01/16/2020   Procedure: BIOPSY;  Surgeon: Harvel Quale, MD;  [facility];  Service: Gastroenterology;;   esophageal biopsies @25 ,37,20   CHOLECYSTECTOMY Bilateral 01/21/2020   Procedure: LAPAROSCOPIC CHOLECYSTECTOMY;  Surgeon: Greer Pickerel, MD;  Location: [place];  Service: General;  Laterality: Bilateral;   ENDOSCOPIC RETROGRADE CHOLANGIOPANCREATOGRAPHY (ERCP) WITH PROPOFOL N/A 01/20/2020   Procedure: ENDOSCOPIC RETROGRADE CHOLANGIOPANCREATOGRAPHY (ERCP) WITH PROPOFOL;  Surgeon: Irving Copas., MD;  Location: [place];  Service: Gastroenterology;  Laterality: N/A;   ESOPHAGOGASTRODUODENOSCOPY (EGD) WITH PROPOFOL N/A 01/16/2020   Procedure: ESOPHAGOGASTRODUODENOSCOPY (EGD) WITH PROPOFOL;  Surgeon: Harvel Quale, MD;  [facility];  Service: Gastroenterology;  Laterality: N/A;   ESOPHAGOGASTRODUODENOSCOPY (EGD) WITH PROPOFOL N/A 01/20/2020   Procedure: ESOPHAGOGASTRODUODENOSCOPY (EGD) WITH PROPOFOL;  Surgeon: Rush Landmark Telford Nab., MD;  Location: [place];  Service: Gastroenterology;  Laterality: N/A;   HEMOSTASIS CLIP PLACEMENT  01/20/2020   Procedure: HEMOSTASIS CLIP PLACEMENT;  Surgeon: Irving Copas., MD;  Location: [place];  Service: Gastroenterology;;   HOT HEMOSTASIS N/A 01/20/2020   Procedure: HOT HEMOSTASIS (ARGON PLASMA COAGULATION/BICAP);  Surgeon: Irving Copas., MD;  Location: [place];  Service: Gastroenterology;  Laterality: N/A;   REMOVAL OF STONES  01/20/2020   Procedure: REMOVAL OF STONES;  Surgeon: Rush Landmark Telford Nab., MD;  Location: [place];  Service: Gastroenterology;;   Lavell Islam REMOVAL  01/20/2020   Procedure: STENT REMOVAL;  Surgeon: Irving Copas., MD;  Location: [place];  Service: Gastroenterology;;     Social History:   reports that he has been smoking cigarettes. He has never used smokeless tobacco. He reports that he does not drink alcohol and does not use drugs.   Family History:  His family history is not on file.   Allergies No Known Allergies   Home Medications  Prior to  Admission medications   Medication Sig Start Date End Date Taking? Authorizing Provider  albuterol (VENTOLIN HFA) 108 (90 Base) MCG/ACT inhaler Inhale 2 puffs into the lungs every 6 (six) hours as needed for wheezing or shortness of breath.    [provider]  apixaban (ELIQUIS) 5 MG TABS tablet Take 5 mg by mouth 2 (two) times daily.    [provider]  ASPIRIN LOW DOSE 81 MG EC tablet Take 81 mg by mouth daily. 12/22/19   [provider]  carvedilol (COREG) 25 MG tablet Take 25 mg by mouth 2 (two) times daily.    [provider]  cholecalciferol (VITAMIN D) 25 MCG (1000 UNIT) tablet Take 1,000 Units by mouth daily.    [provider]  digoxin (LANOXIN) 0.125 MG tablet Take 125 mcg by mouth daily. 10/22/19   [provider]  docusate sodium (COLACE) 100 MG capsule Take 1 capsule (100 mg total) by mouth daily. 01/25/20   Mariel Aloe, MD  feeding supplement (ENSURE ENLIVE / ENSURE PLUS) LIQD Take 237 mLs by mouth 3 (three) times daily between meals. 01/25/20   Mariel Aloe, MD  furosemide (LASIX) 20 MG tablet Take 20 mg by mouth daily. 12/22/19   [provider]  levofloxacin (LEVAQUIN) 750 MG tablet Take 750 mg by mouth daily. 5 day course. 02/10/22   [provider]  mirtazapine (REMERON) 15 MG tablet Take 15 mg by mouth at bedtime.    [provider]  Multiple Vitamin (MULTIVITAMIN WITH MINERALS) TABS tablet Take 1 tablet by mouth daily. 01/25/20   Mariel Aloe, MD  oxyCODONE (OXY IR/ROXICODONE) 5 MG immediate release tablet Take 1 tablet (5 mg total) by mouth every 4 (four) hours as needed for moderate pain or severe pain. 01/22/20   Saverio Danker, PA-C  pantoprazole (PROTONIX) 40 MG tablet Take 1 tablet (40 mg total) by mouth 2 (two) times daily before a meal. 01/25/20 04/14/22  Mariel Aloe, MD  Probiotic Product (ADVANCED PROBIOTIC) CAPS Take 1 capsule by mouth daily. 5 day course. 02/10/22   [provider]  spironolactone (ALDACTONE) 25 MG tablet Take 25 mg by mouth daily. 12/22/19   [provider]  sulfamethoxazole-trimethoprim (BACTRIM DS) 800-160 MG tablet Take 1 tablet by mouth 2 (two) times daily. 21 day course. 02/15/22   [provider]  umeclidinium-vilanterol (ANORO ELLIPTA) 62.5-25 MCG/ACT AEPB Inhale 1 puff into the lungs daily.    [provider]     Critical care time: n/a     Roselie Awkward, MD [place] [facility] Pager: [phone] Cell: [phone] After 7:00 pm call Elink  [phone]

## 2022-02-22 NOTE — Progress Notes (Addendum)
[facility] PROGRESS NOTE    Progress Note  Kenneth Coleman  DHR:[id number] DOB: 05/26/1955 DOA: 02/21/2022 PCP: System, Provider Not In     Brief Narrative:   Kenneth Coleman is an 66 y.o. male past medical history significant for chronic diastolic heart failure, COPD/chronic hypoxic respiratory failure on 2 L of oxygen at home, essential hypertension, recurrent pneumonias and pleural effusion requiring chest tube placement crying hospitalization comes in with worsening shortness of breath white count of 7 hemoglobin of 10   Assessment/Plan:   Complicated pleural effusion/aspiration pneumonia: CTA at [facility] showed extensive endobronchial debris, with multiloculated left basilar pleural effusion associated with pleural thickening. Transition IV antibiotics to IV Unasyn as it is probably an aspiration. Cultures have been negative till date, CT surgery was consulted relates this does not appear to be an empyema it looks more like a chronic appearing left lower lobe parenchymal consolidation. Check a swallowing evaluation. Consult palliative care for recurrent aspiration pneumonia  Incidental 2.2 cm spiculated mass within the right upper lobe: Concern about bronchogenic neoplasm, consult PCCM for spiculated mass for bronchoscopy.  Normocytic anemia: No signs of overt bleeding. Anemia panel showed ferritin of 50 saturation 10%. Will need iron supplementation.  Chronic diastolic heart failure: Appears euvolemic on physical exam.  COPD/chronic respiratory failure: Satting greater 90% on room air keep her on 2 to 3 L of oxygen.  CAD: Noted denies any symptoms.  Essential hypertension: Resume Coreg at a lower,   Hypokalemia: Replete orally recheck in the morning.    DVT prophylaxis: lovenox Family Communication:none Status is: Inpatient Remains inpatient appropriate because: Empyema    Code Status:     Code Status Orders  (From admission, onward)            Start     Ordered   02/21/22 0348  Full code  Continuous        02/21/22 0354           Code Status History     Date Active Date Inactive Code Status Order ID Comments User Context   01/15/2020 1926 01/25/2020 2230 Full Code [id number]  [name], DO ED         IV Access:   Peripheral IV   Procedures and diagnostic studies:   ECHOCARDIOGRAM COMPLETE  Result Date: 02/21/2022    ECHOCARDIOGRAM REPORT   Patient Name:   Kenneth Coleman Date of Exam: 02/21/2022 Medical Rec #:  [id number]      Height:       68.0 in Accession #:    [id number]     Weight:       138.9 lb Date of Birth:  12-Sep-1955      BSA:          1.750 m Patient Age:    24 years       BP:           128/71 mmHg Patient Gender: M              HR:           81 bpm. Exam Location:  Inpatient Procedure: 2D Echo Indications:    CHF-acute diastolic  History:        Patient has prior history of Echocardiogram examinations, most                 recent 01/17/2020. CAD, COPD; Risk Factors:Current Smoker and  Hypertension.  Sonographer:    Clayton Lefort RDCS (AE) Referring Phys: Shela Leff  Sonographer Comments: Image acquistion for apical right side challenging due to poor echo window. IMPRESSIONS  1. Left ventricular ejection fraction, by estimation, is 60 to 65%. The left ventricle has normal function. The left ventricle has no regional wall motion abnormalities. Left ventricular diastolic parameters are consistent with Grade I diastolic dysfunction (impaired relaxation).  2. Right ventricular systolic function is normal. The right ventricular size is normal.  3. The mitral valve is normal in structure. No evidence of mitral valve regurgitation. No evidence of mitral stenosis.  4. The aortic valve is normal in structure. Aortic valve regurgitation is not visualized. No aortic stenosis is present.  5. The inferior vena cava is normal in size with greater than 50% respiratory variability, suggesting  right atrial pressure of 3 mmHg. Comparison(s): Prior images reviewed side by side. FINDINGS  Left Ventricle: Left ventricular ejection fraction, by estimation, is 60 to 65%. The left ventricle has normal function. The left ventricle has no regional wall motion abnormalities. The left ventricular internal cavity size was normal in size. There is  no left ventricular hypertrophy. Left ventricular diastolic parameters are consistent with Grade I diastolic dysfunction (impaired relaxation). Right Ventricle: The right ventricular size is normal. No increase in right ventricular wall thickness. Right ventricular systolic function is normal. Left Atrium: Left atrial size was normal in size. Right Atrium: Right atrial size was normal in size. Pericardium: There is no evidence of pericardial effusion. Mitral Valve: The mitral valve is normal in structure. No evidence of mitral valve regurgitation. No evidence of mitral valve stenosis. Tricuspid Valve: The tricuspid valve is normal in structure. Tricuspid valve regurgitation is not demonstrated. No evidence of tricuspid stenosis. Aortic Valve: The aortic valve is normal in structure. Aortic valve regurgitation is not visualized. No aortic stenosis is present. Aortic valve mean gradient measures 2.0 mmHg. Aortic valve peak gradient measures 4.7 mmHg. Aortic valve area, by VTI measures 3.19 cm. Pulmonic Valve: The pulmonic valve was normal in structure. Pulmonic valve regurgitation is not visualized. No evidence of pulmonic stenosis. Aorta: The aortic root is normal in size and structure. Venous: The inferior vena cava is normal in size with greater than 50% respiratory variability, suggesting right atrial pressure of 3 mmHg. IAS/Shunts: No atrial level shunt detected by color flow Doppler.  LEFT VENTRICLE PLAX 2D LVIDd:         4.10 cm LVIDs:         3.20 cm LV PW:         1.00 cm LV IVS:        0.80 cm LVOT diam:     2.10 cm LV SV:         60 LV SV Index:   34 LVOT Area:      3.46 cm  LEFT ATRIUM             Index LA diam:        2.60 cm 1.49 cm/m LA Vol (A2C):   34.3 ml 19.60 ml/m LA Vol (A4C):   30.1 ml 17.20 ml/m LA Biplane Vol: 33.7 ml 19.25 ml/m  AORTIC VALVE AV Area (Vmax):    3.75 cm AV Area (Vmean):   3.54 cm AV Area (VTI):     3.19 cm AV Vmax:           108.00 cm/s AV Vmean:          68.700 cm/s AV VTI:  0.187 m AV Peak Grad:      4.7 mmHg AV Mean Grad:      2.0 mmHg LVOT Vmax:         117.00 cm/s LVOT Vmean:        70.200 cm/s LVOT VTI:          0.172 m LVOT/AV VTI ratio: 0.92  AORTA Ao Root diam: 3.50 cm  SHUNTS Systemic VTI:  0.17 m Systemic Diam: 2.10 cm Candee Furbish MD Electronically signed by Candee Furbish MD Signature Date/Time: 02/21/2022/4:43:00 PM    Final    DG Chest 2 View  Result Date: 02/21/2022 CLINICAL DATA:  Pneumonia and shortness of breath EXAM: CHEST - 2 VIEW COMPARISON:  Chest radiograph dated 01/16/2020, CTA chest dated 02/17/2022 FINDINGS: Normal lung volumes. Left lingular and lower lobe confluent and right basilar patchy opacities are again seen. Spiculated right upper lobe mass is better evaluated on prior CT. Left-greater-than-right small bilateral pleural effusions. No pneumothorax. The heart size and mediastinal contours are within normal limits. The visualized skeletal structures are unremarkable. IMPRESSION: 1. Left lingular and lower lobe confluent and right basilar patchy opacities are again seen, suspicious for pneumonia. 2. Spiculated right upper lobe mass is better evaluated on prior CT. 3. Small bilateral pleural effusions. Electronically Signed   By: Darrin Nipper M.D.   On: 02/21/2022 12:20     Medical Consultants:   None.   Subjective:    Kenneth Coleman no complaints feels better.  Objective:    Vitals:   02/21/22 1142 02/21/22 1749 02/21/22 2206 02/22/22 0534  BP: 128/71 113/63 113/73 110/62  Pulse: 77 83 84 83  Resp: 18 16 17 17   Temp: 98 F (36.7 C) 98.4 F (36.9 C) 98 F (36.7 C) 98.5 F (36.9  C)  TempSrc: Oral Oral Oral Oral  SpO2: 100% 100% 100% 100%  Weight:      Height:       SpO2: 100 % O2 Flow Rate (L/min): 3 L/min   Intake/Output Summary (Last 24 hours) at 02/22/2022 0800 Last data filed at 02/22/2022 0534 Gross per 24 hour  Intake 567.58 ml  Output 1752 ml  Net -1184.42 ml   Filed Weights   02/21/22 0400  Weight: 63 kg    Exam: General exam: In no acute distress. Respiratory system: Good air movement and clear to auscultation. Cardiovascular system: S1 & S2 heard, RRR. No JVD. Gastrointestinal system: Abdomen is nondistended, soft and nontender.  Extremities: No pedal edema. Skin: No rashes, lesions or ulcers Psychiatry: Judgement and insight appear normal. Mood & affect appropriate.   Data Reviewed:    Labs: Basic Metabolic Panel: Recent Labs  Lab 02/21/22 0435 02/21/22 1213 02/22/22 0250  NA 145 143 142  K 3.3* 4.4 3.9  CL 100 99 97*  CO2 36* 34* 37*  GLUCOSE 91 85 105*  BUN <5* 7* 10  CREATININE 0.53* 0.44* 0.59*  CALCIUM 8.0* 8.6* 9.0    GFR Estimated Creatinine Clearance: 79.8 mL/min (A) (by C-G formula based on SCr of 0.59 mg/dL (L)). Liver Function Tests: Recent Labs  Lab 02/21/22 1213  AST 12*  ALT 10  ALKPHOS 54  BILITOT 0.5  PROT 5.9*  ALBUMIN 2.7*   No results for input(s): "LIPASE", "AMYLASE" in the last 168 hours. No results for input(s): "AMMONIA" in the last 168 hours. Coagulation profile Recent Labs  Lab 02/21/22 1213  INR 1.2   COVID-19 Labs  Recent Labs    02/21/22 0435  FERRITIN 50  Lab Results  Component Value Date   SARSCOV2NAA NEGATIVE 01/15/2020    CBC: Recent Labs  Lab 02/21/22 0442  WBC 8.1  HGB 9.4*  HCT 32.9*  MCV 87.5  PLT 277    Cardiac Enzymes: No results for input(s): "CKTOTAL", "CKMB", "CKMBINDEX", "TROPONINI" in the last 168 hours. BNP (last 3 results) No results for input(s): "PROBNP" in the last 8760 hours. CBG: No results for input(s): "GLUCAP" in the last  168 hours. D-Dimer: No results for input(s): "DDIMER" in the last 72 hours. Hgb A1c: No results for input(s): "HGBA1C" in the last 72 hours. Lipid Profile: No results for input(s): "CHOL", "HDL", "LDLCALC", "TRIG", "CHOLHDL", "LDLDIRECT" in the last 72 hours. Thyroid function studies: Recent Labs    02/21/22 1213  TSH 0.810   Anemia work up: Recent Labs    02/21/22 0435 02/21/22 0442  VITAMINB12 412  --   FOLATE 37.3  --   FERRITIN 50  --   TIBC 267  --   IRON 26*  --   RETICCTPCT  --  1.6    Sepsis Labs: Recent Labs  Lab 02/21/22 0442  WBC 8.1  LATICACIDVEN 1.4    Microbiology No results found for this or any previous visit (from the past 240 hour(s)).   Medications:    carvedilol  3.125 mg Oral BID   digoxin  125 mcg Oral Daily   feeding supplement  237 mL Oral TID WC   pantoprazole  40 mg Oral Daily   Continuous Infusions:  piperacillin-tazobactam (ZOSYN)  IV 3.375 g (02/22/22 0513)   vancomycin Stopped (02/22/22 0005)      LOS: 1 day   Charlynne Cousins  [facility]  02/22/2022, 8:00 AM

## 2022-02-23 ENCOUNTER — Inpatient Hospital Stay (HOSPITAL_COMMUNITY): Payer: Medicare Other

## 2022-02-23 ENCOUNTER — Encounter (HOSPITAL_COMMUNITY): Payer: Self-pay | Admitting: Internal Medicine

## 2022-02-23 ENCOUNTER — Encounter (HOSPITAL_COMMUNITY)
Admission: RE | Disposition: A | Discharge status: Home Health | Payer: Self-pay | Source: Ambulatory Visit | Attending: Internal Medicine

## 2022-02-23 ENCOUNTER — Inpatient Hospital Stay (HOSPITAL_COMMUNITY): Payer: Medicare Other | Admitting: Certified Registered Nurse Anesthetist

## 2022-02-23 DIAGNOSIS — J9611 Chronic respiratory failure with hypoxia: Secondary | ICD-10-CM | POA: Diagnosis not present

## 2022-02-23 DIAGNOSIS — J869 Pyothorax without fistula: Secondary | ICD-10-CM | POA: Diagnosis not present

## 2022-02-23 DIAGNOSIS — I2510 Atherosclerotic heart disease of native coronary artery without angina pectoris: Secondary | ICD-10-CM | POA: Diagnosis not present

## 2022-02-23 DIAGNOSIS — J690 Pneumonitis due to inhalation of food and vomit: Secondary | ICD-10-CM | POA: Diagnosis not present

## 2022-02-23 DIAGNOSIS — J189 Pneumonia, unspecified organism: Secondary | ICD-10-CM | POA: Diagnosis not present

## 2022-02-23 DIAGNOSIS — R911 Solitary pulmonary nodule: Secondary | ICD-10-CM

## 2022-02-23 DIAGNOSIS — I110 Hypertensive heart disease with heart failure: Secondary | ICD-10-CM | POA: Diagnosis not present

## 2022-02-23 DIAGNOSIS — Z515 Encounter for palliative care: Secondary | ICD-10-CM | POA: Diagnosis not present

## 2022-02-23 DIAGNOSIS — I10 Essential (primary) hypertension: Secondary | ICD-10-CM | POA: Diagnosis not present

## 2022-02-23 DIAGNOSIS — Z7189 Other specified counseling: Secondary | ICD-10-CM | POA: Diagnosis not present

## 2022-02-23 DIAGNOSIS — Z66 Do not resuscitate: Secondary | ICD-10-CM | POA: Diagnosis not present

## 2022-02-23 DIAGNOSIS — F17200 Nicotine dependence, unspecified, uncomplicated: Secondary | ICD-10-CM

## 2022-02-23 DIAGNOSIS — I509 Heart failure, unspecified: Secondary | ICD-10-CM

## 2022-02-23 DIAGNOSIS — R918 Other nonspecific abnormal finding of lung field: Secondary | ICD-10-CM | POA: Diagnosis not present

## 2022-02-23 DIAGNOSIS — T17908D Unspecified foreign body in respiratory tract, part unspecified causing other injury, subsequent encounter: Secondary | ICD-10-CM

## 2022-02-23 HISTORY — PX: BRONCHIAL BIOPSY: SHX5109

## 2022-02-23 HISTORY — PX: BRONCHIAL NEEDLE ASPIRATION BIOPSY: SHX5106

## 2022-02-23 HISTORY — PX: BRONCHIAL BRUSHINGS: SHX5108

## 2022-02-23 HISTORY — PX: VIDEO BRONCHOSCOPY WITH RADIAL ENDOBRONCHIAL ULTRASOUND: SHX6849

## 2022-02-23 HISTORY — PX: BRONCHIAL WASHINGS: SHX5105

## 2022-02-23 SURGERY — BRONCHOSCOPY, WITH BIOPSY USING ELECTROMAGNETIC NAVIGATION
Anesthesia: General

## 2022-02-23 MED ORDER — PROPOFOL 500 MG/50ML IV EMUL
INTRAVENOUS | Status: DC | PRN
  Administered 2022-02-23: 80 ug/kg/min via INTRAVENOUS

## 2022-02-23 MED ORDER — FENTANYL CITRATE (PF) 100 MCG/2ML IJ SOLN: 25.0000 ug | INTRAMUSCULAR | Status: DC | PRN

## 2022-02-23 MED ORDER — GLYCOPYRROLATE 0.2 MG/ML IJ SOLN
INTRAMUSCULAR | Status: DC | PRN
  Administered 2022-02-23: .2 mg via INTRAVENOUS

## 2022-02-23 MED ORDER — OXYCODONE HCL 5 MG/5ML PO SOLN: 5.0000 mg | Freq: Once | ORAL | Status: DC | PRN

## 2022-02-23 MED ORDER — ROCURONIUM BROMIDE 10 MG/ML (PF) SYRINGE
PREFILLED_SYRINGE | INTRAVENOUS | Status: DC | PRN
  Administered 2022-02-23: 50 mg via INTRAVENOUS
  Administered 2022-02-23: 30 mg via INTRAVENOUS

## 2022-02-23 MED ORDER — OXYCODONE HCL 5 MG PO TABS: 5.0000 mg | ORAL_TABLET | Freq: Once | ORAL | Status: DC | PRN

## 2022-02-23 MED ORDER — SODIUM CHLORIDE 0.9 % IV SOLN: INTRAVENOUS | Status: AC

## 2022-02-23 MED ORDER — PHENYLEPHRINE HCL-NACL 20-0.9 MG/250ML-% IV SOLN
INTRAVENOUS | Status: DC | PRN
  Administered 2022-02-23: 50 ug/min via INTRAVENOUS

## 2022-02-23 MED ORDER — PROPOFOL 10 MG/ML IV BOLUS
INTRAVENOUS | Status: DC | PRN
  Administered 2022-02-23: 160 mg via INTRAVENOUS

## 2022-02-23 MED ORDER — SUGAMMADEX SODIUM 200 MG/2ML IV SOLN
INTRAVENOUS | Status: DC | PRN
  Administered 2022-02-23: 200 mg via INTRAVENOUS

## 2022-02-23 MED ORDER — LACTATED RINGERS IV SOLN: INTRAVENOUS | Status: DC | PRN

## 2022-02-23 MED ORDER — DEXAMETHASONE SODIUM PHOSPHATE 10 MG/ML IJ SOLN
INTRAMUSCULAR | Status: DC | PRN
  Administered 2022-02-23: 10 mg via INTRAVENOUS

## 2022-02-23 MED ORDER — ONDANSETRON HCL 4 MG/2ML IJ SOLN: 4.0000 mg | Freq: Four times a day (QID) | INTRAMUSCULAR | Status: DC | PRN

## 2022-02-23 MED ORDER — PHENYLEPHRINE 80 MCG/ML (10ML) SYRINGE FOR IV PUSH (FOR BLOOD PRESSURE SUPPORT)
PREFILLED_SYRINGE | INTRAVENOUS | Status: DC | PRN
  Administered 2022-02-23: 160 ug via INTRAVENOUS

## 2022-02-23 MED ORDER — LIDOCAINE 2% (20 MG/ML) 5 ML SYRINGE
INTRAMUSCULAR | Status: DC | PRN
  Administered 2022-02-23: 60 mg via INTRAVENOUS

## 2022-02-23 NOTE — Evaluation (Signed)
Physical Therapy Evaluation Patient Details Name: Kenneth Coleman MRN: [id number] DOB: [birth year] Today's Date: 02/23/2022  History of Present Illness  66 y.o. male  admitted 11/15 with Recurrent pneumonia. CTA at [facility] showed extensive endobronchial debris, with multiloculated left basilar pleural effusion associated with pleural thickening.  Patient also found to have spiculated mass in right upper lobe of lung.PMH:  COPD, chronic respiratory failure with hypoxia, CHF, hypertension, CAD, dysphagia, and chronic anemia  Clinical Impression  Pt admitted with above diagnosis. Pt limited as he was fatigued from bronchoscopy this am. Ambulated a short distance and is steady without device. May benefit from use of rollator as DOE 3/4 with short distance ambulation.  Will follow acutely.  Pt currently with functional limitations due to the deficits listed below (see PT Problem List). Pt will benefit from skilled PT to increase their independence and safety with mobility to allow discharge to the venue listed below.          Recommendations for follow up therapy are one component of a multi-disciplinary discharge planning process, led by the attending physician.  Recommendations may be updated based on patient status, additional functional criteria and insurance authorization.  Follow Up Recommendations Home health PT      Assistance Recommended at Discharge Intermittent Supervision/Assistance  Patient can return home with the following  A little help with walking and/or transfers;Assistance with cooking/housework;Assist for transportation;Help with stairs or ramp for entrance    Equipment Recommendations Rollator (4 wheels)  Recommendations for Other Services       Functional Status Assessment Patient has had a recent decline in their functional status and demonstrates the ability to make significant improvements in function in a reasonable and predictable amount of time.      Precautions / Restrictions Precautions Precautions: Fall Restrictions Weight Bearing Restrictions: No      Mobility  Bed Mobility Overal bed mobility: Independent                  Transfers Overall transfer level: Needs assistance Equipment used: None Transfers: Sit to/from Stand Sit to Stand: Supervision                Ambulation/Gait Ambulation/Gait assistance: Min guard Gait Distance (Feet): 8 Feet Assistive device: None, 1 person hand held assist Gait Pattern/deviations: Step-through pattern, Decreased stride length   Gait velocity interpretation: <1.31 ft/sec, indicative of household ambulator   General Gait Details: Pt took a few steps but did not want to walk much. DOE 3/4 with just a few steps with pt on 3LO2.  Sats 94% on O2.  Pt stated he will wlak more tomorrow as he had procedure earlier.  Stairs            Wheelchair Mobility    Modified Rankin (Stroke Patients Only)       Balance Overall balance assessment: Needs assistance Sitting-balance support: No upper extremity supported, Feet supported Sitting balance-Leahy Scale: Good     Standing balance support: No upper extremity supported, During functional activity Standing balance-Leahy Scale: Fair                               Pertinent Vitals/Pain Pain Assessment Pain Assessment: No/denies pain    Home Living Family/patient expects to be discharged to:: Private residence Living Arrangements: Alone Available Help at Discharge: Family;Available PRN/intermittently Type of Home: Mobile home Home Access: Ramped entrance       Home Layout: One level  Home Equipment: Rolling Walker (2 wheels);BSC/3in1;Cane - single point;Shower seat;Grab bars - tub/shower;Wheelchair - manual;Hospital bed (home O2 3L)      Prior Function               Mobility Comments: I PTA without equipment ADLs Comments: B/D self     Hand Dominance   Dominant Hand: Right     Extremity/Trunk Assessment   Upper Extremity Assessment Upper Extremity Assessment: Defer to OT evaluation    Lower Extremity Assessment Lower Extremity Assessment: Overall WFL for tasks assessed    Cervical / Trunk Assessment Cervical / Trunk Assessment: Normal  Communication   Communication: No difficulties  Cognition Arousal/Alertness: Awake/alert Behavior During Therapy: WFL for tasks assessed/performed Overall Cognitive Status: Within Functional Limits for tasks assessed                                 General Comments: Sttated [employer] and then [employer] Comments      Exercises     Assessment/Plan    PT Assessment Patient needs continued PT services  PT Problem List Decreased activity tolerance;Decreased balance;Decreased mobility;Decreased knowledge of use of DME;Decreased safety awareness;Decreased knowledge of precautions;Cardiopulmonary status limiting activity       PT Treatment Interventions DME instruction;Gait training;Functional mobility training;Therapeutic activities;Therapeutic exercise;Balance training;Patient/family education    PT Goals (Current goals can be found in the Care Plan section)  Acute Rehab PT Goals Patient Stated Goal: to go home PT Goal Formulation: With patient Time For Goal Achievement: 03/09/22 Potential to Achieve Goals: Good    Frequency Min 3X/week     Co-evaluation               AM-PAC PT "6 Clicks" Mobility  Outcome Measure Help needed turning from your back to your side while in a flat bed without using bedrails?: None Help needed moving from lying on your back to sitting on the side of a flat bed without using bedrails?: None Help needed moving to and from a bed to a chair (including a wheelchair)?: A Little Help needed standing up from a chair using your arms (e.g., wheelchair or bedside chair)?: A Little Help needed to walk in hospital room?: A Little Help  needed climbing 3-5 steps with a railing? : A Little 6 Click Score: 20    End of Session Equipment Utilized During Treatment: Gait belt;Oxygen Activity Tolerance: Patient limited by fatigue Patient left: in bed;with call bell/phone within reach Nurse Communication: Mobility status PT Visit Diagnosis: Muscle weakness (generalized) (M62.81)    Time: 0488-8916 PT Time Calculation (min) (ACUTE ONLY): 13 min   Charges:   PT Evaluation $PT Eval Moderate Complexity: 1 Mod          Zayan Delvecchio M,PT Acute Rehab Services [phone]   Alvira Philips 02/23/2022, 4:33 PM

## 2022-02-23 NOTE — Progress Notes (Signed)
Daily Progress Note   Patient Name: Kenneth Coleman       Date: 02/23/2022 DOB: [birth year]  Age: 66 y.o. MRN#: [id number] Attending Physician: Aileen Fass, Tammi Klippel, MD Primary Care Physician: System, Provider Not In Admit Date: 02/21/2022  Reason for Consultation/Follow-up: Establishing goals of care  Subjective: Feels well - thinks bronch went well - hungry and ordering large breakfast  Length of Stay: 2  Current Medications: Scheduled Meds:   carvedilol  3.125 mg Oral BID   digoxin  125 mcg Oral Daily   feeding supplement  237 mL Oral TID WC   pantoprazole  40 mg Oral Daily   umeclidinium-vilanterol  1 puff Inhalation Daily    Continuous Infusions:  sodium chloride     ampicillin-sulbactam (UNASYN) IV 3 g (02/23/22 0610)    PRN Meds: acetaminophen **OR** acetaminophen, albuterol, oxyCODONE  Physical Exam Constitutional:      General: He is not in acute distress.    Appearance: He is ill-appearing.  Pulmonary:     Effort: Pulmonary effort is normal.  Skin:    General: Skin is warm and dry.  Neurological:     Mental Status: He is alert and oriented to person, place, and time.             Vital Signs: BP 107/68 (BP Location: Right Arm)   Pulse 94   Temp 98.7 F (37.1 C) (Oral)   Resp 18   Ht 5\' 9"  (1.753 m)   Wt 56.7 kg   SpO2 100%   BMI 18.46 kg/m  SpO2: SpO2: 100 % O2 Device: O2 Device: Nasal Cannula O2 Flow Rate: O2 Flow Rate (L/min): 3 L/min  Intake/output summary:  Intake/Output Summary (Last 24 hours) at 02/23/2022 1105 Last data filed at 02/23/2022 8315 Gross per 24 hour  Intake 360 ml  Output 1110 ml  Net -750 ml   LBM: Last BM Date : 02/21/22 Baseline Weight: Weight: 63 kg Most recent weight: Weight: 56.7 kg       Palliative Assessment/Data:  PPS60%      Patient Active Problem List   Diagnosis Date Noted   Empyema ([place]) 02/21/2022   Aspiration pneumonia ([place]) 02/21/2022   Lung mass 02/21/2022   Normocytic anemia 02/21/2022   Chronic hypoxemic respiratory failure ([place]) 02/21/2022   Protein-calorie malnutrition, severe 01/23/2020   Acute esophagitis    Bile obstruction    Esophageal dysphagia    Lobar pneumonia ([place]) 01/17/2020   Acute on chronic respiratory failure with hypoxia ([place]) 01/16/2020   Choledocholithiasis 01/15/2020   Hypokalemia 01/15/2020   CHF (congestive heart failure) (HCC)    COPD (chronic obstructive pulmonary disease) (HCC)    Coronary artery disease    Hypertension     Palliative Care Assessment & Plan   HPI:  66 y.o. male  with past medical history of COPD, chronic respiratory failure with hypoxia, CHF, hypertension, CAD, dysphagia, and chronic anemia admitted on 02/21/2022 with Recurrent pneumonia. CTA at [facility] showed extensive endobronchial debris, with multiloculated left basilar pleural effusion associated with pleural thickening.  Patient also found to have spiculated mass in right upper lobe of lung.  PMT consulted to discuss goals of care.   Assessment: He continues  to be motivated to seek out ways to improve his current health - asking for regular SLP involvement as he wants to learn more about safe foods for him to eat and techniques to prevent aspiration.   Feels bronch went well.  Remains DNR/DNI.  Time for outcomes.  No questions or concerns.   Recommendations/Plan: Reached out to SLP to let know of patient's questions and request for more education Planning for FEES soon DNR/DNI PMT to follow up early next week  Code Status: DNR  Care plan was discussed with patient, SLP  Thank you for allowing the Palliative Medicine Team to assist in the care of this patient.   *Please note that this is a verbal dictation therefore any spelling or grammatical errors  are due to the "[place] [employer]" system interpretation.  Juel Burrow, DNP, [facility] Palliative Medicine Team Team Phone # [phone]  Pager [phone]

## 2022-02-23 NOTE — Anesthesia Preprocedure Evaluation (Signed)
Anesthesia Evaluation  Patient identified by MRN, date of birth, ID band Patient awake    Reviewed: Allergy & Precautions, H&P , NPO status , Patient's Chart, lab work & pertinent test results  Airway Mallampati: II   Neck ROM: full    Dental   Pulmonary COPD, Current Smoker   breath sounds clear to auscultation       Cardiovascular hypertension, + CAD and +CHF   Rhythm:regular Rate:Normal  TTE (02/21/22): EF 55%   Neuro/Psych    GI/Hepatic   Endo/Other    Renal/GU      Musculoskeletal   Abdominal   Peds  Hematology  (+) Blood dyscrasia, anemia   Anesthesia Other Findings   Reproductive/Obstetrics                             Anesthesia Physical Anesthesia Plan  ASA: 3  Anesthesia Plan: General   Post-op Pain Management:    Induction: Intravenous  PONV Risk Score and Plan: 1 and Ondansetron, Dexamethasone, Midazolam and Treatment may vary due to age or medical condition  Airway Management Planned: Oral ETT  Additional Equipment:   Intra-op Plan:   Post-operative Plan: Extubation in OR  Informed Consent: I have reviewed the patients History and Physical, chart, labs and discussed the procedure including the risks, benefits and alternatives for the proposed anesthesia with the patient or authorized representative who has indicated his/her understanding and acceptance.     Dental advisory given  Plan Discussed with: CRNA, Anesthesiologist and Surgeon  Anesthesia Plan Comments:        Anesthesia Quick Evaluation

## 2022-02-23 NOTE — Progress Notes (Signed)
PT Cancellation Note  Patient Details Name: Kenneth Coleman MRN: [id number] DOB: 1956-01-05   Cancelled Treatment:    Reason Eval/Treat Not Completed: Patient at procedure or test/unavailable (Pt in bronchoscopy currently. Will return as able.)   Alvira Philips 02/23/2022, 8:35 AM Kell Ferris M,PT Acute Rehab Services [phone]

## 2022-02-23 NOTE — Progress Notes (Signed)
This chaplain responded to PMT NP-Shae consult for spiritual care. The Pt. is sleeping at the time of the chaplain's visit.  The chaplain will attempt a revisit.  Chaplain Sallyanne Kuster [phone]

## 2022-02-23 NOTE — Progress Notes (Signed)
Mobility Specialist - Progress Note   02/23/22 1454  Mobility  Activity Ambulated independently in hallway  Level of Assistance Modified independent, requires aide device or extra time  Assistive Device Front wheel walker  Distance Ambulated (ft) 200 ft  Activity Response Tolerated well  $Mobility charge 1 Mobility    Pt received in bed agreeable to mobility. No complaints throughout, no physical assistance needed. Left sitting EOB w/ call bell in reach and all needs met.   [place] Specialist Please contact via SecureChat or Rehab office at [phone]

## 2022-02-23 NOTE — Transfer of Care (Signed)
Immediate Anesthesia Transfer of Care Note  Patient: Kenneth Coleman  Procedure(s) Performed: ROBOTIC ASSISTED NAVIGATIONAL BRONCHOSCOPY BRONCHIAL BRUSHINGS BRONCHIAL BIOPSIES BRONCHIAL NEEDLE ASPIRATION BIOPSIES VIDEO BRONCHOSCOPY WITH RADIAL ENDOBRONCHIAL ULTRASOUND BRONCHIAL WASHINGS  Patient Location: PACU  Anesthesia Type:General  Level of Consciousness: awake and alert   Airway & Oxygen Therapy: Patient Spontanous Breathing and Patient connected to face mask oxygen  Post-op Assessment: Report given to RN and Post -op Vital signs reviewed and stable  Post vital signs: Reviewed and stable  Last Vitals:  Vitals Value Taken Time  BP 95/64 02/23/22 0927  Temp    Pulse 90 02/23/22 0929  Resp 12 02/23/22 0929  SpO2 100 % 02/23/22 0929  Vitals shown include unvalidated device data.  Last Pain:  Vitals:   02/23/22 0752  TempSrc: Temporal  PainSc: 0-No pain         Complications: No notable events documented.

## 2022-02-23 NOTE — Anesthesia Postprocedure Evaluation (Signed)
Anesthesia Post Note  Patient: Kenneth Coleman  Procedure(s) Performed: ROBOTIC ASSISTED NAVIGATIONAL BRONCHOSCOPY BRONCHIAL BRUSHINGS BRONCHIAL BIOPSIES BRONCHIAL NEEDLE ASPIRATION BIOPSIES VIDEO BRONCHOSCOPY WITH RADIAL ENDOBRONCHIAL ULTRASOUND BRONCHIAL WASHINGS     Patient location during evaluation: PACU Anesthesia Type: General Level of consciousness: awake and alert, oriented and patient cooperative Pain management: pain level controlled Vital Signs Assessment: post-procedure vital signs reviewed and stable Respiratory status: spontaneous breathing, nonlabored ventilation and respiratory function stable Cardiovascular status: blood pressure returned to baseline and stable Postop Assessment: no apparent nausea or vomiting Anesthetic complications: no   No notable events documented.  Last Vitals:  Vitals:   02/23/22 0957 02/23/22 1023  BP: 102/65 107/68  Pulse: 92 94  Resp: (!) 23 18  Temp: 36.9 C 37.1 C  SpO2: 97% 100%    Last Pain:  Vitals:   02/23/22 1023  TempSrc: Oral  PainSc:                  Pervis Hocking

## 2022-02-23 NOTE — Progress Notes (Signed)
[facility] PROGRESS NOTE    Progress Note  Torrian Canion  UUV:[id number] DOB: 27-Sep-1955 DOA: 02/21/2022 PCP: System, Provider Not In     Brief Narrative:   Kenneth Coleman is an 66 y.o. male past medical history significant for chronic diastolic heart failure, COPD/chronic hypoxic respiratory failure on 2 L of oxygen at home, essential hypertension, recurrent pneumonias and pleural effusion requiring chest tube placement crying hospitalization comes in with worsening shortness of breath white count of 7 hemoglobin of 10.CTA at [facility] showed extensive endobronchial debris, with multiloculated left basilar pleural effusion associated with pleural thickening.   Assessment/Plan:   Complicated pleural effusion/aspiration pneumonia: Continue IV insulin. Speech evaluated the patient recommended regular thin liquid diet. Try to perform fees studies to assess pharynx. Consult palliative care for recurrent aspiration pneumonia  Incidental 2.2 cm spiculated mass within the right upper lobe: PCCM was consulted status post right upper lobe nodule bronchoscopy reported assisted biopsy. Further management per pulmonary.  Normocytic anemia: No signs of overt bleeding. Anemia panel showed ferritin of 50 saturation 10%. Will need iron supplementation.  Chronic diastolic heart failure: Appears euvolemic on physical exam.  COPD/chronic respiratory failure: Satting greater 90% on room air keep her on 2 to 3 L of oxygen.  CAD: Noted denies any symptoms.  Essential hypertension: Resume Coreg at a lower,   Hypokalemia: Replete orally recheck in the morning.  Severe protein caloric malnutrition: Patient is cachectic nutrition has been consulted.    DVT prophylaxis: lovenox Family Communication:none Status is: Inpatient Remains inpatient appropriate because: Empyema    Code Status:     Code Status Orders  (From admission, onward)           Start      Ordered   02/21/22 0348  Full code  Continuous        02/21/22 0354           Code Status History     Date Active Date Inactive Code Status Order ID Comments User Context   01/15/2020 1926 01/25/2020 2230 Full Code [id number]  [name], DO ED         IV Access:   Peripheral IV   Procedures and diagnostic studies:   DG Chest Port 1 View  Result Date: 02/23/2022 CLINICAL DATA:  Postop, bronchoscopy with biopsy EXAM: PORTABLE CHEST 1 VIEW COMPARISON:  Radiograph 02/21/2022 FINDINGS: Unchanged cardiomediastinal silhouette. Unchanged small loculated left pleural effusion. Unchanged bibasilar opacities. Post biopsy changes associated with known right medial right upper lobe pulmonary nodule. No evidence of pneumothorax. No acute osseous abnormality. Thoracic spondylosis. IMPRESSION: Post-biopsy changes in the right medial upper lung. No evidence of pneumothorax. Unchanged loculated left pleural effusion and bibasilar airspace disease. Electronically Signed   By: Maurine Simmering M.D.   On: 02/23/2022 10:00   DG C-ARM BRONCHOSCOPY  Result Date: 02/23/2022 C-ARM BRONCHOSCOPY: Fluoroscopy was utilized by the requesting physician.  No radiographic interpretation.   CT Super D Chest W Contrast  Result Date: 02/22/2022 CLINICAL DATA:  Right upper lobe spiculated pulmonary nodule EXAM: CT CHEST WITH CONTRAST TECHNIQUE: Multidetector CT imaging of the chest was performed using thin slice collimation for electromagnetic bronchoscopy planning purposes, with intravenous contrast. RADIATION DOSE REDUCTION: This exam was performed according to the departmental dose-optimization program which includes automated exposure control, adjustment of the mA and/or kV according to patient size and/or use of iterative reconstruction technique. CONTRAST:  4mL OMNIPAQUE IOHEXOL 350 MG/ML SOLN COMPARISON:  Chest CT dated February 17, 2022 FINDINGS: Cardiovascular:  Normal heart size. No pericardial effusion.  Normal caliber thoracic aorta with severe atherosclerotic disease. No visible coronary artery calcifications. Mediastinum/Nodes: Esophagus and thyroid are unremarkable. Mildly enlarged mediastinal lymph nodes, unchanged when compared with recent prior. Reference subcarinal lymph node measuring 1.4 cm in short axis on series 3, image 75 unchanged. Lungs/Pleura: Bilateral bronchial wall thickening scattered endobronchial debris. Moderate centrilobular emphysema. Spiculated juxtapleural solid nodule of the right upper lobe measuring 2.2 x 2.0 cm unchanged. Unchanged Peribronchovascular consolidations and nodularity of the bilateral lower lobes. A few new scattered small pulmonary nodules are visualized. Reference new solid nodule of the right upper lobe measuring 4 mm on series 3, image 50. Additional scattered small solid pulmonary nodules are unchanged when compared prior exam. Reference right upper lobe nodule measuring 3 mm series 3, image 64. Unchanged small left pleural effusion with loculated component of that demonstrates associated pleural thickening. Upper Abdomen: No acute abnormality. Musculoskeletal: No chest wall mass or suspicious bone lesions identified. IMPRESSION: 1. Spiculated solid nodule of the right upper lobe is unchanged in size when compared with recent prior exam and highly concerning for primary lung malignancy. 2. Endobronchial debris, peribronchovascular consolidations, and nodularity of the bilateral lower lobes, likely due to aspiration. 3. A few new scattered small pulmonary nodules are visualized, almost certainly sequela of infection or aspiration given rapid interval development 4. Stable mildly enlarged mediastinal lymph nodes, possibly reactive, although metastatic disease can not be excluded. 5. Unchanged small left pleural effusion with loculated component of that demonstrates associated pleural thickening, possibly due to chronicity, although empyema could also have this  appearance. 6. Aortic Atherosclerosis (ICD10-I70.0) and Emphysema (ICD10-J43.9). Electronically Signed   By: [name] M.D.   On: 02/22/2022 14:21   ECHOCARDIOGRAM COMPLETE  Result Date: 02/21/2022    ECHOCARDIOGRAM REPORT   Patient Name:   Kenneth Coleman Date of Exam: 02/21/2022 Medical Rec #:  [id number]      Height:       68.0 in Accession #:    [id number]     Weight:       138.9 lb Date of Birth:  Feb 20, 1956      BSA:          1.750 m Patient Age:    65 years       BP:           128/71 mmHg Patient Gender: M              HR:           81 bpm. Exam Location:  Inpatient Procedure: 2D Echo Indications:    CHF-acute diastolic  History:        Patient has prior history of Echocardiogram examinations, most                 recent 01/17/2020. CAD, COPD; Risk Factors:Current Smoker and                 Hypertension.  Sonographer:    Clayton Lefort RDCS (AE) Referring Phys: Shela Leff  Sonographer Comments: Image acquistion for apical right side challenging due to poor echo window. IMPRESSIONS  1. Left ventricular ejection fraction, by estimation, is 60 to 65%. The left ventricle has normal function. The left ventricle has no regional wall motion abnormalities. Left ventricular diastolic parameters are consistent with Grade I diastolic dysfunction (impaired relaxation).  2. Right ventricular systolic function is normal. The right ventricular size is normal.  3. The mitral valve is normal in structure. No  evidence of mitral valve regurgitation. No evidence of mitral stenosis.  4. The aortic valve is normal in structure. Aortic valve regurgitation is not visualized. No aortic stenosis is present.  5. The inferior vena cava is normal in size with greater than 50% respiratory variability, suggesting right atrial pressure of 3 mmHg. Comparison(s): Prior images reviewed side by side. FINDINGS  Left Ventricle: Left ventricular ejection fraction, by estimation, is 60 to 65%. The left ventricle has normal function.  The left ventricle has no regional wall motion abnormalities. The left ventricular internal cavity size was normal in size. There is  no left ventricular hypertrophy. Left ventricular diastolic parameters are consistent with Grade I diastolic dysfunction (impaired relaxation). Right Ventricle: The right ventricular size is normal. No increase in right ventricular wall thickness. Right ventricular systolic function is normal. Left Atrium: Left atrial size was normal in size. Right Atrium: Right atrial size was normal in size. Pericardium: There is no evidence of pericardial effusion. Mitral Valve: The mitral valve is normal in structure. No evidence of mitral valve regurgitation. No evidence of mitral valve stenosis. Tricuspid Valve: The tricuspid valve is normal in structure. Tricuspid valve regurgitation is not demonstrated. No evidence of tricuspid stenosis. Aortic Valve: The aortic valve is normal in structure. Aortic valve regurgitation is not visualized. No aortic stenosis is present. Aortic valve mean gradient measures 2.0 mmHg. Aortic valve peak gradient measures 4.7 mmHg. Aortic valve area, by VTI measures 3.19 cm. Pulmonic Valve: The pulmonic valve was normal in structure. Pulmonic valve regurgitation is not visualized. No evidence of pulmonic stenosis. Aorta: The aortic root is normal in size and structure. Venous: The inferior vena cava is normal in size with greater than 50% respiratory variability, suggesting right atrial pressure of 3 mmHg. IAS/Shunts: No atrial level shunt detected by color flow Doppler.  LEFT VENTRICLE PLAX 2D LVIDd:         4.10 cm LVIDs:         3.20 cm LV PW:         1.00 cm LV IVS:        0.80 cm LVOT diam:     2.10 cm LV SV:         60 LV SV Index:   34 LVOT Area:     3.46 cm  LEFT ATRIUM             Index LA diam:        2.60 cm 1.49 cm/m LA Vol (A2C):   34.3 ml 19.60 ml/m LA Vol (A4C):   30.1 ml 17.20 ml/m LA Biplane Vol: 33.7 ml 19.25 ml/m  AORTIC VALVE AV Area (Vmax):     3.75 cm AV Area (Vmean):   3.54 cm AV Area (VTI):     3.19 cm AV Vmax:           108.00 cm/s AV Vmean:          68.700 cm/s AV VTI:            0.187 m AV Peak Grad:      4.7 mmHg AV Mean Grad:      2.0 mmHg LVOT Vmax:         117.00 cm/s LVOT Vmean:        70.200 cm/s LVOT VTI:          0.172 m LVOT/AV VTI ratio: 0.92  AORTA Ao Root diam: 3.50 cm  SHUNTS Systemic VTI:  0.17 m Systemic Diam: 2.10 cm Candee Furbish MD  Electronically signed by Candee Furbish MD Signature Date/Time: 02/21/2022/4:43:00 PM    Final    DG Chest 2 View  Result Date: 02/21/2022 CLINICAL DATA:  Pneumonia and shortness of breath EXAM: CHEST - 2 VIEW COMPARISON:  Chest radiograph dated 01/16/2020, CTA chest dated 02/17/2022 FINDINGS: Normal lung volumes. Left lingular and lower lobe confluent and right basilar patchy opacities are again seen. Spiculated right upper lobe mass is better evaluated on prior CT. Left-greater-than-right small bilateral pleural effusions. No pneumothorax. The heart size and mediastinal contours are within normal limits. The visualized skeletal structures are unremarkable. IMPRESSION: 1. Left lingular and lower lobe confluent and right basilar patchy opacities are again seen, suspicious for pneumonia. 2. Spiculated right upper lobe mass is better evaluated on prior CT. 3. Small bilateral pleural effusions. Electronically Signed   By: Darrin Nipper M.D.   On: 02/21/2022 12:20     Medical Consultants:   None.   Subjective:    Clayborn Heron no complaints related he is hungry.  Objective:    Vitals:   02/23/22 0927 02/23/22 0942 02/23/22 0957 02/23/22 1023  BP: 95/64 (!) 109/57 102/65 107/68  Pulse: 80 95 92 94  Resp: 20 19 (!) 23 18  Temp: 98.3 F (36.8 C)  98.5 F (36.9 C) 98.7 F (37.1 C)  TempSrc:    Oral  SpO2: 100% 98% 97% 100%  Weight:      Height:       SpO2: 100 % O2 Flow Rate (L/min): 3 L/min   Intake/Output Summary (Last 24 hours) at 02/23/2022 1030 Last data filed at  02/23/2022 4944 Gross per 24 hour  Intake 360 ml  Output 1110 ml  Net -750 ml    Filed Weights   02/21/22 0400 02/23/22 0752  Weight: 63 kg 56.7 kg    Exam: General exam: In no acute distress, cachectic Respiratory system: Good air movement and clear to auscultation. Cardiovascular system: S1 & S2 heard, RRR. No JVD. Gastrointestinal system: Abdomen is nondistended, soft and nontender.  Extremities: No pedal edema. Skin: No rashes, lesions or ulcers Psychiatry: Judgement and insight appear normal. Mood & affect appropriate. Data Reviewed:    Labs: Basic Metabolic Panel: Recent Labs  Lab 02/21/22 0435 02/21/22 1213 02/22/22 0250  NA 145 143 142  K 3.3* 4.4 3.9  CL 100 99 97*  CO2 36* 34* 37*  GLUCOSE 91 85 105*  BUN <5* 7* 10  CREATININE 0.53* 0.44* 0.59*  CALCIUM 8.0* 8.6* 9.0    GFR Estimated Creatinine Clearance: 71.9 mL/min (A) (by C-G formula based on SCr of 0.59 mg/dL (L)). Liver Function Tests: Recent Labs  Lab 02/21/22 1213  AST 12*  ALT 10  ALKPHOS 54  BILITOT 0.5  PROT 5.9*  ALBUMIN 2.7*    No results for input(s): "LIPASE", "AMYLASE" in the last 168 hours. No results for input(s): "AMMONIA" in the last 168 hours. Coagulation profile Recent Labs  Lab 02/21/22 1213  INR 1.2    COVID-19 Labs  Recent Labs    02/21/22 0435  FERRITIN 50     Lab Results  Component Value Date   SARSCOV2NAA NEGATIVE 01/15/2020    CBC: Recent Labs  Lab 02/21/22 0442  WBC 8.1  HGB 9.4*  HCT 32.9*  MCV 87.5  PLT 277    Cardiac Enzymes: No results for input(s): "CKTOTAL", "CKMB", "CKMBINDEX", "TROPONINI" in the last 168 hours. BNP (last 3 results) No results for input(s): "PROBNP" in the last 8760 hours. CBG: No results for  input(s): "GLUCAP" in the last 168 hours. D-Dimer: No results for input(s): "DDIMER" in the last 72 hours. Hgb A1c: No results for input(s): "HGBA1C" in the last 72 hours. Lipid Profile: No results for input(s):  "CHOL", "HDL", "LDLCALC", "TRIG", "CHOLHDL", "LDLDIRECT" in the last 72 hours. Thyroid function studies: Recent Labs    02/21/22 1213  TSH 0.810    Anemia work up: Recent Labs    02/21/22 0435 02/21/22 0442  VITAMINB12 412  --   FOLATE 37.3  --   FERRITIN 50  --   TIBC 267  --   IRON 26*  --   RETICCTPCT  --  1.6    Sepsis Labs: Recent Labs  Lab 02/21/22 0442  WBC 8.1  LATICACIDVEN 1.4    Microbiology Recent Results (from the past 240 hour(s))  Culture, blood (Routine X 2) w Reflex to ID Panel     Status: None (Preliminary result)   Collection Time: 02/21/22  4:42 AM   Specimen: BLOOD RIGHT HAND  Result Value Ref Range Status   Specimen Description BLOOD RIGHT HAND  Final   Special Requests   Final    BOTTLES DRAWN AEROBIC AND ANAEROBIC Blood Culture adequate volume   Culture   Final    NO GROWTH 2 DAYS Performed at [facility], [place] [address], [place], [place] [address]    Report Status PENDING  Incomplete  Culture, blood (Routine X 2) w Reflex to ID Panel     Status: None (Preliminary result)   Collection Time: 02/21/22  4:42 AM   Specimen: BLOOD  Result Value Ref Range Status   Specimen Description BLOOD LEFT ANTECUBITAL  Final   Special Requests   Final    BOTTLES DRAWN AEROBIC AND ANAEROBIC Blood Culture adequate volume   Culture   Final    NO GROWTH 2 DAYS Performed at [facility], [place] [address], [place], [place] [address]    Report Status PENDING  Incomplete     Medications:    carvedilol  3.125 mg Oral BID   digoxin  125 mcg Oral Daily   feeding supplement  237 mL Oral TID WC   pantoprazole  40 mg Oral Daily   umeclidinium-vilanterol  1 puff Inhalation Daily   Continuous Infusions:  ampicillin-sulbactam (UNASYN) IV 3 g (02/23/22 0610)      LOS: 2 days   Charlynne Cousins  [facility]  02/23/2022, 10:30 AM

## 2022-02-23 NOTE — Op Note (Signed)
Video Bronchoscopy with Robotic Assisted Bronchoscopic Navigation   Date of Operation: 02/23/2022   Pre-op Diagnosis: Right upper lobe pulm nodule, recurrent left lower lobe pneumonia  Post-op Diagnosis: Same  Surgeon: Baltazar Apo  Assistants: None  Anesthesia: General endotracheal anesthesia  Operation: Flexible video fiberoptic bronchoscopy with robotic assistance and biopsies.  Estimated Blood Loss: Minimal  Complications: None  Indications and History: Kenneth Coleman is a 66 y.o. male with history of tobacco use, dysphagia with chronic aspiration pneumonias, prior left empyema.  He has persistent left basilar infiltrate, worrisome new spiculated right upper lobe pulmonary nodule.  Recommendation made to achieve tissue diagnosis, evaluate further by navigational bronchoscopy. The risks, benefits, complications, treatment options and expected outcomes were discussed with the patient.  The possibilities of pneumothorax, pneumonia, reaction to medication, pulmonary aspiration, perforation of a viscus, bleeding, failure to diagnose a condition and creating a complication requiring transfusion or operation were discussed with the patient who freely signed the consent.    Description of Procedure: The patient was seen in the Preoperative Area, was examined and was deemed appropriate to proceed.  The patient was taken to [facility] endoscopy room 3, identified as Kenneth Coleman and the procedure verified as Flexible Video Fiberoptic Bronchoscopy.  A Time Out was held and the above information confirmed.   Prior to the date of the procedure a high-resolution CT scan of the chest was performed. Utilizing ION software program a virtual tracheobronchial tree was generated to allow the creation of distinct navigation pathways to the patient's parenchymal abnormalities. After being taken to the operating room general anesthesia was initiated and the patient  was orally intubated. The video fiberoptic  bronchoscope was introduced via the endotracheal tube and a general inspection was performed.  Thick clear/white secretions were suctioned from all airways.  The left-sided airways were normal in appearance.  Right upper lobe right middle lobe airways were patent,.  There was exophytic mucosa, whitish endobronchial lesion noted right lower lobe superior segmental airway.  This lesion was brushed and biopsied to be sent for cytology.  Under fluoroscopic guidance random transbronchial brushings were performed in the left lower lobe in the region of his Chronic infiltrate.  Attention was then turned to the navigational bronchoscopy. Robotic catheter inserted into patient's endotracheal tube.   Target #1 right upper lobe nodule: The distinct navigation pathways prepared prior to this procedure were then utilized to navigate to patient's lesion identified on CT scan. The robotic catheter was secured into place and the vision probe was withdrawn.  Lesion location was approximated using fluoroscopy and radial endobronchial ultrasound for peripheral targeting.  Local registration and targeting was performed using Cios three-dimensional imaging.  Under fluoroscopic guidance transbronchial needle brushings, transbronchial needle biopsies, and transbronchial forceps biopsies were performed to be sent for cytology and pathology.  Using Cios, needle in lesion was confirmed.  A bronchioalveolar lavage was performed in the right upper lobe adjacent to the nodule and sent for cytology.  At the end of the procedure a general airway inspection was performed and there was no evidence of active bleeding. The bronchoscope was removed.  The patient tolerated the procedure well. There was no significant blood loss and there were no obvious complications. A post-procedural chest x-ray is pending.  Samples Target #1: 1. Transbronchial needle brushings from right upper lobe nodule 2. Transbronchial Wang needle biopsies from right  upper lobe nodule 3. Transbronchial forceps biopsies from right upper lobe nodule 4. Bronchoalveolar lavage from right upper lobe  Samples  1.  Transbronchial brushings from left lower lobe 2.  Endobronchial brushings superior segment right lower lobe 3.  Endobronchial forceps biopsy superior segment right lower lobe   Plans:  The patient will be transferred from the PACU to floor bed when recovered from anesthesia and after chest x-ray is reviewed. We will review the cytology, pathology results with the patient when they become available.    Baltazar Apo, MD, PhD 02/23/2022, 9:28 AM [facility] [phone] or if no answer before 7:00PM call [phone] For any issues after 7:00PM please call eLink [phone]

## 2022-02-23 NOTE — Interval H&P Note (Signed)
History and Physical Interval Note:  02/23/2022 7:45 AM  Kenneth Coleman  has presented today for surgery, with the diagnosis of right upper lobe mass.  The various methods of treatment have been discussed with the patient and family. After consideration of risks, benefits and other options for treatment, the patient has consented to  Procedure(s) with comments: ROBOTIC ASSISTED NAVIGATIONAL BRONCHOSCOPY (N/A) - robotic case, super D ordered as a surgical intervention.  The patient's history has been reviewed, patient examined, no change in status, stable for surgery.  I have reviewed the patient's chart and labs.  Questions were answered to the patient's satisfaction.     [name]

## 2022-02-24 DIAGNOSIS — J690 Pneumonitis due to inhalation of food and vomit: Secondary | ICD-10-CM | POA: Diagnosis not present

## 2022-02-24 DIAGNOSIS — R918 Other nonspecific abnormal finding of lung field: Secondary | ICD-10-CM | POA: Diagnosis not present

## 2022-02-24 DIAGNOSIS — J9611 Chronic respiratory failure with hypoxia: Secondary | ICD-10-CM | POA: Diagnosis not present

## 2022-02-24 DIAGNOSIS — I10 Essential (primary) hypertension: Secondary | ICD-10-CM | POA: Diagnosis not present

## 2022-02-24 NOTE — Progress Notes (Signed)
[facility] PROGRESS NOTE    Progress Note  Elester Apodaca  PJK:[id number] DOB: 01/16/1956 DOA: 02/21/2022 PCP: System, Provider Not In     Brief Narrative:   Jakeel Starliper is an 66 y.o. male past medical history significant for chronic diastolic heart failure, COPD/chronic hypoxic respiratory failure on 2 L of oxygen at home, essential hypertension, recurrent pneumonias and pleural effusion requiring chest tube placement crying hospitalization comes in with worsening shortness of breath white count of 7 hemoglobin of 10.CTA at [facility] showed extensive endobronchial debris, with multiloculated left basilar pleural effusion associated with pleural thickening.   Assessment/Plan:   Complicated pleural effusion/aspiration pneumonia: Transition on the biotics to orals. Speech evaluated the patient recommended regular thin liquid diet. Speech to perform fees studies. Consult palliative care for recurrent aspiration pneumonia  Incidental 2.2 cm spiculated mass within the right upper lobe: PCCM was consulted status post right upper lobe nodule bronchoscopy reported assisted biopsy. Further management per pulmonary.  Normocytic anemia: No signs of overt bleeding. Anemia panel showed ferritin of 50 saturation 10%. Will need iron supplementation.  Chronic diastolic heart failure: Appears euvolemic on physical exam.  COPD/chronic respiratory failure: Satting greater 90% on room air keep her on 2 to 3 L of oxygen.  CAD: Noted denies any symptoms.  Essential hypertension: Resume Coreg at a lower,   Hypokalemia: Replete orally recheck in the morning.  Severe protein caloric malnutrition: Patient is cachectic nutrition has been consulted.    DVT prophylaxis: lovenox Family Communication:none Status is: Inpatient Remains inpatient appropriate because: Empyema    Code Status:     Code Status Orders  (From admission, onward)           Start      Ordered   02/21/22 0348  Full code  Continuous        02/21/22 0354           Code Status History     Date Active Date Inactive Code Status Order ID Comments User Context   01/15/2020 1926 01/25/2020 2230 Full Code [id number]  [name], DO ED         IV Access:   Peripheral IV   Procedures and diagnostic studies:   DG Chest Port 1 View  Result Date: 02/23/2022 CLINICAL DATA:  Postop, bronchoscopy with biopsy EXAM: PORTABLE CHEST 1 VIEW COMPARISON:  Radiograph 02/21/2022 FINDINGS: Unchanged cardiomediastinal silhouette. Unchanged small loculated left pleural effusion. Unchanged bibasilar opacities. Post biopsy changes associated with known right medial right upper lobe pulmonary nodule. No evidence of pneumothorax. No acute osseous abnormality. Thoracic spondylosis. IMPRESSION: Post-biopsy changes in the right medial upper lung. No evidence of pneumothorax. Unchanged loculated left pleural effusion and bibasilar airspace disease. Electronically Signed   By: Maurine Simmering M.D.   On: 02/23/2022 10:00   DG C-ARM BRONCHOSCOPY  Result Date: 02/23/2022 C-ARM BRONCHOSCOPY: Fluoroscopy was utilized by the requesting physician.  No radiographic interpretation.   CT Super D Chest W Contrast  Result Date: 02/22/2022 CLINICAL DATA:  Right upper lobe spiculated pulmonary nodule EXAM: CT CHEST WITH CONTRAST TECHNIQUE: Multidetector CT imaging of the chest was performed using thin slice collimation for electromagnetic bronchoscopy planning purposes, with intravenous contrast. RADIATION DOSE REDUCTION: This exam was performed according to the departmental dose-optimization program which includes automated exposure control, adjustment of the mA and/or kV according to patient size and/or use of iterative reconstruction technique. CONTRAST:  14mL OMNIPAQUE IOHEXOL 350 MG/ML SOLN COMPARISON:  Chest CT dated February 17, 2022 FINDINGS: Cardiovascular:  Normal heart size. No pericardial effusion.  Normal caliber thoracic aorta with severe atherosclerotic disease. No visible coronary artery calcifications. Mediastinum/Nodes: Esophagus and thyroid are unremarkable. Mildly enlarged mediastinal lymph nodes, unchanged when compared with recent prior. Reference subcarinal lymph node measuring 1.4 cm in short axis on series 3, image 75 unchanged. Lungs/Pleura: Bilateral bronchial wall thickening scattered endobronchial debris. Moderate centrilobular emphysema. Spiculated juxtapleural solid nodule of the right upper lobe measuring 2.2 x 2.0 cm unchanged. Unchanged Peribronchovascular consolidations and nodularity of the bilateral lower lobes. A few new scattered small pulmonary nodules are visualized. Reference new solid nodule of the right upper lobe measuring 4 mm on series 3, image 50. Additional scattered small solid pulmonary nodules are unchanged when compared prior exam. Reference right upper lobe nodule measuring 3 mm series 3, image 64. Unchanged small left pleural effusion with loculated component of that demonstrates associated pleural thickening. Upper Abdomen: No acute abnormality. Musculoskeletal: No chest wall mass or suspicious bone lesions identified. IMPRESSION: 1. Spiculated solid nodule of the right upper lobe is unchanged in size when compared with recent prior exam and highly concerning for primary lung malignancy. 2. Endobronchial debris, peribronchovascular consolidations, and nodularity of the bilateral lower lobes, likely due to aspiration. 3. A few new scattered small pulmonary nodules are visualized, almost certainly sequela of infection or aspiration given rapid interval development 4. Stable mildly enlarged mediastinal lymph nodes, possibly reactive, although metastatic disease can not be excluded. 5. Unchanged small left pleural effusion with loculated component of that demonstrates associated pleural thickening, possibly due to chronicity, although empyema could also have this  appearance. 6. Aortic Atherosclerosis (ICD10-I70.0) and Emphysema (ICD10-J43.9). Electronically Signed   By: [name] M.D.   On: 02/22/2022 14:21     Medical Consultants:   None.   Subjective:    Kenneth Coleman no complaints  Objective:    Vitals:   02/23/22 2146 02/23/22 2318 02/24/22 0623 02/24/22 0726  BP: (!) 98/46 (!) 100/50 121/66 117/60  Pulse: (!) 56 65 75 (!) 51  Resp: 16 16 18 17   Temp: 97.7 F (36.5 C)   98.2 F (36.8 C)  TempSrc: Oral   Oral  SpO2: 100%  100% 100%  Weight:      Height:       SpO2: 100 % O2 Flow Rate (L/min): 3 L/min   Intake/Output Summary (Last 24 hours) at 02/24/2022 0829 Last data filed at 02/24/2022 0752 Gross per 24 hour  Intake 2214.17 ml  Output 1520 ml  Net 694.17 ml    Filed Weights   02/21/22 0400 02/23/22 0752  Weight: 63 kg 56.7 kg    Exam: General exam: In no acute distress. Respiratory system: Good air movement and clear to auscultation. Cardiovascular system: S1 & S2 heard, RRR. No JVD. Gastrointestinal system: Abdomen is nondistended, soft and nontender.  Extremities: No pedal edema. Skin: No rashes, lesions or ulcers Psychiatry: Judgement and insight appear normal. Mood & affect appropriate. Data Reviewed:    Labs: Basic Metabolic Panel: Recent Labs  Lab 02/21/22 0435 02/21/22 1213 02/22/22 0250  NA 145 143 142  K 3.3* 4.4 3.9  CL 100 99 97*  CO2 36* 34* 37*  GLUCOSE 91 85 105*  BUN <5* 7* 10  CREATININE 0.53* 0.44* 0.59*  CALCIUM 8.0* 8.6* 9.0    GFR Estimated Creatinine Clearance: 71.9 mL/min (A) (by C-G formula based on SCr of 0.59 mg/dL (L)). Liver Function Tests: Recent Labs  Lab 02/21/22 1213  AST 12*  ALT 10  ALKPHOS 54  BILITOT 0.5  PROT 5.9*  ALBUMIN 2.7*    No results for input(s): "LIPASE", "AMYLASE" in the last 168 hours. No results for input(s): "AMMONIA" in the last 168 hours. Coagulation profile Recent Labs  Lab 02/21/22 1213  INR 1.2    COVID-19  Labs  No results for input(s): "DDIMER", "FERRITIN", "LDH", "CRP" in the last 72 hours.   Lab Results  Component Value Date   SARSCOV2NAA NEGATIVE 01/15/2020    CBC: Recent Labs  Lab 02/21/22 0442  WBC 8.1  HGB 9.4*  HCT 32.9*  MCV 87.5  PLT 277    Cardiac Enzymes: No results for input(s): "CKTOTAL", "CKMB", "CKMBINDEX", "TROPONINI" in the last 168 hours. BNP (last 3 results) No results for input(s): "PROBNP" in the last 8760 hours. CBG: No results for input(s): "GLUCAP" in the last 168 hours. D-Dimer: No results for input(s): "DDIMER" in the last 72 hours. Hgb A1c: No results for input(s): "HGBA1C" in the last 72 hours. Lipid Profile: No results for input(s): "CHOL", "HDL", "LDLCALC", "TRIG", "CHOLHDL", "LDLDIRECT" in the last 72 hours. Thyroid function studies: Recent Labs    02/21/22 1213  TSH 0.810    Anemia work up: No results for input(s): "VITAMINB12", "FOLATE", "FERRITIN", "TIBC", "IRON", "RETICCTPCT" in the last 72 hours.  Sepsis Labs: Recent Labs  Lab 02/21/22 0442  WBC 8.1  LATICACIDVEN 1.4    Microbiology Recent Results (from the past 240 hour(s))  Culture, blood (Routine X 2) w Reflex to ID Panel     Status: None (Preliminary result)   Collection Time: 02/21/22  4:42 AM   Specimen: BLOOD RIGHT HAND  Result Value Ref Range Status   Specimen Description BLOOD RIGHT HAND  Final   Special Requests   Final    BOTTLES DRAWN AEROBIC AND ANAEROBIC Blood Culture adequate volume   Culture   Final    NO GROWTH 3 DAYS Performed at [facility], [place] [address], [place], [place] [address]    Report Status PENDING  Incomplete  Culture, blood (Routine X 2) w Reflex to ID Panel     Status: None (Preliminary result)   Collection Time: 02/21/22  4:42 AM   Specimen: BLOOD  Result Value Ref Range Status   Specimen Description BLOOD LEFT ANTECUBITAL  Final   Special Requests   Final    BOTTLES DRAWN AEROBIC AND ANAEROBIC Blood Culture adequate  volume   Culture   Final    NO GROWTH 3 DAYS Performed at [facility], [place] [address], [place], [place] [address]    Report Status PENDING  Incomplete     Medications:    carvedilol  3.125 mg Oral BID   digoxin  125 mcg Oral Daily   feeding supplement  237 mL Oral TID WC   pantoprazole  40 mg Oral Daily   umeclidinium-vilanterol  1 puff Inhalation Daily   Continuous Infusions:  sodium chloride 75 mL/hr at 02/23/22 2151   ampicillin-sulbactam (UNASYN) IV 3 g (02/24/22 9924)      LOS: 3 days   Charlynne Cousins  [facility]  02/24/2022, 8:29 AM

## 2022-02-24 NOTE — Progress Notes (Signed)
Pt BP is 112/51, HR- 48, held coreg and digoxin. MD aware. Will continue to monitor BP today.

## 2022-02-25 ENCOUNTER — Encounter (HOSPITAL_COMMUNITY): Payer: Self-pay | Admitting: Emergency Medicine

## 2022-02-25 DIAGNOSIS — J690 Pneumonitis due to inhalation of food and vomit: Secondary | ICD-10-CM | POA: Diagnosis not present

## 2022-02-25 NOTE — Progress Notes (Signed)
[facility] PROGRESS NOTE    Progress Note  Kenneth Coleman  QJF:[id number] DOB: [birth year] DOA: 02/21/2022 PCP: System, Provider Not In     Brief Narrative:   Kenneth Coleman is an 66 y.o. male past medical history significant for chronic diastolic heart failure, COPD/chronic hypoxic respiratory failure on 2 L of oxygen at home, essential hypertension, recurrent pneumonias and pleural effusion requiring chest tube placement crying hospitalization comes in with worsening shortness of breath white count of 7 hemoglobin of 10.CTA at [facility] showed extensive endobronchial debris, with multiloculated left basilar pleural effusion associated with pleural thickening.   Assessment/Plan:   Complicated pleural effusion/aspiration pneumonia: Now on oral antibiotics. Speech evaluated the patient recommended regular thin liquid diet. Awaiting the studies to be performed. Consult palliative care for recurrent aspiration pneumonia  Incidental 2.2 cm spiculated mass within the right upper lobe: PCCM was consulted status post right upper lobe nodule bronchoscopy reported assisted biopsy. We will need to follow-up with biopsy results of with pulmonary as an outpatient.  Normocytic anemia: No signs of overt bleeding. Anemia panel showed ferritin of 50 saturation 10%. Will need iron supplementation.  Chronic diastolic heart failure: Appears euvolemic on physical exam.  COPD/chronic respiratory failure: Satting greater 90% on room air keep her on 2 to 3 L of oxygen.  CAD: Noted denies any symptoms.  Essential hypertension: Resume Coreg at a lower,   Hypokalemia: Replete orally recheck in the morning.  Severe protein caloric malnutrition: Patient is cachectic nutrition has been consulted.    DVT prophylaxis: lovenox Family Communication:none Status is: Inpatient Remains inpatient appropriate because: Empyema    Code Status:     Code Status Orders  (From  admission, onward)           Start     Ordered   02/21/22 0348  Full code  Continuous        02/21/22 0354           Code Status History     Date Active Date Inactive Code Status Order ID Comments User Context   01/15/2020 1926 01/25/2020 2230 Full Code [id number]  [name], DO ED         IV Access:   Peripheral IV   Procedures and diagnostic studies:   DG Chest Port 1 View  Result Date: 02/23/2022 CLINICAL DATA:  Postop, bronchoscopy with biopsy EXAM: PORTABLE CHEST 1 VIEW COMPARISON:  Radiograph 02/21/2022 FINDINGS: Unchanged cardiomediastinal silhouette. Unchanged small loculated left pleural effusion. Unchanged bibasilar opacities. Post biopsy changes associated with known right medial right upper lobe pulmonary nodule. No evidence of pneumothorax. No acute osseous abnormality. Thoracic spondylosis. IMPRESSION: Post-biopsy changes in the right medial upper lung. No evidence of pneumothorax. Unchanged loculated left pleural effusion and bibasilar airspace disease. Electronically Signed   By: Maurine Simmering M.D.   On: 02/23/2022 10:00   DG C-ARM BRONCHOSCOPY  Result Date: 02/23/2022 C-ARM BRONCHOSCOPY: Fluoroscopy was utilized by the requesting physician.  No radiographic interpretation.     Medical Consultants:   None.   Subjective:    Kenneth Coleman no is tolerating his diet.  Objective:    Vitals:   02/24/22 1600 02/24/22 2115 02/25/22 0628 02/25/22 0834  BP: (!) 123/58 108/61 126/60 (!) 130/57  Pulse: 84 90 76 79  Resp: 17 16  17   Temp: 97.8 F (36.6 C) 98.1 F (36.7 C) 97.7 F (36.5 C) 98.2 F (36.8 C)  TempSrc: Oral Oral Oral   SpO2: 99% 98% 100% 100%  Weight:  Height:       SpO2: 100 % O2 Flow Rate (L/min): 3 L/min   Intake/Output Summary (Last 24 hours) at 02/25/2022 0901 Last data filed at 02/24/2022 2235 Gross per 24 hour  Intake 1174.34 ml  Output 200 ml  Net 974.34 ml    Filed Weights   02/21/22 0400 02/23/22  0752  Weight: 63 kg 56.7 kg    Exam: General exam: In no acute distress. Respiratory system: Good air movement and clear to auscultation. Cardiovascular system: S1 & S2 heard, RRR. No JVD. Gastrointestinal system: Abdomen is nondistended, soft and nontender.  Extremities: No pedal edema. Skin: No rashes, lesions or ulcers Psychiatry: Judgement and insight appear normal. Mood & affect appropriate. Data Reviewed:    Labs: Basic Metabolic Panel: Recent Labs  Lab 02/21/22 0435 02/21/22 1213 02/22/22 0250  NA 145 143 142  K 3.3* 4.4 3.9  CL 100 99 97*  CO2 36* 34* 37*  GLUCOSE 91 85 105*  BUN <5* 7* 10  CREATININE 0.53* 0.44* 0.59*  CALCIUM 8.0* 8.6* 9.0    GFR Estimated Creatinine Clearance: 71.9 mL/min (A) (by C-G formula based on SCr of 0.59 mg/dL (L)). Liver Function Tests: Recent Labs  Lab 02/21/22 1213  AST 12*  ALT 10  ALKPHOS 54  BILITOT 0.5  PROT 5.9*  ALBUMIN 2.7*    No results for input(s): "LIPASE", "AMYLASE" in the last 168 hours. No results for input(s): "AMMONIA" in the last 168 hours. Coagulation profile Recent Labs  Lab 02/21/22 1213  INR 1.2    COVID-19 Labs  No results for input(s): "DDIMER", "FERRITIN", "LDH", "CRP" in the last 72 hours.   Lab Results  Component Value Date   SARSCOV2NAA NEGATIVE 01/15/2020    CBC: Recent Labs  Lab 02/21/22 0442  WBC 8.1  HGB 9.4*  HCT 32.9*  MCV 87.5  PLT 277    Cardiac Enzymes: No results for input(s): "CKTOTAL", "CKMB", "CKMBINDEX", "TROPONINI" in the last 168 hours. BNP (last 3 results) No results for input(s): "PROBNP" in the last 8760 hours. CBG: No results for input(s): "GLUCAP" in the last 168 hours. D-Dimer: No results for input(s): "DDIMER" in the last 72 hours. Hgb A1c: No results for input(s): "HGBA1C" in the last 72 hours. Lipid Profile: No results for input(s): "CHOL", "HDL", "LDLCALC", "TRIG", "CHOLHDL", "LDLDIRECT" in the last 72 hours. Thyroid function studies: No  results for input(s): "TSH", "T4TOTAL", "T3FREE", "THYROIDAB" in the last 72 hours.  Invalid input(s): "FREET3"  Anemia work up: No results for input(s): "VITAMINB12", "FOLATE", "FERRITIN", "TIBC", "IRON", "RETICCTPCT" in the last 72 hours.  Sepsis Labs: Recent Labs  Lab 02/21/22 0442  WBC 8.1  LATICACIDVEN 1.4    Microbiology Recent Results (from the past 240 hour(s))  Culture, blood (Routine X 2) w Reflex to ID Panel     Status: None (Preliminary result)   Collection Time: 02/21/22  4:42 AM   Specimen: BLOOD RIGHT HAND  Result Value Ref Range Status   Specimen Description BLOOD RIGHT HAND  Final   Special Requests   Final    BOTTLES DRAWN AEROBIC AND ANAEROBIC Blood Culture adequate volume   Culture   Final    NO GROWTH 4 DAYS Performed at [facility], [place] [address], [place], [place] [address]    Report Status PENDING  Incomplete  Culture, blood (Routine X 2) w Reflex to ID Panel     Status: None (Preliminary result)   Collection Time: 02/21/22  4:42 AM  Specimen: BLOOD  Result Value Ref Range Status   Specimen Description BLOOD LEFT ANTECUBITAL  Final   Special Requests   Final    BOTTLES DRAWN AEROBIC AND ANAEROBIC Blood Culture adequate volume   Culture   Final    NO GROWTH 4 DAYS Performed at [facility], [address], [place], [place] [address]    Report Status PENDING  Incomplete     Medications:    carvedilol  3.125 mg Oral BID   digoxin  125 mcg Oral Daily   feeding supplement  237 mL Oral TID WC   pantoprazole  40 mg Oral Daily   umeclidinium-vilanterol  1 puff Inhalation Daily   Continuous Infusions:  ampicillin-sulbactam (UNASYN) IV 3 g (02/25/22 0636)      LOS: 4 days   Charlynne Cousins  [facility]  02/25/2022, 9:01 AM

## 2022-02-25 NOTE — Progress Notes (Addendum)
Mobility Specialist Progress Note:   02/25/22 1356  Mobility  Activity Ambulated with assistance in hallway  Level of Assistance Modified independent, requires aide device or extra time  Assistive Device Front wheel walker  Distance Ambulated (ft) 200 ft  Activity Response Tolerated well  $Mobility charge 1 Mobility   Pt received in bed wiling to participate in mobility. No complaint of pain. Left in bed with call bell in reach and all needs met.  Kenneth Coleman Mobility Specialist Please contact via Secure Chat or  Rehab Office at [phone]  

## 2022-02-26 DIAGNOSIS — J690 Pneumonitis due to inhalation of food and vomit: Secondary | ICD-10-CM | POA: Diagnosis not present

## 2022-02-26 LAB — CULTURE, BLOOD (ROUTINE X 2)
Culture: NO GROWTH
Culture: NO GROWTH
Special Requests: ADEQUATE
Special Requests: ADEQUATE

## 2022-02-26 MED ORDER — AMOXICILLIN-POT CLAVULANATE 875-125 MG PO TABS
1.0000 | ORAL_TABLET | Freq: Two times a day (BID) | ORAL | Status: AC
  Administered 2022-02-26 (×2): 1 via ORAL
  Filled 2022-02-26 (×2): qty 1

## 2022-02-26 MED ORDER — MELATONIN 3 MG PO TABS
3.0000 mg | ORAL_TABLET | Freq: Every day | ORAL | Status: AC
  Administered 2022-02-26 – 2022-02-27 (×2): 3 mg via ORAL
  Filled 2022-02-26 (×2): qty 1

## 2022-02-26 NOTE — Progress Notes (Signed)
Physical Therapy Treatment Patient Details Name: Kenneth Coleman MRN: [id number] DOB: [birth year] Today's Date: 02/26/2022   History of Present Illness 66 y.o. male  admitted 11/15 with Recurrent pneumonia. CTA at [facility] showed extensive endobronchial debris, with multiloculated left basilar pleural effusion associated with pleural thickening.  Patient also found to have spiculated mass in right upper lobe of lung.PMH:  COPD, chronic respiratory failure with hypoxia, CHF, hypertension, CAD, dysphagia, and chronic anemia    PT Comments    Pt received in recliner, asking for assist back to bed given O2 lines and fall risk, nursing staff busy so PTA assisted with short distance gait task in room and pt agreeable to additional instruction on seated exercises and able to demo back use of flutter valve. VSS on 3L O2 [place]. Pt continues to benefit from PT services to progress toward functional mobility goals.    Recommendations for follow up therapy are one component of a multi-disciplinary discharge planning process, led by the attending physician.  Recommendations may be updated based on patient status, additional functional criteria and insurance authorization.  Follow Up Recommendations  Home health PT     Assistance Recommended at Discharge Intermittent Supervision/Assistance  Patient can return home with the following A little help with walking and/or transfers;Assistance with cooking/housework;Assist for transportation;Help with stairs or ramp for entrance   Equipment Recommendations  Rollator (4 wheels)    Recommendations for Other Services       Precautions / Restrictions Precautions Precautions: Fall Restrictions Weight Bearing Restrictions: No     Mobility  Bed Mobility               General bed mobility comments: Pt reports wanting to sit edge of bed "for a bit" prior to returning to supine, defers any assist needed.    Transfers Overall transfer level: Needs  assistance Equipment used: None Transfers: Sit to/from Stand Sit to Stand: Supervision           General transfer comment: Supervision for safety without AD    Ambulation/Gait Ambulation/Gait assistance: Min guard Gait Distance (Feet): 20 Feet Assistive device: None Gait Pattern/deviations: Step-through pattern, Decreased stride length       General Gait Details: pt using furniture intermittently for support, distance limited due to pt fatigue wanting to return to bed.       Balance Overall balance assessment: Needs assistance Sitting-balance support: No upper extremity supported, Feet supported Sitting balance-Leahy Scale: Good     Standing balance support: No upper extremity supported, During functional activity Standing balance-Leahy Scale: Fair Standing balance comment: no LOB wtihout AD but pt reaching for furniture intermittently                            Cognition Arousal/Alertness: Awake/alert Behavior During Therapy: WFL for tasks assessed/performed Overall Cognitive Status: Within Functional Limits for tasks assessed                                          Exercises Other Exercises Other Exercises: seated BLE AROM: ankle pumps, hip flexion, LAQ x5-10 reps ea, also reviewed chair push-ups and reciprocal sit to stands for strengthening but pt too fatigued to perform other exercises after initial Other Exercises: flutter valve x10 reps, cues for upright posture    General Comments General comments (skin integrity, edema, etc.): SpO2 WFL on 3L, pt  1-2/10 DOE overall with mobility tasks; pt reports 7/10 modified RPE after first session and about the same once done in second session      Pertinent Vitals/Pain Pain Assessment Pain Assessment: No/denies pain     PT Goals (current goals can now be found in the care plan section) Acute Rehab PT Goals Patient Stated Goal: to go home PT Goal Formulation: With patient Time For  Goal Achievement: 03/09/22 Potential to Achieve Goals: Good Progress towards PT goals: Progressing toward goals    Frequency    Min 3X/week      PT Plan Current plan remains appropriate       AM-PAC PT "6 Clicks" Mobility   Outcome Measure  Help needed turning from your back to your side while in a flat bed without using bedrails?: None Help needed moving from lying on your back to sitting on the side of a flat bed without using bedrails?: None Help needed moving to and from a bed to a chair (including a wheelchair)?: A Little Help needed standing up from a chair using your arms (e.g., wheelchair or bedside chair)?: A Little Help needed to walk in hospital room?: A Little Help needed climbing 3-5 steps with a railing? : A Little 6 Click Score: 20    End of Session Equipment Utilized During Treatment: Gait belt;Oxygen Activity Tolerance: Patient tolerated treatment well;Patient limited by fatigue Patient left: with call bell/phone within reach;in bed Nurse Communication: Mobility status PT Visit Diagnosis: Muscle weakness (generalized) (M62.81)     Time: 2119-4174 PT Time Calculation (min) (ACUTE ONLY): 9 min  Charges:  $Gait Training: 23-37 mins $Therapeutic Exercise: 8-22 mins                     Rex Oesterle P., PTA Acute Rehabilitation Services Secure Chat Preferred 9a-5:30pm Office: [place] 02/26/2022, 7:08 PM

## 2022-02-26 NOTE — Progress Notes (Signed)
This chaplain is present for spiritual care as consulted by PMT. The Pt. is awake and accepting of the chaplain visit.   The chaplain listens reflectively as the Pt. describes the bronchoscopy and swallow evaluation as part of the necessary procedures before his anticipated discharge home. The Pt. leans on his faith and prayer as a place of hope in his goal to return home. The chaplain understands the Pt.  daughter is available to assist with care at home.  The visit ended with prayer, right before lunch.  This chaplain is available for F/U spiritual care as needed.  Chaplain Sallyanne Kuster [phone]

## 2022-02-26 NOTE — Progress Notes (Signed)
Mobility Specialist Progress Note   02/26/22 1005  Mobility  Activity Ambulated with assistance in hallway;Dangled on edge of bed  Level of Assistance Modified independent, requires aide device or extra time  Assistive Device Front wheel walker  Distance Ambulated (ft) 200 ft  Range of Motion/Exercises Active;All extremities  Activity Response Tolerated well   Pre Mobility:  SpO2 99% 3LO2 During Mobility: SpO2 95% 3LO2 Post Mobility: SpO2 98% 3LO2  Patient received in supine agreeable to participate in mobility. Ambulated with slow but steady gait. Tolerated well without any complaints of SOB. Oxygen saturation maintained >95% on 3LO2. Was left dangling EOB with all needs met, call bell in reach.   Kenneth Coleman, BS EXP Mobility Specialist Please contact via SecureChat or Rehab office at [phone]

## 2022-02-26 NOTE — TOC Initial Note (Addendum)
Transition of Care (TOC) - Initial/Assessment Note  Patient fromhome with family.   Has PCP but does not remember PCP name or practice name , but has information at home and will schedule a follow up appointment.   Has transportation to appointments.   Has home oxygen through [place]. Family will bring portable oxygen DME at discharge.   Patient active with [facility] for [facility] and [place]. Patient would like to continue services. Confirmed with Kenneth Coleman with [facility] , she will need resumption of care orders. Same entered and secure chatted MD to sign  Added HHSP , [facility] accepted  Patient Details  Name: Kenneth Coleman MRN: [id number] Date of Birth: 08-Feb-1956  Transition of Care [facility]) CM/SW Contact:    Kenneth Favre, RN Phone Number: 02/26/2022, 12:51 PM  Clinical Narrative:                   Expected Discharge Plan: [place] Barriers to Discharge: Continued Medical Work up   Patient Goals and CMS Choice Patient states their goals for this hospitalization and ongoing recovery are:: to return to home CMS Medicare.gov Compare Post Acute Care list provided to:: Patient Choice offered to / list presented to : Patient  Expected Discharge Plan and Services Expected Discharge Plan: [place]   Discharge Planning Services: CM Consult Post Acute Care Choice: [place] arrangements for the past 2 months: Single Family Home                 DME Arranged: N/A         HH Arranged: RN, PT [place] Agency: [place] Date [place]: 02/26/22 Time [place]: 1243 Representative spoke with at [place]: [place] Arrangements/Services Living arrangements for the past 2 months: [place] Lives with:: Relatives Patient language and need for interpreter reviewed:: Yes Do you feel safe going back to the place where you live?: Yes      Need for Family Participation in Patient Care: Yes  (Comment) Care giver support system in place?: Yes (comment) Current home services: DME Criminal Activity/Legal Involvement Pertinent to Current Situation/Hospitalization: No - Comment as needed  Activities of Daily Living      Permission Sought/Granted   Permission granted to share information with : No              Emotional Assessment Appearance:: Appears stated age Attitude/Demeanor/Rapport: Engaged Affect (typically observed): Accepting Orientation: : Oriented to Self, Oriented to Place, Oriented to  Time, Oriented to Situation Alcohol / Substance Use: Not Applicable Psych Involvement: No (comment)  Admission diagnosis:  Empyema ([place]) [J86.9] Patient Active Problem List   Diagnosis Date Noted   Empyema ([place]) 02/21/2022   Aspiration pneumonia ([place]) 02/21/2022   Lung mass 02/21/2022   Normocytic anemia 02/21/2022   Chronic hypoxemic respiratory failure ([place]) 02/21/2022   Protein-calorie malnutrition, severe 01/23/2020   Acute esophagitis    Bile obstruction    Esophageal dysphagia    Lobar pneumonia ([place]) 01/17/2020   Acute on chronic respiratory failure with hypoxia ([place]) 01/16/2020   Choledocholithiasis 01/15/2020   Hypokalemia 01/15/2020   CHF (congestive heart failure) (HCC)    COPD (chronic obstructive pulmonary disease) ([place])    Coronary artery disease    Hypertension    PCP:  System, Provider Not In Pharmacy:   [place] [address] Phone: [phone]      Social Determinants of Health (  SDOH) Interventions    Readmission Risk Interventions     No data to display

## 2022-02-26 NOTE — Care Management Important Message (Signed)
Important Message  Patient Details  Name: Kenneth Coleman MRN: [id number] Date of Birth: 29-Jul-1955   Medicare Important Message Given:  Yes     Hannah Beat 02/26/2022, 3:15 PM

## 2022-02-26 NOTE — Progress Notes (Signed)
Physical Therapy Treatment Patient Details Name: Kenneth Coleman MRN: [id number] DOB: [birth year] Today's Date: 02/26/2022   History of Present Illness 66 y.o. male  admitted 11/15 with Recurrent pneumonia. [facility] showed extensive endobronchial debris, with multiloculated left basilar pleural effusion associated with pleural thickening.  Patient also found to have spiculated mass in right upper lobe of lung.PMH:  COPD, chronic respiratory failure with hypoxia, CHF, hypertension, CAD, dysphagia, and chronic anemia    PT Comments    Pt received seated EOB, agreeable to therapy session for gait progression with rollator and stair ascent/descent for BLE strengthening. Pt needing up to min guard for stepping up to 7" step but mostly Supervision for functional mobility tasks using rollator. Pt would benefit from rollator upon DC for energy conservation, especially given 3L O2 [place]. Pt continues to benefit from PT services to progress toward functional mobility goals.   Recommendations for follow up therapy are one component of a multi-disciplinary discharge planning process, led by the attending physician.  Recommendations may be updated based on patient status, additional functional criteria and insurance authorization.  Follow Up Recommendations  Home health PT     Assistance Recommended at Discharge Intermittent Supervision/Assistance  Patient can return home with the following A little help with walking and/or transfers;Assistance with cooking/housework;Assist for transportation;Help with stairs or ramp for entrance   Equipment Recommendations  Rollator (4 wheels)    Recommendations for Other Services       Precautions / Restrictions Precautions Precautions: Fall Restrictions Weight Bearing Restrictions: No     Mobility  Bed Mobility               General bed mobility comments: Received seated EOB    Transfers Overall transfer level: Needs  assistance Equipment used: Rollator (4 wheels) Transfers: Sit to/from Stand Sit to Stand: Supervision           General transfer comment: cues for use of brakes/hand placement when using 4WW    Ambulation/Gait Ambulation/Gait assistance: Supervision Gait Distance (Feet): 150 Feet (x2 with seated break after stairs) Assistive device: Rollator (4 wheels) Gait Pattern/deviations: Step-through pattern, Decreased stride length       General Gait Details: Pt able to go much further using rollator this date, no LOB and steady with light UE reliance on AD. DOE 2/4 with exertion on 3LO2, SpO2 94% and HR 93-101 bpm with gait.   Stairs Stairs: Yes Stairs assistance: Min guard Stair Management: One rail Left, Step to pattern, Forwards Number of Stairs: 5 General stair comments: single 7" step up/down in stairwell x5 reps for BLE strengthening, no LOB, seated recovery break afterward with dyspnea 2-3/4 on exertion.   Wheelchair Mobility    Modified Rankin (Stroke Patients Only)       Balance Overall balance assessment: Needs assistance Sitting-balance support: No upper extremity supported, Feet supported Sitting balance-Leahy Scale: Good     Standing balance support: No upper extremity supported, During functional activity Standing balance-Leahy Scale: Fair Standing balance comment: rollator more for energy conservation than due to balance                            Cognition Arousal/Alertness: Awake/alert Behavior During Therapy: WFL for tasks assessed/performed Overall Cognitive Status: Within Functional Limits for tasks assessed  General Comments General comments (skin integrity, edema, etc.): SpO2 WFL on 3L [place], HR WFL; no dizziness reported      Pertinent Vitals/Pain Pain Assessment Pain Assessment: No/denies pain     PT Goals (current goals can now be found in the care plan section) Acute  Rehab PT Goals Patient Stated Goal: to go home PT Goal Formulation: With patient Time For Goal Achievement: 03/09/22 Potential to Achieve Goals: Good Progress towards PT goals: Progressing toward goals    Frequency    Min 3X/week      PT Plan Current plan remains appropriate       AM-PAC PT "6 Clicks" Mobility   Outcome Measure  Help needed turning from your back to your side while in a flat bed without using bedrails?: None Help needed moving from lying on your back to sitting on the side of a flat bed without using bedrails?: None Help needed moving to and from a bed to a chair (including a wheelchair)?: A Little Help needed standing up from a chair using your arms (e.g., wheelchair or bedside chair)?: A Little Help needed to walk in hospital room?: A Little Help needed climbing 3-5 steps with a railing? : A Little 6 Click Score: 20    End of Session Equipment Utilized During Treatment: Gait belt;Oxygen Activity Tolerance: Patient tolerated treatment well Patient left: with call bell/phone within reach;in chair Nurse Communication: Mobility status;Other (comment) (pt needs new sheets on his bed) PT Visit Diagnosis: Muscle weakness (generalized) (M62.81)     Time: 1710-1739 PT Time Calculation (min) (ACUTE ONLY): 29 min  Charges:  $Gait Training: 23-37 mins                     Sebastian Lurz P., PTA Acute Rehabilitation Services Secure Chat Preferred 9a-5:30pm Office: [place] 02/26/2022, 6:43 PM

## 2022-02-26 NOTE — Procedures (Signed)
Objective Swallowing Evaluation: Type of Study: FEES-Fiberoptic Endoscopic Evaluation of Swallow   Patient Details  Name: Kenneth Coleman MRN: [id number] Date of Birth: [birth year]  Today's Date: 02/26/2022 Time: SLP Start Time (ACUTE ONLY): 1140 -SLP Stop Time (ACUTE ONLY): 1210  SLP Time Calculation (min) (ACUTE ONLY): 30 min   Past Medical History:  Past Medical History:  Diagnosis Date   CHF (congestive heart failure) (HCC)    COPD (chronic obstructive pulmonary disease) ([place])    Coronary artery disease    Hypertension    Past Surgical History:  Past Surgical History:  Procedure Laterality Date   BIOPSY  01/16/2020   Procedure: BIOPSY;  Surgeon: Harvel Quale, MD;  [facility];  Service: Gastroenterology;;  esophageal biopsies @25 ,37,20   BRONCHIAL BIOPSY  02/23/2022   Procedure: BRONCHIAL BIOPSIES;  Surgeon: [name], MD;  Location: [facility] ENDOSCOPY;  Service: Pulmonary;;   BRONCHIAL BRUSHINGS  02/23/2022   Procedure: BRONCHIAL BRUSHINGS;  Surgeon: [name], MD;  Location[facility] ENDOSCOPY;  Service: Pulmonary;;   BRONCHIAL NEEDLE ASPIRATION BIOPSY  02/23/2022   Procedure: BRONCHIAL NEEDLE ASPIRATION BIOPSIES;  Surgeon: [name], MD;  Location: [place];  Service: Pulmonary;;   BRONCHIAL WASHINGS  02/23/2022   Procedure: BRONCHIAL WASHINGS;  Surgeon: [name], MD;  Location: [facility] ENDOSCOPY;  Service: Pulmonary;;   CHOLECYSTECTOMY Bilateral 01/21/2020   Procedure: LAPAROSCOPIC CHOLECYSTECTOMY;  Surgeon: Greer Pickerel, MD;  Location: [place];  Service: General;  Laterality: Bilateral;   ENDOSCOPIC RETROGRADE CHOLANGIOPANCREATOGRAPHY (ERCP) WITH PROPOFOL N/A 01/20/2020   Procedure: ENDOSCOPIC RETROGRADE CHOLANGIOPANCREATOGRAPHY (ERCP) WITH PROPOFOL;  Surgeon: Irving Copas., MD;  Location: [place];  Service: Gastroenterology;  Laterality: N/A;   ESOPHAGOGASTRODUODENOSCOPY (EGD) WITH PROPOFOL N/A 01/16/2020   Procedure:  ESOPHAGOGASTRODUODENOSCOPY (EGD) WITH PROPOFOL;  Surgeon: Harvel Quale, MD;  [facility];  Service: Gastroenterology;  Laterality: N/A;   ESOPHAGOGASTRODUODENOSCOPY (EGD) WITH PROPOFOL N/A 01/20/2020   Procedure: ESOPHAGOGASTRODUODENOSCOPY (EGD) WITH PROPOFOL;  Surgeon: Rush Landmark Telford Nab., MD;  Location: [place];  Service: Gastroenterology;  Laterality: N/A;   HEMOSTASIS CLIP PLACEMENT  01/20/2020   Procedure: HEMOSTASIS CLIP PLACEMENT;  Surgeon: Irving Copas., MD;  Location: [place];  Service: Gastroenterology;;   HOT HEMOSTASIS N/A 01/20/2020   Procedure: HOT HEMOSTASIS (ARGON PLASMA COAGULATION/BICAP);  Surgeon: Irving Copas., MD;  Location: [place];  Service: Gastroenterology;  Laterality: N/A;   REMOVAL OF STONES  01/20/2020   Procedure: REMOVAL OF STONES;  Surgeon: Mansouraty, Telford Nab., MD;  Location: [place];  Service: Gastroenterology;;   Lavell Islam REMOVAL  01/20/2020   Procedure: STENT REMOVAL;  Surgeon: Irving Copas., MD;  Location: [place];  Service: Gastroenterology;;   VIDEO BRONCHOSCOPY WITH RADIAL ENDOBRONCHIAL ULTRASOUND  02/23/2022   Procedure: VIDEO BRONCHOSCOPY WITH RADIAL ENDOBRONCHIAL ULTRASOUND;  Surgeon: [name], MD;  Location: [facility] ENDOSCOPY;  Service: Pulmonary;;   HPI: 67 year old cigarette smoker with emphysema who has a new spiculated right upper lobe mass in addition to multiple chronic respiratory problems including COPD, chronic respiratory failure with hypoxemia and recurrent aspiration. CT shows evidence of recurrent aspiration per Dr Lake Bells.  Pt was evaluated by SLP in 01/2020. 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 Pt was recommended to f/u with SLP for therapy, unclear if he did so.   No data recorded   Recommendations for follow up therapy are one component of a multi-disciplinary  discharge planning  process, led by the attending physician.  Recommendations may be updated based on patient status, additional functional criteria and insurance authorization.  Assessment / Plan / Recommendation     02/26/2022   12:00 PM  Clinical Impressions  Clinical Impression Pt demonstrates moderate pharyngeal dysphagia, less severe than supposed. Pts anatomy is characterized by abnormality in nasopharyngeal portal; Seemed more like a fistula than a functional port. Pharyngeal anatomy with only mildly decreased tension in left lateral channel and mild paresis of left arytenoid with adequate laryngeal seal. Good bilateral pharyngeal wall contraction. In terms of swallowing function pt had consistent delay in swallow initiation with thin liquids with sensed aspiration before the swallow. Cues to hold small liquid bolus anteriorally (slight chin tuck also helped), hold breath and swallow improved airway protection with liquids. Pt also had premature spillage of of saliva and particulate matter before the swallow with increased potential for aspiration. Anterior forward head position assisted in improved oral cohesion. Though strength on lleft lateral pharyngeal wall relatively good, the lateral channel was somewhat contrained and residue accumulated there with solid chunks. A tuck to the right clavicle opened lateral channel to clear residue. Overall, a tuck/turn to the right will compensate for several issues with liquids and solids; pt should eat and drink in this position. He would benefit from f/u with Home health SLP for implementation of strategies and strengthening. Pt to continue unrestricted diet.  SLP Visit Diagnosis Dysphagia, oropharyngeal phase (R13.12)  Impact on safety and function Moderate aspiration risk         02/26/2022   12:00 PM  Treatment Recommendations  Treatment Recommendations Defer treatment plan to f/u with SLP        01/23/2020   12:00 PM  Prognosis  Prognosis  for Safe Diet Advancement Good  Barriers to Reach Goals Time post onset;Severity of deficits       02/26/2022   12:00 PM  Diet Recommendations  SLP Diet Recommendations Regular solids;Thin liquid  Liquid Administration via Cup;Straw  Medication Administration Crushed with puree  Compensations Slow rate;Small sips/bites;Other (Comment);Follow solids with liquid  Postural Changes Seated upright at 90 degrees         02/26/2022   12:00 PM  Other Recommendations  Oral Care Recommendations Oral care BID  Follow Up Recommendations Home health SLP       01/23/2020   12:00 PM  Frequency and Duration   Speech Therapy Frequency (ACUTE ONLY) min 2x/week  Treatment Duration 2 weeks         02/26/2022   12:00 PM  Oral Phase  Oral Phase Impaired  Oral - Thin Straw Premature spillage  Oral - Puree WFL  Oral - Regular Premature spillage       02/26/2022   12:00 PM  Pharyngeal Phase  Pharyngeal Phase Impaired  Pharyngeal- Thin Straw Delayed swallow initiation-pyriform sinuses;Reduced epiglottic inversion;Penetration/Aspiration during swallow;Penetration/Aspiration before swallow;Trace aspiration  Pharyngeal Material enters airway, CONTACTS cords and then ejected out;Material enters airway, passes BELOW cords then ejected out;Material does not enter airway  Pharyngeal- Puree WFL  Pharyngeal- Regular Pharyngeal residue - valleculae;Lateral channel residue        01/23/2020   12:00 PM  Cervical Esophageal Phase   Cervical Esophageal Phase Impaired  Thin Cup Reduced cricopharyngeal relaxation  Thin Straw Reduced cricopharyngeal relaxation  Puree Reduced cricopharyngeal relaxation  Mechanical Soft Reduced cricopharyngeal relaxation     Savvy Peeters, Katherene Ponto 02/26/2022, 1:26 PM

## 2022-02-26 NOTE — Progress Notes (Addendum)
[facility] PROGRESS NOTE    Progress Note  Kenneth Coleman  OZH:[id number] DOB: 1955-08-06 DOA: 02/21/2022 PCP: System, Provider Not In     Brief Narrative:   Kenneth Coleman is an 66 y.o. male past medical history significant for chronic diastolic heart failure, COPD/chronic hypoxic respiratory failure on 2 L of oxygen at home, essential hypertension, recurrent pneumonias and pleural effusion requiring chest tube placement crying hospitalization comes in with worsening shortness of breath white count of 7 hemoglobin of 10.CTA at [facility] showed extensive endobronchial debris, with multiloculated left basilar pleural effusion associated with pleural thickening.   Assessment/Plan:   Complicated pleural effusion/aspiration pneumonia: Now on oral antibiotics. Speech evaluated the patient recommended regular thin liquid diet. Awaiting the  FEES studies to be performed. Met with Perative care DNR/DNI, they will continue to work with patient and family.  Incidental 2.2 cm spiculated mass within the right upper lobe: PCCM was consulted status post right upper lobe nodule bronchoscopy reported assisted biopsy. We will need to follow-up with biopsy results of with pulmonary as an outpatient.  Normocytic anemia: No signs of overt bleeding. Anemia panel showed ferritin of 50 saturation 10%. Will need iron supplementation.  Chronic diastolic heart failure: Appears euvolemic on physical exam.  COPD/chronic respiratory failure: Satting greater 90% on room air keep her on 2 to 3 L of oxygen.  CAD: Noted denies any symptoms.  Essential hypertension: Resume Coreg at a lower,   Hypokalemia: Replete orally recheck in the morning.  Severe protein caloric malnutrition: Patient is cachectic nutrition has been consulted.    DVT prophylaxis: lovenox Family Communication:none Status is: Inpatient Remains inpatient appropriate because: Empyema    Code Status:      Code Status Orders  (From admission, onward)           Start     Ordered   02/21/22 0348  Full code  Continuous        02/21/22 0354           Code Status History     Date Active Date Inactive Code Status Order ID Comments User Context   01/15/2020 1926 01/25/2020 2230 Full Code [id number]  [name], DO ED         IV Access:   Peripheral IV   Procedures and diagnostic studies:   No results found.   Medical Consultants:   None.   Subjective:    Kenneth Coleman tolerating his diet he relates his breathing is better.  Objective:    Vitals:   02/25/22 1536 02/25/22 2141 02/26/22 0548 02/26/22 0737  BP: 122/74 116/65 120/69 115/68  Pulse: 81 78 76 75  Resp: _0 Temp: 97.8 F (36.6 C) 98 F (36.7 C) 97.8 F (36.6 C) 98 F (36.7 C)  TempSrc: Oral Oral Oral Oral  SpO2: 100% 100% 100% 100%  Weight:      Height:       SpO2: 100 % O2 Flow Rate (L/min): 4 L/min   Intake/Output Summary (Last 24 hours) at 02/26/2022 0801 Last data filed at 02/26/2022 0519 Gross per 24 hour  Intake --  Output 900 ml  Net -900 ml    Filed Weights   02/21/22 0400 02/23/22 0752  Weight: 63 kg 56.7 kg    Exam: General exam: In no acute distress. Respiratory system: Good air movement and clear to auscultation. Cardiovascular system: S1 & S2 heard, RRR. No JVD. Gastrointestinal system: Abdomen is nondistended, soft and nontender.  Extremities:  No pedal edema. Skin: No rashes, lesions or ulcers Psychiatry: Judgement and insight appear normal. Mood & affect appropriate. Data Reviewed:    Labs: Basic Metabolic Panel: Recent Labs  Lab 02/21/22 0435 02/21/22 1213 02/22/22 0250  NA 145 143 142  K 3.3* 4.4 3.9  CL 100 99 97*  CO2 36* 34* 37*  GLUCOSE 91 85 105*  BUN <5* 7* 10  CREATININE 0.53* 0.44* 0.59*  CALCIUM 8.0* 8.6* 9.0    GFR Estimated Creatinine Clearance: 71.9 mL/min (A) (by C-G formula based on SCr of 0.59 mg/dL (L)). Liver  Function Tests: Recent Labs  Lab 02/21/22 1213  AST 12*  ALT 10  ALKPHOS 54  BILITOT 0.5  PROT 5.9*  ALBUMIN 2.7*    No results for input(s): "LIPASE", "AMYLASE" in the last 168 hours. No results for input(s): "AMMONIA" in the last 168 hours. Coagulation profile Recent Labs  Lab 02/21/22 1213  INR 1.2    COVID-19 Labs  No results for input(s): "DDIMER", "FERRITIN", "LDH", "CRP" in the last 72 hours.   Lab Results  Component Value Date   SARSCOV2NAA NEGATIVE 01/15/2020    CBC: Recent Labs  Lab 02/21/22 0442  WBC 8.1  HGB 9.4*  HCT 32.9*  MCV 87.5  PLT 277    Cardiac Enzymes: No results for input(s): "CKTOTAL", "CKMB", "CKMBINDEX", "TROPONINI" in the last 168 hours. BNP (last 3 results) No results for input(s): "PROBNP" in the last 8760 hours. CBG: No results for input(s): "GLUCAP" in the last 168 hours. D-Dimer: No results for input(s): "DDIMER" in the last 72 hours. Hgb A1c: No results for input(s): "HGBA1C" in the last 72 hours. Lipid Profile: No results for input(s): "CHOL", "HDL", "LDLCALC", "TRIG", "CHOLHDL", "LDLDIRECT" in the last 72 hours. Thyroid function studies: No results for input(s): "TSH", "T4TOTAL", "T3FREE", "THYROIDAB" in the last 72 hours.  Invalid input(s): "FREET3"  Anemia work up: No results for input(s): "VITAMINB12", "FOLATE", "FERRITIN", "TIBC", "IRON", "RETICCTPCT" in the last 72 hours.  Sepsis Labs: Recent Labs  Lab 02/21/22 0442  WBC 8.1  LATICACIDVEN 1.4    Microbiology Recent Results (from the past 240 hour(s))  Culture, blood (Routine X 2) w Reflex to ID Panel     Status: None   Collection Time: 02/21/22  4:42 AM   Specimen: BLOOD RIGHT HAND  Result Value Ref Range Status   Specimen Description BLOOD RIGHT HAND  Final   Special Requests   Final    BOTTLES DRAWN AEROBIC AND ANAEROBIC Blood Culture adequate volume   Culture   Final    NO GROWTH 5 DAYS Performed at [facility], [address],  [place], [place] [address]    Report Status 02/26/2022 FINAL  Final  Culture, blood (Routine X 2) w Reflex to ID Panel     Status: None   Collection Time: 02/21/22  4:42 AM   Specimen: BLOOD  Result Value Ref Range Status   Specimen Description BLOOD LEFT ANTECUBITAL  Final   Special Requests   Final    BOTTLES DRAWN AEROBIC AND ANAEROBIC Blood Culture adequate volume   Culture   Final    NO GROWTH 5 DAYS Performed at [facility], [place] [address] [place], [place] [address]    Report Status 02/26/2022 FINAL  Final     Medications:    carvedilol  3.125 mg Oral BID   digoxin  125 mcg Oral Daily   feeding supplement  237 mL Oral TID WC   pantoprazole  40 mg Oral Daily   umeclidinium-vilanterol  1 puff Inhalation Daily   Continuous Infusions:  ampicillin-sulbactam (UNASYN) IV 3 g (02/26/22 0519)      LOS: 5 days   Charlynne Cousins  [facility]  02/26/2022, 8:01 AM

## 2022-02-27 ENCOUNTER — Other Ambulatory Visit: Payer: Self-pay | Admitting: Emergency Medicine

## 2022-02-27 DIAGNOSIS — R918 Other nonspecific abnormal finding of lung field: Secondary | ICD-10-CM | POA: Diagnosis not present

## 2022-02-27 DIAGNOSIS — J690 Pneumonitis due to inhalation of food and vomit: Secondary | ICD-10-CM | POA: Diagnosis not present

## 2022-02-27 DIAGNOSIS — C3490 Malignant neoplasm of unspecified part of unspecified bronchus or lung: Secondary | ICD-10-CM

## 2022-02-27 DIAGNOSIS — J9611 Chronic respiratory failure with hypoxia: Secondary | ICD-10-CM | POA: Diagnosis not present

## 2022-02-27 LAB — CYTOLOGY - NON PAP

## 2022-02-27 NOTE — Progress Notes (Signed)
Mobility Specialist - Progress Note   02/27/22 0900  Mobility  Activity Ambulated independently in hallway  Level of Assistance Modified independent, requires aide device or extra time  Assistive Device Front wheel walker  Distance Ambulated (ft) 200 ft  Activity Response Tolerated well  $Mobility charge 1 Mobility    Pt received in bed agreeable to mobility. No complaints throughout. Left sitting EOB w/ call bell in reach and all needs met.   [facility] Specialist Please contact via SecureChat or Rehab office at [phone]

## 2022-02-27 NOTE — Progress Notes (Signed)
Referral order placed for Medical Oncology, new dx squamous cell lung cancer

## 2022-02-27 NOTE — Discharge Summary (Addendum)
Physician Discharge Summary  Kenneth Coleman EHU:[id number] DOB: December 08, 1955 DOA: 02/21/2022  PCP: System, Provider Not In  Admit date: 02/21/2022 Discharge date: 02/27/2022  Admitted From: home Disposition:  Home  Recommendations for Outpatient Follow-up:  Follow up with pulmonary in 1-2 weeks follow-up on biopsy results. Will go home with home health. Palliative care to follow-up with the patient as an outpatient   Home Health:No Equipment/Devices:None  Discharge Condition:Stable CODE STATUS:DNR Diet recommendation: Heart Healthy  Brief/Interim Summary: 66 y.o. male past medical history significant for chronic diastolic heart failure, COPD/chronic hypoxic respiratory failure on 2 L of oxygen at home, essential hypertension, recurrent pneumonias and pleural effusion requiring chest tube placement crying hospitalization comes in with worsening shortness of breath white count of 7 hemoglobin of 10.CTA at [facility] showed extensive endobronchial debris, with multiloculated left basilar pleural effusion associated with pleural thickening.    Discharge Diagnoses:  Principal Problem:   Empyema ([place]) Active Problems:   CHF (congestive heart failure) (HCC)   COPD (chronic obstructive pulmonary disease) (HCC)   Coronary artery disease   Hypertension   Aspiration pneumonia (HCC)   Lung mass   Normocytic anemia   Chronic hypoxemic respiratory failure ([place])  Complicated pleural effusion/aspiration pneumonia: He was started empirically on antibiotics he defervesced leukocytosis improved. Completed his course of antibiotics in house. CVTS was consulted as there were concern of empyema they relate there is a chronic appearing left lower lobe parenchymal consolidation. Speech evaluated the patient perform FEES and recommended regular thin liquid diet. In that he met with palliative care and he was made a DNR/DNI, they will continue to work with family and patient as an  outpatient.  Incidental 2.2 cm spiculated mass in the right upper lobe: PCCM was consulted perform a bronchoscopy biopsies were done he will need to follow-up with oncology as an outpatient. Biopsy showed Malignant cells consistent with squamous cell carcinoma .  Normocytic anemia No signs of overt bleeding, follow-up with PCP as an outpatient.  Chronic diastolic heart failure: Appears euvolemic no changes made to his medication.  COPD/chronic respiratory failure with hypoxia: No changes made to his oxygen he remained stable.  CAD: Noted remain asymptomatic.  Essential hypertension: No changes made to his medication.  Hypokalemia: Replete orally now resolved.  Severe protein caloric malnutrition: Speech consulted recommended regular thin liquid diet continue Ensure.  Discharge Instructions  Discharge Instructions     Diet - low sodium heart healthy   Complete by: As directed    Increase activity slowly   Complete by: As directed    No wound care   Complete by: As directed       Allergies as of 02/27/2022   No Known Allergies      Medication List     STOP taking these medications    Eliquis 5 MG Tabs tablet Generic drug: apixaban   levofloxacin 750 MG tablet Commonly known as: LEVAQUIN   sulfamethoxazole-trimethoprim 800-160 MG tablet Commonly known as: BACTRIM DS       TAKE these medications    Advanced Probiotic Caps Take 1 capsule by mouth daily. 5 day course.   albuterol 108 (90 Base) MCG/ACT inhaler Commonly known as: VENTOLIN HFA Inhale 2 puffs into the lungs every 6 (six) hours as needed for wheezing or shortness of breath.   Anoro Ellipta 62.5-25 MCG/ACT Aepb Generic drug: umeclidinium-vilanterol Inhale 1 puff into the lungs daily.   ascorbic acid 250 MG tablet Commonly known as: VITAMIN C Take 250 mg by mouth daily.  Aspirin Low Dose 81 MG tablet Generic drug: aspirin EC Take 81 mg by mouth daily.   carvedilol 25 MG  tablet Commonly known as: COREG Take 25 mg by mouth 2 (two) times daily.   cholecalciferol 25 MCG (1000 UNIT) tablet Commonly known as: VITAMIN D3 Take 1,000 Units by mouth daily.   feeding supplement Liqd Take 237 mLs by mouth 3 (three) times daily between meals. What changed: additional instructions   furosemide 20 MG tablet Commonly known as: LASIX Take 20 mg by mouth daily.   mirtazapine 15 MG tablet Commonly known as: REMERON Take 15 mg by mouth at bedtime.   spironolactone 25 MG tablet Commonly known as: ALDACTONE Take 25 mg by mouth daily.        Follow-up Information     [facility] Follow up.   Contact information: [place] [address] [phone]                No Known Allergies  Consultations: Pulmonary and critical care   Procedures/Studies: DG Chest Port 1 View  Result Date: 02/23/2022 CLINICAL DATA:  Postop, bronchoscopy with biopsy EXAM: PORTABLE CHEST 1 VIEW COMPARISON:  Radiograph 02/21/2022 FINDINGS: Unchanged cardiomediastinal silhouette. Unchanged small loculated left pleural effusion. Unchanged bibasilar opacities. Post biopsy changes associated with known right medial right upper lobe pulmonary nodule. No evidence of pneumothorax. No acute osseous abnormality. Thoracic spondylosis. IMPRESSION: Post-biopsy changes in the right medial upper lung. No evidence of pneumothorax. Unchanged loculated left pleural effusion and bibasilar airspace disease. Electronically Signed   By: Maurine Simmering M.D.   On: 02/23/2022 10:00   DG C-ARM BRONCHOSCOPY  Result Date: 02/23/2022 C-ARM BRONCHOSCOPY: Fluoroscopy was utilized by the requesting physician.  No radiographic interpretation.   CT Super D Chest W Contrast  Result Date: 02/22/2022 CLINICAL DATA:  Right upper lobe spiculated pulmonary nodule EXAM: CT CHEST WITH CONTRAST TECHNIQUE: Multidetector CT imaging of the chest was performed using thin slice  collimation for electromagnetic bronchoscopy planning purposes, with intravenous contrast. RADIATION DOSE REDUCTION: This exam was performed according to the departmental dose-optimization program which includes automated exposure control, adjustment of the mA and/or kV according to patient size and/or use of iterative reconstruction technique. CONTRAST:  4m OMNIPAQUE IOHEXOL 350 MG/ML SOLN COMPARISON:  Chest CT dated February 17, 2022 FINDINGS: Cardiovascular: Normal heart size. No pericardial effusion. Normal caliber thoracic aorta with severe atherosclerotic disease. No visible coronary artery calcifications. Mediastinum/Nodes: Esophagus and thyroid are unremarkable. Mildly enlarged mediastinal lymph nodes, unchanged when compared with recent prior. Reference subcarinal lymph node measuring 1.4 cm in short axis on series 3, image 75 unchanged. Lungs/Pleura: Bilateral bronchial wall thickening scattered endobronchial debris. Moderate centrilobular emphysema. Spiculated juxtapleural solid nodule of the right upper lobe measuring 2.2 x 2.0 cm unchanged. Unchanged Peribronchovascular consolidations and nodularity of the bilateral lower lobes. A few new scattered small pulmonary nodules are visualized. Reference new solid nodule of the right upper lobe measuring 4 mm on series 3, image 50. Additional scattered small solid pulmonary nodules are unchanged when compared prior exam. Reference right upper lobe nodule measuring 3 mm series 3, image 64. Unchanged small left pleural effusion with loculated component of that demonstrates associated pleural thickening. Upper Abdomen: No acute abnormality. Musculoskeletal: No chest wall mass or suspicious bone lesions identified. IMPRESSION: 1. Spiculated solid nodule of the right upper lobe is unchanged in size when compared with recent prior exam and highly concerning for primary lung malignancy. 2. Endobronchial debris, peribronchovascular consolidations,  and nodularity of  the bilateral lower lobes, likely due to aspiration. 3. A few new scattered small pulmonary nodules are visualized, almost certainly sequela of infection or aspiration given rapid interval development 4. Stable mildly enlarged mediastinal lymph nodes, possibly reactive, although metastatic disease can not be excluded. 5. Unchanged small left pleural effusion with loculated component of that demonstrates associated pleural thickening, possibly due to chronicity, although empyema could also have this appearance. 6. Aortic Atherosclerosis (ICD10-I70.0) and Emphysema (ICD10-J43.9). Electronically Signed   By: [name] M.D.   On: 02/22/2022 14:21   ECHOCARDIOGRAM COMPLETE  Result Date: 02/21/2022    ECHOCARDIOGRAM REPORT   Patient Name:   Delyle Weider Date of Exam: 02/21/2022 Medical Rec #:  [id number]      Height:       68.0 in Accession #:    [id number]     Weight:       138.9 lb Date of Birth:  Sep 20, 1955      BSA:          1.750 m Patient Age:    66 years       BP:           128/71 mmHg Patient Gender: M              HR:           81 bpm. Exam Location:  Inpatient Procedure: 2D Echo Indications:    CHF-acute diastolic  History:        Patient has prior history of Echocardiogram examinations, most                 recent 01/17/2020. CAD, COPD; Risk Factors:Current Smoker and                 Hypertension.  Sonographer:    Clayton Lefort RDCS (AE) Referring Phys: Shela Leff  Sonographer Comments: Image acquistion for apical right side challenging due to poor echo window. IMPRESSIONS  1. Left ventricular ejection fraction, by estimation, is 60 to 65%. The left ventricle has normal function. The left ventricle has no regional wall motion abnormalities. Left ventricular diastolic parameters are consistent with Grade I diastolic dysfunction (impaired relaxation).  2. Right ventricular systolic function is normal. The right ventricular size is normal.  3. The mitral valve is normal in structure. No evidence  of mitral valve regurgitation. No evidence of mitral stenosis.  4. The aortic valve is normal in structure. Aortic valve regurgitation is not visualized. No aortic stenosis is present.  5. The inferior vena cava is normal in size with greater than 50% respiratory variability, suggesting right atrial pressure of 3 mmHg. Comparison(s): Prior images reviewed side by side. FINDINGS  Left Ventricle: Left ventricular ejection fraction, by estimation, is 60 to 65%. The left ventricle has normal function. The left ventricle has no regional wall motion abnormalities. The left ventricular internal cavity size was normal in size. There is  no left ventricular hypertrophy. Left ventricular diastolic parameters are consistent with Grade I diastolic dysfunction (impaired relaxation). Right Ventricle: The right ventricular size is normal. No increase in right ventricular wall thickness. Right ventricular systolic function is normal. Left Atrium: Left atrial size was normal in size. Right Atrium: Right atrial size was normal in size. Pericardium: There is no evidence of pericardial effusion. Mitral Valve: The mitral valve is normal in structure. No evidence of mitral valve regurgitation. No evidence of mitral valve stenosis. Tricuspid Valve: The tricuspid valve is normal in structure. Tricuspid valve regurgitation  is not demonstrated. No evidence of tricuspid stenosis. Aortic Valve: The aortic valve is normal in structure. Aortic valve regurgitation is not visualized. No aortic stenosis is present. Aortic valve mean gradient measures 2.0 mmHg. Aortic valve peak gradient measures 4.7 mmHg. Aortic valve area, by VTI measures 3.19 cm. Pulmonic Valve: The pulmonic valve was normal in structure. Pulmonic valve regurgitation is not visualized. No evidence of pulmonic stenosis. Aorta: The aortic root is normal in size and structure. Venous: The inferior vena cava is normal in size with greater than 50% respiratory variability, suggesting  right atrial pressure of 3 mmHg. IAS/Shunts: No atrial level shunt detected by color flow Doppler.  LEFT VENTRICLE PLAX 2D LVIDd:         4.10 cm LVIDs:         3.20 cm LV PW:         1.00 cm LV IVS:        0.80 cm LVOT diam:     2.10 cm LV SV:         60 LV SV Index:   34 LVOT Area:     3.46 cm  LEFT ATRIUM             Index LA diam:        2.60 cm 1.49 cm/m LA Vol (A2C):   34.3 ml 19.60 ml/m LA Vol (A4C):   30.1 ml 17.20 ml/m LA Biplane Vol: 33.7 ml 19.25 ml/m  AORTIC VALVE AV Area (Vmax):    3.75 cm AV Area (Vmean):   3.54 cm AV Area (VTI):     3.19 cm AV Vmax:           108.00 cm/s AV Vmean:          68.700 cm/s AV VTI:            0.187 m AV Peak Grad:      4.7 mmHg AV Mean Grad:      2.0 mmHg LVOT Vmax:         117.00 cm/s LVOT Vmean:        70.200 cm/s LVOT VTI:          0.172 m LVOT/AV VTI ratio: 0.92  AORTA Ao Root diam: 3.50 cm  SHUNTS Systemic VTI:  0.17 m Systemic Diam: 2.10 cm Candee Furbish MD Electronically signed by Candee Furbish MD Signature Date/Time: 02/21/2022/4:43:00 PM    Final    DG Chest 2 View  Result Date: 02/21/2022 CLINICAL DATA:  Pneumonia and shortness of breath EXAM: CHEST - 2 VIEW COMPARISON:  Chest radiograph dated 01/16/2020, CTA chest dated 02/17/2022 FINDINGS: Normal lung volumes. Left lingular and lower lobe confluent and right basilar patchy opacities are again seen. Spiculated right upper lobe mass is better evaluated on prior CT. Left-greater-than-right small bilateral pleural effusions. No pneumothorax. The heart size and mediastinal contours are within normal limits. The visualized skeletal structures are unremarkable. IMPRESSION: 1. Left lingular and lower lobe confluent and right basilar patchy opacities are again seen, suspicious for pneumonia. 2. Spiculated right upper lobe mass is better evaluated on prior CT. 3. Small bilateral pleural effusions. Electronically Signed   By: Darrin Nipper M.D.   On: 02/21/2022 12:20   (Echo, Carotid, EGD, Colonoscopy, ERCP)     Subjective: No complaints  Discharge Exam: Vitals:   02/26/22 2048 02/27/22 0410  BP: (!) 111/57 113/66  Pulse: 85 79  Resp: 17 19  Temp: 98 F (36.7 C) 97.6 F (36.4 C)  SpO2: 100%  100%   Vitals:   02/26/22 1313 02/26/22 1508 02/26/22 2048 02/27/22 0410  BP: 114/61 (!) 124/57 (!) 111/57 113/66  Pulse: 83 76 85 79  Resp: _0 Temp: 98.2 F (36.8 C) 98 F (36.7 C) 98 F (36.7 C) 97.6 F (36.4 C)  TempSrc: Oral Oral Oral Oral  SpO2: 100% 100% 100% 100%  Weight:      Height:        General: Pt is alert, awake, not in acute distress Cardiovascular: RRR, S1/S2 +, no rubs, no gallops Respiratory: CTA bilaterally, no wheezing, no rhonchi Abdominal: Soft, NT, ND, bowel sounds + Extremities: no edema, no cyanosis    The results of significant diagnostics from this hospitalization (including imaging, microbiology, ancillary and laboratory) are listed below for reference.     Microbiology: Recent Results (from the past 240 hour(s))  Culture, blood (Routine X 2) w Reflex to ID Panel     Status: None   Collection Time: 02/21/22  4:42 AM   Specimen: BLOOD RIGHT HAND  Result Value Ref Range Status   Specimen Description BLOOD RIGHT HAND  Final   Special Requests   Final    BOTTLES DRAWN AEROBIC AND ANAEROBIC Blood Culture adequate volume   Culture   Final    NO GROWTH 5 DAYS Performed at [facility], [address], [place], [place] [address]    Report Status 02/26/2022 FINAL  Final  Culture, blood (Routine X 2) w Reflex to ID Panel     Status: None   Collection Time: 02/21/22  4:42 AM   Specimen: BLOOD  Result Value Ref Range Status   Specimen Description BLOOD LEFT ANTECUBITAL  Final   Special Requests   Final    BOTTLES DRAWN AEROBIC AND ANAEROBIC Blood Culture adequate volume   Culture   Final    NO GROWTH 5 DAYS Performed at [facility], [place] [address] [place], [place] [address]    Report Status 02/26/2022 FINAL  Final      Labs: BNP (last 3 results) Recent Labs    02/21/22 0442  BNP 867.6*   Basic Metabolic Panel: Recent Labs  Lab 02/21/22 0435 02/21/22 1213 02/22/22 0250  NA 145 143 142  K 3.3* 4.4 3.9  CL 100 99 97*  CO2 36* 34* 37*  GLUCOSE 91 85 105*  BUN <5* 7* 10  CREATININE 0.53* 0.44* 0.59*  CALCIUM 8.0* 8.6* 9.0   Liver Function Tests: Recent Labs  Lab 02/21/22 1213  AST 12*  ALT 10  ALKPHOS 54  BILITOT 0.5  PROT 5.9*  ALBUMIN 2.7*   No results for input(s): "LIPASE", "AMYLASE" in the last 168 hours. No results for input(s): "AMMONIA" in the last 168 hours. CBC: Recent Labs  Lab 02/21/22 0442  WBC 8.1  HGB 9.4*  HCT 32.9*  MCV 87.5  PLT 277   Cardiac Enzymes: No results for input(s): "CKTOTAL", "CKMB", "CKMBINDEX", "TROPONINI" in the last 168 hours. BNP: Invalid input(s): "POCBNP" CBG: No results for input(s): "GLUCAP" in the last 168 hours. D-Dimer No results for input(s): "DDIMER" in the last 72 hours. Hgb A1c No results for input(s): "HGBA1C" in the last 72 hours. Lipid Profile No results for input(s): "CHOL", "HDL", "LDLCALC", "TRIG", "CHOLHDL", "LDLDIRECT" in the last 72 hours. Thyroid function studies No results for input(s): "TSH", "T4TOTAL", "T3FREE", "THYROIDAB" in the last 72 hours.  Invalid input(s): "FREET3" Anemia work up No results for input(s): "VITAMINB12", "FOLATE", "FERRITIN", "TIBC", "IRON", "  RETICCTPCT" in the last 72 hours. Urinalysis    Component Value Date/Time   COLORURINE YELLOW 02/21/2022 1630   APPEARANCEUR CLEAR 02/21/2022 1630   LABSPEC 1.017 02/21/2022 1630   PHURINE 5.0 02/21/2022 1630   GLUCOSEU NEGATIVE 02/21/2022 1630   HGBUR NEGATIVE 02/21/2022 1630   BILIRUBINUR NEGATIVE 02/21/2022 1630   KETONESUR NEGATIVE 02/21/2022 1630   PROTEINUR NEGATIVE 02/21/2022 1630   NITRITE NEGATIVE 02/21/2022 1630   LEUKOCYTESUR NEGATIVE 02/21/2022 1630   Sepsis Labs Recent Labs  Lab 02/21/22 0442  WBC 8.1    Microbiology Recent Results (from the past 240 hour(s))  Culture, blood (Routine X 2) w Reflex to ID Panel     Status: None   Collection Time: 02/21/22  4:42 AM   Specimen: BLOOD RIGHT HAND  Result Value Ref Range Status   Specimen Description BLOOD RIGHT HAND  Final   Special Requests   Final    BOTTLES DRAWN AEROBIC AND ANAEROBIC Blood Culture adequate volume   Culture   Final    NO GROWTH 5 DAYS Performed at [facility], [place] [address], [place], [place] [address]    Report Status 02/26/2022 FINAL  Final  Culture, blood (Routine X 2) w Reflex to ID Panel     Status: None   Collection Time: 02/21/22  4:42 AM   Specimen: BLOOD  Result Value Ref Range Status   Specimen Description BLOOD LEFT ANTECUBITAL  Final   Special Requests   Final    BOTTLES DRAWN AEROBIC AND ANAEROBIC Blood Culture adequate volume   Culture   Final    NO GROWTH 5 DAYS Performed at [facility], [place] [address] [place], [place] [address]    Report Status 02/26/2022 FINAL  Final     SIGNED:   Charlynne Cousins, MD  [facility] 02/27/2022, 8:16 AM Pager   If 7PM-7AM, please contact night-coverage [email or web address] Password [email or web address]

## 2022-02-27 NOTE — TOC Progression Note (Addendum)
Transition of Care [facility]) - Progression Note    Patient Details  Name: Kenneth Coleman MRN: [id number] Date of Birth: [birth year]  Transition of [facility]) CM/SW Contact  Denece Shearer, Edson Snowball, RN Phone Number: 02/27/2022, 2:50 PM  Clinical Narrative:     Patient being discharged today, states his family cannot pick him up today but if he got a ride home today his daughter would meet him at home. He would also need a portable oxygen tank.   Called [facility] they do not accept patient's insurance   [facility] would need 24 hour notice and patient would need to pay up front ( 183 miles ).   [facility] could transport if [employer] authorizes . They would need oxygen tank from [place] and the latest they could pick patient up is 6 pm. Messaged TOC Leadership awaiting respond   [facility] can bring an oxygen tank to hospital by 6 pm.   TOC Leadership Hassan Rowan returned call. Hospital cannot provide transportation home , patient will have to stay and family transport tomorrow am. Patient aware. Called daughter on speaker phone and discussed, patient has been discharged and insurance will likely not pay for him to stay tonight, hospital cannot provide transportation home. Daughter stated understanding , however they cannot provide transportation home today. Someone will come pick him up in the morning and bring his portable oxygen tank.   [employer] chatted team. Cancelled oxygen tank from [place]   Expected Discharge Plan: [place] Barriers to Discharge: Continued Medical Work up  Expected Discharge Plan and Services Expected Discharge Plan: [place]   Discharge Planning Services: CM Consult Post Acute Care Choice: [place] arrangements for the past 2 months: Single Family Home Expected Discharge Date: 02/27/22               DME Arranged: N/A         HH Arranged: RN, PT HH Agency: [facility] Date HH Agency Contacted:  02/26/22 Time [place]: 1243 Representative spoke with at [place]: Malachy Mood   Social Determinants of Health ([place]) Interventions    Readmission Risk Interventions     No data to display

## 2022-02-28 NOTE — Progress Notes (Signed)
Explained discharge instructions to patient. Reviewed follow up appointment and next medication administration times. Also reviewed education. Patient verbalized having an understanding for instructions given. All belongings are in the patient's possession. IV was removed by patient's nurse. No other needs verbalized. Transported downstairs for discharge.

## 2022-02-28 NOTE — Progress Notes (Signed)
Mobility Specialist - Progress Note   02/28/22 0900  Mobility  Activity Ambulated independently in hallway  Level of Assistance Modified independent, requires aide device or extra time  Assistive Device Front wheel walker  Distance Ambulated (ft) 200 ft  Activity Response Tolerated well  $Mobility charge 1 Mobility    Pt received in bed agreeable to mobility. No complaints throughout, no physical assistance needed. Left sitting EOB w/ RN present at bedside.   [place] Specialist Please contact via SecureChat or Rehab office at [phone]

## 2022-02-28 NOTE — Progress Notes (Signed)
    Durable Medical Equipment  (From admission, onward)           Start     Ordered   02/28/22 1122  For home use only DME 4 wheeled rolling walker with seat  Once       Question:  Patient needs a walker to treat with the following condition  Answer:  Gait instability   02/28/22 1121

## 2022-02-28 NOTE — Progress Notes (Signed)
Addendum to discharge summary  Patient seen, no new complaints. He was supposed to discharge yesterday however could not leave the hospital due to transportation issues. Patient's family is coming to take him today. Please see details of the discharge summary by Dr. Olevia Bowens on 02/28/2019

## 2022-02-28 NOTE — TOC Transition Note (Signed)
Transition of Care [facility]) - CM/SW Discharge Note   Patient Details  Name: Kenneth Coleman MRN: [id number] Date of Birth: [birth year]  Transition of Care [facility]) CM/SW Contact:  Tom-Johnson, Renea Ee, RN Phone Number: 02/28/2022, 11:14 AM   Clinical Narrative:     Patient is scheduled for discharge today. Home health info on AVS. Rollator recommended by PT, ordered from [employer] and [facility] to deliver to patient's home. Daughter transporting at discharge, bringing portable O2 tank with her. No further TOC needs noted  Final next level of care: [place] Barriers to Discharge: Barriers Resolved   Patient Goals and CMS Choice Patient states their goals for this hospitalization and ongoing recovery are:: To return home CMS Medicare.gov Compare Post Acute Care list provided to:: Patient Choice offered to / list presented to : Patient  Discharge Placement                Patient to be transferred to facility by: Daughter      Discharge Plan and Services   Discharge Planning Services: CM Consult Post Acute Care Choice: Home Health          DME Arranged: Walker rolling with seat DME Agency: [employer] Date DME Agency Contacted: 02/28/22 Time DME Agency Contacted: 91 Representative spoke with at DME Agency: [employer] [employer] HH Arranged: RN, PT [place] Agency: [place] Date [place]: 02/26/22 Time [place]: [phone] Representative spoke with at [place]: [place] Determinants of Health (SDOH) Interventions Food Insecurity Interventions: Intervention Not Indicated Housing Interventions: Intervention Not Indicated Transportation Interventions: Inpatient TOC, Patient Resources (Friends/Family) Utilities Interventions: Intervention Not Indicated   Readmission Risk Interventions     No data to display

## 2022-02-28 NOTE — Plan of Care (Signed)

## 2022-03-06 ENCOUNTER — Other Ambulatory Visit: Payer: Self-pay

## 2022-03-06 DIAGNOSIS — C3490 Malignant neoplasm of unspecified part of unspecified bronchus or lung: Secondary | ICD-10-CM

## 2022-03-06 NOTE — Progress Notes (Signed)
Orders placed for brain MRI and PET scan per Dr. Delton Coombes

## 2022-03-07 ENCOUNTER — Ambulatory Visit (HOSPITAL_COMMUNITY)
Admission: RE | Admit: 2022-03-07 | Discharge: 2022-03-07 | Disposition: A | Discharge status: Home / Self Care | Payer: Medicare Other | Source: Ambulatory Visit | Attending: Hematology | Admitting: Hematology

## 2022-03-07 DIAGNOSIS — C3490 Malignant neoplasm of unspecified part of unspecified bronchus or lung: Secondary | ICD-10-CM | POA: Diagnosis present

## 2022-03-07 MED ORDER — GADOBUTROL 1 MMOL/ML IV SOLN
7.0000 mL | Freq: Once | INTRAVENOUS | Status: AC | PRN
  Administered 2022-03-07: 7 mL via INTRAVENOUS

## 2022-03-07 NOTE — Progress Notes (Signed)
All appointments and recommendations regarding PET scan and brain MRI per Dr. Delton Coombes have been discussed with [facility], patient's daughter. All questions addressed and answered.

## 2022-03-08 ENCOUNTER — Encounter (HOSPITAL_COMMUNITY)
Admission: RE | Admit: 2022-03-08 | Discharge: 2022-03-08 | Disposition: A | Discharge status: Home / Self Care | Payer: Medicare Other | Source: Ambulatory Visit | Attending: Hematology | Admitting: Hematology

## 2022-03-08 DIAGNOSIS — C3411 Malignant neoplasm of upper lobe, right bronchus or lung: Secondary | ICD-10-CM | POA: Insufficient documentation

## 2022-03-08 DIAGNOSIS — C3490 Malignant neoplasm of unspecified part of unspecified bronchus or lung: Secondary | ICD-10-CM | POA: Diagnosis present

## 2022-03-08 DIAGNOSIS — C3431 Malignant neoplasm of lower lobe, right bronchus or lung: Secondary | ICD-10-CM | POA: Diagnosis not present

## 2022-03-08 MED ORDER — FLUDEOXYGLUCOSE F - 18 (FDG) INJECTION
6.5500 | Freq: Once | INTRAVENOUS | Status: AC | PRN
  Administered 2022-03-08: 6.55 via INTRAVENOUS

## 2022-03-14 NOTE — Progress Notes (Unsigned)
[facility] CONSULT NOTE  Patient Care Team: System, Provider Not In as PCP - General  CHIEF COMPLAINTS/PURPOSE OF CONSULTATION:  Newly diagnosed squamous cell carcinoma of the right lung.  HISTORY OF PRESENTING ILLNESS:  Kenneth Coleman 66 y.o. male is here for new diagnosis of squamous cell carcinoma of the right lung at the request of Dr. Lamonte Sakai.  Patient reported 10 pound weight loss in the last 3 months due to decreased appetite.  Denies any nausea or vomiting.  He has been on oxygen for 4 years at 3 L/min.  CT angiogram done at [facility] on 02/17/2022 showed 2.2 cm right upper lobe mass suspicious for bronchogenic carcinoma.  This was followed by a super D CT scan of the chest and bronchoscopy and biopsy on 02/23/2022.  Pathology showed right lower lobe endobronchial brushing and biopsy was consistent with squamous cell carcinoma.  Right upper lobe FNA and brushing consistent with squamous cell carcinoma.  PET scan subsequently showed 2 x 2.4 cm right upper lobe nodule with SUV 6.9 and right lower lobe nodule 1.3 x 2.6 cm SUV 8.2.  Hypermetabolic in the right hilum, SUV 2.7.  Patient lives by himself at home and is independent of ADLs and IADLs.  MEDICAL HISTORY:  Past Medical History:  Diagnosis Date   CHF (congestive heart failure) (HCC)    COPD (chronic obstructive pulmonary disease) ([place])    Coronary artery disease    Hypertension     SURGICAL HISTORY: Past Surgical History:  Procedure Laterality Date   BIOPSY  01/16/2020   Procedure: BIOPSY;  Surgeon: Harvel Quale, MD;  [facility];  Service: Gastroenterology;;  esophageal biopsies @25 ,37,20   BRONCHIAL BIOPSY  02/23/2022   Procedure: BRONCHIAL BIOPSIES;  Surgeon: [name], MD;  Location: [facility] ENDOSCOPY;  Service: Pulmonary;;   BRONCHIAL BRUSHINGS  02/23/2022   Procedure: BRONCHIAL BRUSHINGS;  Surgeon: [name], MD;  Location: [facility] ENDOSCOPY;  Service: Pulmonary;;    BRONCHIAL NEEDLE ASPIRATION BIOPSY  02/23/2022   Procedure: BRONCHIAL NEEDLE ASPIRATION BIOPSIES;  Surgeon: [name], MD;  Location: [place];  Service: Pulmonary;;   BRONCHIAL WASHINGS  02/23/2022   Procedure: BRONCHIAL WASHINGS;  Surgeon: [name], MD;  Location: [facility] ENDOSCOPY;  Service: Pulmonary;;   CHOLECYSTECTOMY Bilateral 01/21/2020   Procedure: LAPAROSCOPIC CHOLECYSTECTOMY;  Surgeon: Greer Pickerel, MD;  Location: [place];  Service: General;  Laterality: Bilateral;   ENDOSCOPIC RETROGRADE CHOLANGIOPANCREATOGRAPHY (ERCP) WITH PROPOFOL N/A 01/20/2020   Procedure: ENDOSCOPIC RETROGRADE CHOLANGIOPANCREATOGRAPHY (ERCP) WITH PROPOFOL;  Surgeon: Irving Copas., MD;  Location: [place];  Service: Gastroenterology;  Laterality: N/A;   ESOPHAGOGASTRODUODENOSCOPY (EGD) WITH PROPOFOL N/A 01/16/2020   Procedure: ESOPHAGOGASTRODUODENOSCOPY (EGD) WITH PROPOFOL;  Surgeon: Harvel Quale, MD;  [facility];  Service: Gastroenterology;  Laterality: N/A;   ESOPHAGOGASTRODUODENOSCOPY (EGD) WITH PROPOFOL N/A 01/20/2020   Procedure: ESOPHAGOGASTRODUODENOSCOPY (EGD) WITH PROPOFOL;  Surgeon: Rush Landmark Telford Nab., MD;  Location: [place];  Service: Gastroenterology;  Laterality: N/A;   HEMOSTASIS CLIP PLACEMENT  01/20/2020   Procedure: HEMOSTASIS CLIP PLACEMENT;  Surgeon: Irving Copas., MD;  Location: [place];  Service: Gastroenterology;;   HOT HEMOSTASIS N/A 01/20/2020   Procedure: HOT HEMOSTASIS (ARGON PLASMA COAGULATION/BICAP);  Surgeon: Irving Copas., MD;  Location: [place];  Service: Gastroenterology;  Laterality: N/A;   REMOVAL OF STONES  01/20/2020   Procedure: REMOVAL OF STONES;  Surgeon: Rush Landmark Telford Nab., MD;  Location: [place];  Service: Gastroenterology;;   Lavell Islam REMOVAL  01/20/2020   Procedure: STENT REMOVAL;  Surgeon: Irving Copas., MD;  Location: [place];  Service: Gastroenterology;;    VIDEO BRONCHOSCOPY WITH RADIAL ENDOBRONCHIAL ULTRASOUND  02/23/2022   Procedure: VIDEO BRONCHOSCOPY WITH RADIAL ENDOBRONCHIAL ULTRASOUND;  Surgeon: [name], MD;  Location: [facility] ENDOSCOPY;  Service: Pulmonary;;    SOCIAL HISTORY: Social History   Socioeconomic History   Marital status: Single    Spouse name: Not on file   Number of children: Not on file   Years of education: Not on file   Highest education level: Not on file  Occupational History   Not on file  Tobacco Use   Smoking status: Former    Packs/day: 1.00    Years: 25.00    Total pack years: 25.00    Types: Cigarettes    Quit date: 10/10/2021    Years since quitting: 0.4   Smokeless tobacco: Never  Vaping Use   Vaping Use: Never used  Substance and Sexual Activity   Alcohol use: No   Drug use: Never   Sexual activity: Not Currently  Other Topics Concern   Not on file  Social History Narrative   Not on file   Social Determinants of Health   Financial Resource Strain: Not on file  Food Insecurity: No Food Insecurity (02/28/2022)   Hunger Vital Sign    Worried About Running Out of Food in the Last Year: Never true    Ran Out of Food in the Last Year: Never true  Transportation Needs: No Transportation Needs (02/28/2022)   PRAPARE - Hydrologist (Medical): No    Lack of Transportation (Non-Medical): No  Physical Activity: Not on file  Stress: Not on file  Social Connections: Not on file  Intimate Partner Violence: Not At Risk (02/28/2022)   Humiliation, Afraid, Rape, and Kick questionnaire    Fear of Current or Ex-Partner: No    Emotionally Abused: No    Physically Abused: No    Sexually Abused: No    FAMILY HISTORY: History reviewed. No pertinent family history.  ALLERGIES:  has No Known Allergies.  MEDICATIONS:  Current Outpatient Medications  Medication Sig Dispense Refill   albuterol (VENTOLIN HFA) 108 (90 Base) MCG/ACT inhaler Inhale 2 puffs into the lungs  every 6 (six) hours as needed for wheezing or shortness of breath.     ascorbic acid (VITAMIN C) 250 MG tablet Take 250 mg by mouth daily.     ASPIRIN LOW DOSE 81 MG EC tablet Take 81 mg by mouth daily.     budesonide (PULMICORT) 0.5 MG/2ML nebulizer solution 2 mL by nebulizer 2 times a day     carvedilol (COREG) 25 MG tablet Take 25 mg by mouth 2 (two) times daily.     cholecalciferol (VITAMIN D) 25 MCG (1000 UNIT) tablet Take 1,000 Units by mouth daily.     Docusate Sodium 100 MG capsule Take by mouth.     feeding supplement (ENSURE ENLIVE / ENSURE PLUS) LIQD Take 237 mLs by mouth 3 (three) times daily between meals. (Patient taking differently: Take 237 mLs by mouth 3 (three) times daily between meals. Prefers Boost)     formoterol (PERFOROMIST) 20 MCG/2ML nebulizer solution 2 mL inhaled 2 times a day     furosemide (LASIX) 20 MG tablet Take 20 mg by mouth daily.     mirtazapine (REMERON) 15 MG tablet Take 15 mg by mouth at bedtime.     pantoprazole (PROTONIX) 40 MG tablet  Take by mouth.     Probiotic Product (ADVANCED PROBIOTIC) CAPS Take 1 capsule by mouth daily. 5 day course.     spironolactone (ALDACTONE) 25 MG tablet Take 25 mg by mouth daily.     umeclidinium-vilanterol (ANORO ELLIPTA) 62.5-25 MCG/ACT AEPB Inhale 1 puff into the lungs daily.     No current facility-administered medications for this visit.    REVIEW OF SYSTEMS:   Constitutional: Denies fevers, chills or abnormal night sweats Eyes: Denies blurriness of vision, double vision or watery eyes Ears, nose, mouth, throat, and face: Denies mucositis or sore throat Respiratory: Positive for cough and shortness of breath. Cardiovascular: Denies palpitation, chest discomfort or lower extremity swelling Gastrointestinal:  Denies nausea, heartburn or change in bowel habits Skin: Denies abnormal skin rashes Lymphatics: Denies new lymphadenopathy or easy bruising Neurological:Denies numbness, tingling or new  weaknesses Behavioral/Psych: Mood is stable, no new changes  All other systems were reviewed with the patient and are negative.  PHYSICAL EXAMINATION: ECOG PERFORMANCE STATUS: 1 - Symptomatic but completely ambulatory  Vitals:   03/15/22 1349  BP: 124/75  Pulse: 89  Resp: 18  Temp: (!) 97.5 F (36.4 C)  SpO2: 98%   Filed Weights   03/15/22 1349  Weight: 132 lb 4.8 oz (60 kg)    GENERAL:alert, no distress and comfortable SKIN: skin color, texture, turgor are normal, no rashes or significant lesions EYES: normal, conjunctiva are pink and non-injected, sclera clear OROPHARYNX:no exudate, no erythema and lips, buccal mucosa, and tongue normal  NECK: supple, thyroid normal size, non-tender, without nodularity LYMPH:  no palpable lymphadenopathy in the cervical, axillary or inguinal LUNGS: clear to auscultation and percussion with normal breathing effort HEART: regular rate & rhythm and no murmurs and no lower extremity edema ABDOMEN:abdomen soft, non-tender and normal bowel sounds Musculoskeletal:no cyanosis of digits and no clubbing  PSYCH: alert & oriented x 3 with fluent speech NEURO: no focal motor/sensory deficits  LABORATORY DATA:  I have reviewed the data as listed Lab Results  Component Value Date   WBC 8.1 02/21/2022   HGB 9.4 (L) 02/21/2022   HCT 32.9 (L) 02/21/2022   MCV 87.5 02/21/2022   PLT 277 02/21/2022     Chemistry      Component Value Date/Time   NA 142 02/22/2022 0250   K 3.9 02/22/2022 0250   CL 97 (L) 02/22/2022 0250   CO2 37 (H) 02/22/2022 0250   BUN 10 02/22/2022 0250   CREATININE 0.59 (L) 02/22/2022 0250      Component Value Date/Time   CALCIUM 9.0 02/22/2022 0250   ALKPHOS 54 02/21/2022 1213   AST 12 (L) 02/21/2022 1213   ALT 10 02/21/2022 1213   BILITOT 0.5 02/21/2022 1213       RADIOGRAPHIC STUDIES: I have personally reviewed the radiological images as listed and agreed with the findings in the report. NM PET Image Initial (PI)  Skull Base To Thigh  Result Date: 03/09/2022 CLINICAL DATA:  Under stool treatment strategy for non-small cell lung cancer. EXAM: NUCLEAR MEDICINE PET SKULL BASE TO THIGH TECHNIQUE: 6.6 mCi F-18 FDG was injected intravenously. Full-ring PET imaging was performed from the skull base to thigh after the radiotracer. CT data was obtained and used for attenuation correction and anatomic localization. Fasting blood glucose: 126 mg/dl COMPARISON:  CT chest 02/22/2022. FINDINGS: Mediastinal blood pool activity: SUV max 2.0 Liver activity: SUV max NA NECK: No abnormal hypermetabolism. Incidental CT findings: None. CHEST: Spiculated apical segment right upper lobe nodule measures 2.0  x 2.4 cm, SUV max 6.9. Mild hypermetabolism in the right hilum, SUV max 2.7. Superior segment right lower lobe nodule measures approximately 1.3 x 2.6 cm (9/50), SUV max 8.2. Mild hypermetabolism associated with a small left fibrothorax. Incidental CT findings: Atherosclerotic calcification of the aorta and aortic valve. Heart size normal. No pericardial effusion. Centrilobular and paraseptal emphysema. Debris is seen in the right lower lobe bronchus with airspace consolidation and volume loss in the right lower lobe, as on 02/22/2022. Chronic airspace consolidation, architectural distortion and volume loss in the left lower lobe. Debris in the left lower lobe bronchi. Debris in the airway. ABDOMEN/PELVIS: No abnormal hypermetabolism. Incidental CT findings: Liver and right adrenal gland are unremarkable. Nodular thickening of the left adrenal gland, unchanged. No specific follow-up necessary. Kidneys, spleen, pancreas, stomach and bowel are grossly unremarkable. Cholecystectomy. Atherosclerotic calcification of the aorta. SKELETON: No abnormal osseous hypermetabolism. Incidental CT findings: Degenerative changes in the spine and hips. IMPRESSION: 1. Hypermetabolic right upper and right lower lobe nodules with hypermetabolic right hilar lymph  nodes, findings indicative of synchronous bronchogenic carcinomas. 2. Collapse/consolidation and volume loss in both lower lobes with debris in the lower lobe bronchi, findings suggestive of chronic aspiration. 3. Small left fibrothorax. 4.  Aortic atherosclerosis (ICD10-I70.0). 5.  Emphysema (ICD10-J43.9). Electronically Signed   By: Lorin Picket M.D.   On: 03/09/2022 09:10   MR Brain W Wo Contrast  Result Date: 03/08/2022 CLINICAL DATA:  Non-small cell lung cancer staging. EXAM: MRI HEAD WITHOUT AND WITH CONTRAST TECHNIQUE: Multiplanar, multiecho pulse sequences of the brain and surrounding structures were obtained without and with intravenous contrast. CONTRAST:  47mL GADAVIST GADOBUTROL 1 MMOL/ML IV SOLN COMPARISON:  Head MRI 01/23/2020 FINDINGS: Brain: There is no evidence of an acute infarct, intracranial hemorrhage, mass, midline shift, or extra-axial fluid collection. T2 hyperintensities in the cerebral white matter bilaterally are unchanged and nonspecific but compatible with moderate chronic small vessel ischemic disease. Mild chronic small vessel ischemia is also again noted in the pons. Mild cerebral atrophy is within normal limits for age. No abnormal enhancement is identified. Vascular: Major intracranial vascular flow voids are preserved. Skull and upper cervical spine: Unremarkable bone marrow signal para Sinuses/Orbits: Unremarkable orbits. Clear paranasal sinuses. Large right and small left mastoid effusions. Chronic large perforation or postoperative defect involving the nasal septum. Other: None. IMPRESSION: 1. No evidence of intracranial metastases. 2. Moderate chronic small vessel ischemic disease. Electronically Signed   By: Logan Bores M.D.   On: 03/08/2022 11:29   DG Chest Port 1 View  Result Date: 02/23/2022 CLINICAL DATA:  Postop, bronchoscopy with biopsy EXAM: PORTABLE CHEST 1 VIEW COMPARISON:  Radiograph 02/21/2022 FINDINGS: Unchanged cardiomediastinal silhouette. Unchanged  small loculated left pleural effusion. Unchanged bibasilar opacities. Post biopsy changes associated with known right medial right upper lobe pulmonary nodule. No evidence of pneumothorax. No acute osseous abnormality. Thoracic spondylosis. IMPRESSION: Post-biopsy changes in the right medial upper lung. No evidence of pneumothorax. Unchanged loculated left pleural effusion and bibasilar airspace disease. Electronically Signed   By: Maurine Simmering M.D.   On: 02/23/2022 10:00   DG C-ARM BRONCHOSCOPY  Result Date: 02/23/2022 C-ARM BRONCHOSCOPY: Fluoroscopy was utilized by the requesting physician.  No radiographic interpretation.   CT Super D Chest W Contrast  Result Date: 02/22/2022 CLINICAL DATA:  Right upper lobe spiculated pulmonary nodule EXAM: CT CHEST WITH CONTRAST TECHNIQUE: Multidetector CT imaging of the chest was performed using thin slice collimation for electromagnetic bronchoscopy planning purposes, with intravenous contrast. RADIATION DOSE  REDUCTION: This exam was performed according to the departmental dose-optimization program which includes automated exposure control, adjustment of the mA and/or kV according to patient size and/or use of iterative reconstruction technique. CONTRAST:  46mL OMNIPAQUE IOHEXOL 350 MG/ML SOLN COMPARISON:  Chest CT dated February 17, 2022 FINDINGS: Cardiovascular: Normal heart size. No pericardial effusion. Normal caliber thoracic aorta with severe atherosclerotic disease. No visible coronary artery calcifications. Mediastinum/Nodes: Esophagus and thyroid are unremarkable. Mildly enlarged mediastinal lymph nodes, unchanged when compared with recent prior. Reference subcarinal lymph node measuring 1.4 cm in short axis on series 3, image 75 unchanged. Lungs/Pleura: Bilateral bronchial wall thickening scattered endobronchial debris. Moderate centrilobular emphysema. Spiculated juxtapleural solid nodule of the right upper lobe measuring 2.2 x 2.0 cm unchanged. Unchanged  Peribronchovascular consolidations and nodularity of the bilateral lower lobes. A few new scattered small pulmonary nodules are visualized. Reference new solid nodule of the right upper lobe measuring 4 mm on series 3, image 50. Additional scattered small solid pulmonary nodules are unchanged when compared prior exam. Reference right upper lobe nodule measuring 3 mm series 3, image 64. Unchanged small left pleural effusion with loculated component of that demonstrates associated pleural thickening. Upper Abdomen: No acute abnormality. Musculoskeletal: No chest wall mass or suspicious bone lesions identified. IMPRESSION: 1. Spiculated solid nodule of the right upper lobe is unchanged in size when compared with recent prior exam and highly concerning for primary lung malignancy. 2. Endobronchial debris, peribronchovascular consolidations, and nodularity of the bilateral lower lobes, likely due to aspiration. 3. A few new scattered small pulmonary nodules are visualized, almost certainly sequela of infection or aspiration given rapid interval development 4. Stable mildly enlarged mediastinal lymph nodes, possibly reactive, although metastatic disease can not be excluded. 5. Unchanged small left pleural effusion with loculated component of that demonstrates associated pleural thickening, possibly due to chronicity, although empyema could also have this appearance. 6. Aortic Atherosclerosis (ICD10-I70.0) and Emphysema (ICD10-J43.9). Electronically Signed   By: [name] M.D.   On: 02/22/2022 14:21   ECHOCARDIOGRAM COMPLETE  Result Date: 02/21/2022    ECHOCARDIOGRAM REPORT   Patient Name:   Kenneth Coleman Date of Exam: 02/21/2022 Medical Rec #:  [id number]      Height:       68.0 in Accession #:    [id number]     Weight:       138.9 lb Date of Birth:  08-03-1955      BSA:          1.750 m Patient Age:    30 years       BP:           128/71 mmHg Patient Gender: M              HR:           81 bpm. Exam Location:   Inpatient Procedure: 2D Echo Indications:    CHF-acute diastolic  History:        Patient has prior history of Echocardiogram examinations, most                 recent 01/17/2020. CAD, COPD; Risk Factors:Current Smoker and                 Hypertension.  Sonographer:    Clayton Lefort RDCS (AE) Referring Phys: Shela Leff  Sonographer Comments: Image acquistion for apical right side challenging due to poor echo window. IMPRESSIONS  1. Left ventricular ejection fraction, by estimation, is 60 to 65%. The left  ventricle has normal function. The left ventricle has no regional wall motion abnormalities. Left ventricular diastolic parameters are consistent with Grade I diastolic dysfunction (impaired relaxation).  2. Right ventricular systolic function is normal. The right ventricular size is normal.  3. The mitral valve is normal in structure. No evidence of mitral valve regurgitation. No evidence of mitral stenosis.  4. The aortic valve is normal in structure. Aortic valve regurgitation is not visualized. No aortic stenosis is present.  5. The inferior vena cava is normal in size with greater than 50% respiratory variability, suggesting right atrial pressure of 3 mmHg. Comparison(s): Prior images reviewed side by side. FINDINGS  Left Ventricle: Left ventricular ejection fraction, by estimation, is 60 to 65%. The left ventricle has normal function. The left ventricle has no regional wall motion abnormalities. The left ventricular internal cavity size was normal in size. There is  no left ventricular hypertrophy. Left ventricular diastolic parameters are consistent with Grade I diastolic dysfunction (impaired relaxation). Right Ventricle: The right ventricular size is normal. No increase in right ventricular wall thickness. Right ventricular systolic function is normal. Left Atrium: Left atrial size was normal in size. Right Atrium: Right atrial size was normal in size. Pericardium: There is no evidence of pericardial  effusion. Mitral Valve: The mitral valve is normal in structure. No evidence of mitral valve regurgitation. No evidence of mitral valve stenosis. Tricuspid Valve: The tricuspid valve is normal in structure. Tricuspid valve regurgitation is not demonstrated. No evidence of tricuspid stenosis. Aortic Valve: The aortic valve is normal in structure. Aortic valve regurgitation is not visualized. No aortic stenosis is present. Aortic valve mean gradient measures 2.0 mmHg. Aortic valve peak gradient measures 4.7 mmHg. Aortic valve area, by VTI measures 3.19 cm. Pulmonic Valve: The pulmonic valve was normal in structure. Pulmonic valve regurgitation is not visualized. No evidence of pulmonic stenosis. Aorta: The aortic root is normal in size and structure. Venous: The inferior vena cava is normal in size with greater than 50% respiratory variability, suggesting right atrial pressure of 3 mmHg. IAS/Shunts: No atrial level shunt detected by color flow Doppler.  LEFT VENTRICLE PLAX 2D LVIDd:         4.10 cm LVIDs:         3.20 cm LV PW:         1.00 cm LV IVS:        0.80 cm LVOT diam:     2.10 cm LV SV:         60 LV SV Index:   34 LVOT Area:     3.46 cm  LEFT ATRIUM             Index LA diam:        2.60 cm 1.49 cm/m LA Vol (A2C):   34.3 ml 19.60 ml/m LA Vol (A4C):   30.1 ml 17.20 ml/m LA Biplane Vol: 33.7 ml 19.25 ml/m  AORTIC VALVE AV Area (Vmax):    3.75 cm AV Area (Vmean):   3.54 cm AV Area (VTI):     3.19 cm AV Vmax:           108.00 cm/s AV Vmean:          68.700 cm/s AV VTI:            0.187 m AV Peak Grad:      4.7 mmHg AV Mean Grad:      2.0 mmHg LVOT Vmax:         117.00 cm/s  LVOT Vmean:        70.200 cm/s LVOT VTI:          0.172 m LVOT/AV VTI ratio: 0.92  AORTA Ao Root diam: 3.50 cm  SHUNTS Systemic VTI:  0.17 m Systemic Diam: 2.10 cm Candee Furbish MD Electronically signed by Candee Furbish MD Signature Date/Time: 02/21/2022/4:43:00 PM    Final    DG Chest 2 View  Result Date: 02/21/2022 CLINICAL DATA:   Pneumonia and shortness of breath EXAM: CHEST - 2 VIEW COMPARISON:  Chest radiograph dated 01/16/2020, CTA chest dated 02/17/2022 FINDINGS: Normal lung volumes. Left lingular and lower lobe confluent and right basilar patchy opacities are again seen. Spiculated right upper lobe mass is better evaluated on prior CT. Left-greater-than-right small bilateral pleural effusions. No pneumothorax. The heart size and mediastinal contours are within normal limits. The visualized skeletal structures are unremarkable. IMPRESSION: 1. Left lingular and lower lobe confluent and right basilar patchy opacities are again seen, suspicious for pneumonia. 2. Spiculated right upper lobe mass is better evaluated on prior CT. 3. Small bilateral pleural effusions. Electronically Signed   By: Darrin Nipper M.D.   On: 02/21/2022 12:20    ASSESSMENT:  1.  T4 N1 M0 squamous cell carcinoma of the right lung: - CT angio chest (02/17/2022): No PE.  2.2 cm spiculated mass within the medial right upper lobe. - CT chest super D (02/22/2022): Right upper lobe nodule 2.2 x 2 cm.  Few new scattered pulmonary nodules.  New solid nodule in right upper lobe measures 4 mm.  Additional scattered small solid pulmonary nodules.  Subcarinal lymph node measures 1.4 cm. - Bronchoscopy and biopsy by Dr. Lamonte Sakai on 02/23/2022: Exophytic mucosa, whitish endobronchial lesion noted RLL superior segment airway, brushed and biopsied.  Random transbronchial brushings performed in the LLL region of his chronic infiltrate.  Left-sided airways normal.  Right upper lobe right middle lobe airways were patent. - Pathology (02/23/2022): RLL endobronchial brushing/biopsy: Squamous cell carcinoma.  RUL FNA and brushing: Squamous cell carcinoma.  LLL transbronchial brushing: No malignant cells - Brain MRI (03/07/2022): No evidence of intracranial metastasis. - PET scan (03/08/2022): Right upper lobe nodule 2 x 2.4 cm SUV 6.9.  Mild hypermetabolism in the right hilum SUV 2.7.   Superior segment right lower lobe nodule 1.3 x 2.6 cm, SUV 8.2.  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  Collapse/consolidation and volume loss in both lower lobes with debris in the lower lobe bronchi suggestive of chronic aspiration.  2.  Social/family history: - He lives by himself in [place] and is independent of ADLs and IADLs.  Daughter lives 6 miles away.  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.  Denies any exposure to asbestos.  Quit smoking in July 2023.  Smoked 1 pack/day for 50 years. - No family history of malignancies.  PLAN:  1.  T4 N1 M0 squamous cell carcinoma right lung: - We have reviewed findings on imaging in detail.  We also discussed pathology findings and new diagnosis in detail. - Based on super D CT scan of the chest, he has several subcentimeter new nodules which are below PET resolution. - Treatment options include chemoradiation and systemic therapy. - I have recommended NGS testing to see if he qualifies for single agent immunotherapy.  He would prefer that to chemoradiation given the side effects. - RTC 3 weeks for follow-up to discuss options.   No orders of the defined types were placed  in this encounter.   All questions were answered. The patient knows to call the clinic with any problems, questions or concerns.     Derek Jack, MD 03/15/2022 5:26 PM

## 2022-03-15 ENCOUNTER — Inpatient Hospital Stay: Payer: Medicare Other | Attending: Hematology | Admitting: Hematology

## 2022-03-15 ENCOUNTER — Encounter: Payer: Self-pay | Admitting: Hematology

## 2022-03-15 VITALS — BP 124/75 | HR 89 | Temp 97.5°F | Resp 18 | Ht 69.0 in | Wt 132.3 lb

## 2022-03-15 DIAGNOSIS — Z7982 Long term (current) use of aspirin: Secondary | ICD-10-CM | POA: Insufficient documentation

## 2022-03-15 DIAGNOSIS — C3491 Malignant neoplasm of unspecified part of right bronchus or lung: Secondary | ICD-10-CM | POA: Diagnosis not present

## 2022-03-15 DIAGNOSIS — I110 Hypertensive heart disease with heart failure: Secondary | ICD-10-CM | POA: Diagnosis not present

## 2022-03-15 DIAGNOSIS — Z79899 Other long term (current) drug therapy: Secondary | ICD-10-CM | POA: Diagnosis not present

## 2022-03-15 DIAGNOSIS — I5031 Acute diastolic (congestive) heart failure: Secondary | ICD-10-CM | POA: Insufficient documentation

## 2022-03-15 DIAGNOSIS — Z7951 Long term (current) use of inhaled steroids: Secondary | ICD-10-CM | POA: Diagnosis not present

## 2022-03-15 DIAGNOSIS — Z87891 Personal history of nicotine dependence: Secondary | ICD-10-CM | POA: Diagnosis not present

## 2022-03-15 DIAGNOSIS — C3431 Malignant neoplasm of lower lobe, right bronchus or lung: Secondary | ICD-10-CM | POA: Insufficient documentation

## 2022-03-15 NOTE — Patient Instructions (Addendum)
Kenneth Coleman at [facility] Discharge Instructions   You were seen and examined today by Dr. Delton Coombes. He is a Futures trader (cancer doctor). He is seeing you today for evaluation of lung cancer.   He reviewed with you the results of your PET scan which showed a spot lighting up in the upper part of your right lung, and also a spot lighting up in the lower part of your right lung.   He reviewed the results of the MRI of the brain which did not show any cancer in the brain.   Dr. Lamonte Sakai biopsied these two spots. 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   We will send some additional special testing on these biopsies to get a better picture of what treatment options are for this cancer.   We will plan to see you back in a tentatively in couple of weeks (depending on if the special test results are back) to discuss treatment options with you.   Return as scheduled.    Thank you for choosing [place] at [facility] to provide your oncology and hematology care.  To afford each patient quality time with our provider, please arrive at least 15 minutes before your scheduled appointment time.   If you have a lab appointment with the [facility] please come in thru the Main Entrance and check in at the main information desk.  You need to re-schedule your appointment should you arrive 10 or more minutes late.  We strive to give you quality time with our providers, and arriving late affects you and other patients whose appointments are after yours.  Also, if you no show three or more times for appointments you may be dismissed from the clinic at the providers discretion.     Again, thank you for choosing [facility].  Our hope is that these requests will decrease the amount of time that you wait before being seen by our physicians.        _____________________________________________________________  Should you have questions after your visit to [facility], please contact our office at [phone] and follow the prompts.  Our office hours are 8:00 a.m. and 4:30 p.m. Monday - Friday.  Please note that voicemails left after 4:00 p.m. may not be returned until the following business day.  We are closed weekends and major holidays.  You do have access to a nurse 24-7, just call the main number to the clinic [phone] and do not press any options, hold on the line and a nurse will answer the phone.    For prescription refill requests, have your pharmacy contact our office and allow 72 hours.    Due to Covid, you will need to wear a mask upon entering the hospital. If you do not have a mask, a mask will be given to you at the Main Entrance upon arrival. For doctor visits, patients may have 1 support person age 22 or older with them. For treatment visits, patients can not have anyone with them due to social distancing guidelines and our immunocompromised population.

## 2022-03-16 ENCOUNTER — Other Ambulatory Visit: Payer: Self-pay

## 2022-03-16 NOTE — Progress Notes (Signed)
[employer] testing requested per Dr. Delton Coombes on [id number]

## 2022-03-16 NOTE — Progress Notes (Signed)
The proposed treatment discussed in conference is for discussion purpose only and is not a binding recommendation.  The patients have not been physically examined, or presented with their treatment options.  Therefore, final treatment plans cannot be decided.  

## 2022-04-11 ENCOUNTER — Inpatient Hospital Stay: Payer: Medicare Other | Attending: Hematology | Admitting: Hematology

## 2022-04-11 VITALS — BP 104/56 | HR 69 | Temp 97.9°F | Resp 18 | Ht 69.0 in | Wt 134.4 lb

## 2022-04-11 DIAGNOSIS — Z87891 Personal history of nicotine dependence: Secondary | ICD-10-CM | POA: Insufficient documentation

## 2022-04-11 DIAGNOSIS — C3431 Malignant neoplasm of lower lobe, right bronchus or lung: Secondary | ICD-10-CM | POA: Insufficient documentation

## 2022-04-11 DIAGNOSIS — C3491 Malignant neoplasm of unspecified part of right bronchus or lung: Secondary | ICD-10-CM

## 2022-04-11 NOTE — Patient Instructions (Addendum)
[facility]  Discharge Instructions  You were seen and examined today by Dr. Delton Coombes.  Dr. Delton Coombes ordered specialized testing on your biopsy tissue to see if there are any targetable mutations present in your cancer. Unfortunately, there are not; therefore, Dr. Delton Coombes has recommended treatment treatment with traditional chemotherapy known as Carboplatin and Paclitaxel.  Carboplatin and Paclitaxel are given here in the [place] weekly. This would be given in conjunction with radiation therapy 5 days a week for 5-6 weeks. We will make a referral to the radiation oncologist in [place] to get you prepared for radiation treatments.   In order to administer the chemotherapy safely, you will need a Port-A-Cath. This can be placed by the Radiologist in [place].  Prior to the start of chemotherapy, you will meet with our chemotherapy educator to discuss specific medications, regimen and potential side effects in detail.  Follow-up as scheduled.  Thank you for choosing [facility] to provide your oncology and hematology care.   To afford each patient quality time with our provider, please arrive at least 15 minutes before your scheduled appointment time. You may need to reschedule your appointment if you arrive late (10 or more minutes). Arriving late affects you and other patients whose appointments are after yours.  Also, if you miss three or more appointments without notifying the office, you may be dismissed from the clinic at the provider's discretion.    Again, thank you for choosing [facility].  Our hope is that these requests will decrease the amount of time that you wait before being seen by our physicians.   If you have a lab appointment with the [facility] please come in thru the Main Entrance and check in at the main information desk.            _____________________________________________________________  Should you have questions after your visit to [facility], please contact our office at [phone] and follow the prompts.  Our office hours are 8:00 a.m. to 4:30 p.m. Monday - Thursday and 8:00 a.m. to 2:30 p.m. Friday.  Please note that voicemails left after 4:00 p.m. may not be returned until the following business day.  We are closed weekends and all major holidays.  You do have access to a nurse 24-7, just call the main number to the clinic [phone] and do not press any options, hold on the line and a nurse will answer the phone.    For prescription refill requests, have your pharmacy contact our office and allow 72 hours.    Masks are optional in the cancer centers. If you would like for your care team to wear a mask while they are taking care of you, please let them know. You may have one support person who is at least 68 years old accompany you for your appointments.

## 2022-04-11 NOTE — Progress Notes (Signed)
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    CANCER STAGING: Cancer Staging  Squamous cell lung cancer, right (HCC) Staging form: Lung, AJCC 8th Edition - Clinical stage from 03/15/2022: Stage IIIA (cT4, cN1, cM0) - Unsigned    INTERVAL HISTORY:  Kenneth Coleman 67 y.o. male is seen for follow-up of squamous cell carcinoma of the right lung.  He reports dyspnea on exertion.  He is on 3 L oxygen via nasal cannula.  He reports decrease in energy levels.  HPI: Kenneth Coleman 67 y.o. male is seen for new diagnosis of squamous cell carcinoma of the right lung at the request of Dr. Lamonte Sakai.  Patient reported 10 pound weight loss in the last 3 months due to decreased appetite.  Denies any nausea or vomiting.  He has been on oxygen for 4 years at 3 L/min.  CT angiogram done at [facility] on 02/17/2022 showed 2.2 cm right upper lobe mass suspicious for bronchogenic carcinoma.  This was followed by a super D CT scan of the chest and bronchoscopy and biopsy on 02/23/2022.  Pathology showed right lower lobe endobronchial brushing and biopsy was consistent with squamous cell carcinoma.  Right upper lobe FNA and brushing consistent with squamous cell carcinoma.  PET scan subsequently showed 2 x 2.4 cm right upper lobe nodule with SUV 6.9 and right lower lobe nodule 1.3 x 2.6 cm SUV 8.2.  Hypermetabolic in the right hilum, SUV 2.7.   REVIEW OF SYSTEMS:  Review of Systems  Respiratory:  Positive for shortness of breath (On exertion).   Psychiatric/Behavioral:  Positive for sleep disturbance.   All other  systems reviewed and are negative.    PAST MEDICAL/SURGICAL HISTORY:  Past Medical History:  Diagnosis Date   CHF (congestive heart failure) (HCC)    COPD (chronic obstructive pulmonary disease) ([place])    Coronary artery disease    Hypertension    Past Surgical History:  Procedure Laterality Date   BIOPSY  01/16/2020   Procedure: BIOPSY;  Surgeon: Harvel Quale, MD;  [facility];  Service: Gastroenterology;;  esophageal biopsies _0 ,37,20   BRONCHIAL BIOPSY  02/23/2022   Procedure: BRONCHIAL BIOPSIES;  Surgeon: [name], MD;  Location: [facility];  Service: Pulmonary;;   BRONCHIAL BRUSHINGS  02/23/2022   Procedure: BRONCHIAL BRUSHINGS;  Surgeon: [name], MD;  Location: [facility] ENDOSCOPY;  Service: Pulmonary;;   BRONCHIAL NEEDLE ASPIRATION BIOPSY  02/23/2022   Procedure: BRONCHIAL NEEDLE ASPIRATION BIOPSIES;  Surgeon: [name], MD;  Location[facility] ENDOSCOPY;  Service: Pulmonary;;   BRONCHIAL WASHINGS  02/23/2022   Procedure: BRONCHIAL WASHINGS;  Surgeon: [name], MD;  Location: [facility] ENDOSCOPY;  Service: Pulmonary;;   CHOLECYSTECTOMY Bilateral 01/21/2020   Procedure: LAPAROSCOPIC CHOLECYSTECTOMY;  Surgeon: Greer Pickerel, MD;  Location: [facility];  Service: General;  Laterality: Bilateral;   ENDOSCOPIC RETROGRADE CHOLANGIOPANCREATOGRAPHY (ERCP) WITH PROPOFOL N/A 01/20/2020   Procedure: ENDOSCOPIC RETROGRADE CHOLANGIOPANCREATOGRAPHY (ERCP) WITH PROPOFOL;  Surgeon: Irving Copas., MD;  Location: [facility];  Service: Gastroenterology;  Laterality: N/A;   ESOPHAGOGASTRODUODENOSCOPY (EGD) WITH PROPOFOL N/A 01/16/2020   Procedure:  ESOPHAGOGASTRODUODENOSCOPY (EGD) WITH PROPOFOL;  Surgeon: Montez Morita, Quillian Quince, MD;  [facility];  Service: Gastroenterology;  Laterality: N/A;   ESOPHAGOGASTRODUODENOSCOPY (EGD) WITH PROPOFOL N/A 01/20/2020   Procedure: ESOPHAGOGASTRODUODENOSCOPY (EGD) WITH PROPOFOL;  Surgeon: Rush Landmark Telford Nab., MD;  Location: [facility];  Service: Gastroenterology;  Laterality: N/A;   HEMOSTASIS CLIP PLACEMENT  01/20/2020   Procedure: HEMOSTASIS CLIP PLACEMENT;  Surgeon: Irving Copas., MD;  Location: [facility];  Service: Gastroenterology;;   HOT HEMOSTASIS N/A 01/20/2020   Procedure: HOT HEMOSTASIS (ARGON PLASMA COAGULATION/BICAP);  Surgeon: Irving Copas., MD;  Location: [facility];  Service: Gastroenterology;  Laterality: N/A;   REMOVAL OF STONES  01/20/2020   Procedure: REMOVAL OF STONES;  Surgeon: Mansouraty, Telford Nab., MD;  Location: [facility];  Service: Gastroenterology;;   Lavell Islam REMOVAL  01/20/2020   Procedure: STENT REMOVAL;  Surgeon: Irving Copas., MD;  Location: [facility];  Service: Gastroenterology;;   VIDEO BRONCHOSCOPY WITH RADIAL ENDOBRONCHIAL ULTRASOUND  02/23/2022   Procedure: VIDEO BRONCHOSCOPY WITH RADIAL ENDOBRONCHIAL ULTRASOUND;  Surgeon: [name], MD;  Location: [facility] ENDOSCOPY;  Service: Pulmonary;;     SOCIAL HISTORY:  Social History   Socioeconomic History   Marital status: Single    Spouse name: Not on file   Number of children: Not on file   Years of education: Not on file   Highest education level: Not on file  Occupational History   Not on file  Tobacco Use   Smoking status: Former    Packs/day: 1.00    Years: 25.00    Total pack years: 25.00    Types: Cigarettes    Quit date: 10/10/2021    Years since quitting: 0.5   Smokeless tobacco: Never  Vaping Use   Vaping Use: Never used  Substance and Sexual Activity   Alcohol use: No   Drug use: Never   Sexual activity: Not Currently  Other Topics Concern   Not on file  Social History Narrative   Not on file   Social Determinants of Health   Financial Resource Strain: Not on file  Food Insecurity: No Food Insecurity (02/28/2022)   Hunger Vital Sign    Worried About Running Out of Food in the Last Year: Never true    Ran Out of Food in the Last Year:  Never true  Transportation Needs: No Transportation Needs (02/28/2022)   PRAPARE - Hydrologist (Medical): No    Lack of Transportation (Non-Medical): No  Physical Activity: Not on file  Stress: Not on file  Social Connections: Not on file  Intimate Partner Violence: Not At Risk (02/28/2022)   Humiliation, Afraid, Rape, and Kick questionnaire    Fear of Current or Ex-Partner: No    Emotionally Abused: No    Physically Abused: No    Sexually Abused: No    FAMILY HISTORY:  No family history on file.  CURRENT MEDICATIONS:  Outpatient Encounter Medications as of 04/11/2022  Medication Sig Note   albuterol (VENTOLIN HFA) 108 (90 Base) MCG/ACT inhaler Inhale 2 puffs into the lungs every 6 (six) hours as needed for wheezing or shortness of breath.    ascorbic acid (VITAMIN C) 250 MG tablet Take 250 mg by mouth daily.    ASPIRIN LOW DOSE 81 MG EC tablet Take 81 mg by mouth daily.    budesonide (PULMICORT) 0.5 MG/2ML nebulizer solution 2 mL by nebulizer 2 times a day    carvedilol (COREG) 25 MG tablet Take 25  mg by mouth 2 (two) times daily. 02/23/2022: Daughter takes care of medications. She is unsure if he received this after 02/17/22. She knows he for sure received 02/17/22 (last time she administered).    cholecalciferol (VITAMIN D) 25 MCG (1000 UNIT) tablet Take 1,000 Units by mouth daily.    Docusate Sodium 100 MG capsule Take by mouth.    feeding supplement (ENSURE ENLIVE / ENSURE PLUS) LIQD Take 237 mLs by mouth 3 (three) times daily between meals. (Patient taking differently: Take 237 mLs by mouth 3 (three) times daily between meals. Prefers Boost)    formoterol (PERFOROMIST) 20 MCG/2ML nebulizer solution 2 mL inhaled 2 times a day    furosemide (LASIX) 20 MG tablet Take 20 mg by mouth daily.    mirtazapine (REMERON) 15 MG tablet Take 15 mg by mouth at bedtime.    pantoprazole (PROTONIX) 40 MG tablet Take by mouth.    Probiotic Product (ADVANCED PROBIOTIC)  CAPS Take 1 capsule by mouth daily. 5 day course.    spironolactone (ALDACTONE) 25 MG tablet Take 25 mg by mouth daily.    umeclidinium-vilanterol (ANORO ELLIPTA) 62.5-25 MCG/ACT AEPB Inhale 1 puff into the lungs daily.    No facility-administered encounter medications on file as of 04/11/2022.    ALLERGIES:  No Known Allergies   PHYSICAL EXAM:  ECOG Performance status: 1  Vitals:   04/11/22 1534  BP: (!) 104/56  Pulse: 69  Resp: 18  Temp: 97.9 F (36.6 C)  SpO2: 95%   Filed Weights   04/11/22 1534  Weight: 134 lb 6.4 oz (61 kg)   Physical Exam Vitals reviewed.  Constitutional:      Appearance: Normal appearance.  Cardiovascular:     Rate and Rhythm: Normal rate and regular rhythm.     Heart sounds: Normal heart sounds.  Pulmonary:     Effort: Pulmonary effort is normal.     Breath sounds: Normal breath sounds.  Neurological:     Mental Status: He is alert.  Psychiatric:        Mood and Affect: Mood normal.        Behavior: Behavior normal.      LABORATORY DATA:  I have reviewed the labs as listed.  CBC    Component Value Date/Time   WBC 8.1 02/21/2022 0442   RBC 3.78 (L) 02/21/2022 0442   RBC 3.76 (L) 02/21/2022 0442   HGB 9.4 (L) 02/21/2022 0442   HCT 32.9 (L) 02/21/2022 0442   PLT 277 02/21/2022 0442   MCV 87.5 02/21/2022 0442   MCH 25.0 (L) 02/21/2022 0442   MCHC 28.6 (L) 02/21/2022 0442   RDW 18.2 (H) 02/21/2022 0442   LYMPHSABS 1.5 01/15/2020 1336   MONOABS 0.9 01/15/2020 1336   EOSABS 0.0 01/15/2020 1336   BASOSABS 0.0 01/15/2020 1336      Latest Ref Rng & Units 02/22/2022    2:50 AM 02/21/2022   12:13 PM 02/21/2022    4:35 AM  CMP  Glucose 70 - 99 mg/dL 105  85  91   BUN 8 - 23 mg/dL 10  7  <5   Creatinine 0.61 - 1.24 mg/dL 0.59  0.44  0.53   Sodium 135 - 145 mmol/L 142  143  145   Potassium 3.5 - 5.1 mmol/L 3.9  4.4  3.3   Chloride 98 - 111 mmol/L 97  99  100   CO2 22 - 32 mmol/L 37  34  36   Calcium 8.9 - 10.3 mg/dL  9.0  8.6  8.0    Total Protein 6.5 - 8.1 g/dL  5.9    Total Bilirubin 0.3 - 1.2 mg/dL  0.5    Alkaline Phos 38 - 126 U/L  54    AST 15 - 41 U/L  12    ALT 0 - 44 U/L  10      DIAGNOSTIC IMAGING:  I have independently reviewed the scans and discussed with the patient.  ASSESSMENT:  1.  T4 N1 M0 squamous cell carcinoma of the right lung: - CT angio chest (02/17/2022): No PE.  2.2 cm spiculated mass within the medial right upper lobe. - CT chest super D (02/22/2022): Right upper lobe nodule 2.2 x 2 cm.  Few new scattered pulmonary nodules.  New solid nodule in right upper lobe measures 4 mm.  Additional scattered small solid pulmonary nodules.  Subcarinal lymph node measures 1.4 cm. - Bronchoscopy and biopsy by Dr. Lamonte Sakai on 02/23/2022: Exophytic mucosa, whitish endobronchial lesion noted RLL superior segment airway, brushed and biopsied.  Random transbronchial brushings performed in the LLL region of his chronic infiltrate.  Left-sided airways normal.  Right upper lobe right middle lobe airways were patent. - Pathology (02/23/2022): RLL endobronchial brushing/biopsy: Squamous cell carcinoma.  RUL FNA and brushing: Squamous cell carcinoma.  LLL transbronchial brushing: No malignant cells - Brain MRI (03/07/2022): No evidence of intracranial metastasis. - PET scan (03/08/2022): Right upper lobe nodule 2 x 2.4 cm SUV 6.9.  Mild hypermetabolism in the right hilum SUV 2.7.  Superior segment right lower lobe nodule 1.3 x 2.6 cm, SUV 8.2.  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  Collapse/consolidation and volume loss in both lower lobes with debris in the lower lobe bronchi suggestive of chronic aspiration. - NGS testing: PD-L1-0%, TMB-low, MS-stable, no targetable mutations  2.  Social/family history: - He lives by himself in [place] and is independent of ADLs and IADLs.  Daughter lives 6 miles away.  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.  Denies  any exposure to asbestos.  Quit smoking in July 2023.  Smoked 1 pack/day for 50 years. - No family history of malignancies.   PLAN:  1.  T4 N1 M0 squamous cell carcinoma of the right lung, PD-L1 0%: - We have discussed NGS results showed PD-L1 0% and no other targetable mutations. - We discussed standard treatment with chemoradiation therapy. - We have also discussed the option of no treatment with the best supportive care. - Will make a referral to radiation oncology.  I have also discussed the next best option of SBRT to the 2 lung lesions and possibly hilar lymph node if feasible.  Patient has transportation issues. - I have also discussed at length with his daughter who is accompanying him today. - We will tentatively make an appointment in 3 weeks (after he sees radiation oncology).      Orders placed this encounter:  No orders of the defined types were placed in this encounter.     Derek Jack, MD [place] [phone]

## 2022-04-13 ENCOUNTER — Encounter (HOSPITAL_COMMUNITY): Payer: Self-pay

## 2022-05-03 ENCOUNTER — Ambulatory Visit: Payer: Medicare Other | Admitting: Hematology

## 2022-05-08 ENCOUNTER — Inpatient Hospital Stay: Payer: Medicare Other | Admitting: Hematology

## 2022-05-08 NOTE — Progress Notes (Signed)
Patient's daughter Linton Ham called to advise that patient is still contemplating pursuing treatment at this time. He will call back for appointment if he changes his mind. Dr. Delton Coombes made aware.

## 2022-05-08 NOTE — Progress Notes (Shared)
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    CANCER STAGING: Cancer Staging  Squamous cell lung cancer, right (HCC) Staging form: Lung, AJCC 8th Edition - Clinical stage from 03/15/2022: Stage IIIA (cT4, cN1, cM0) - Unsigned    INTERVAL HISTORY:  Kenneth Coleman 67 y.o. male is seen for follow-up of squamous cell carcinoma of the right lung. He was last seen by me on 04/11/22.  ***     HISTORY OF PRESENT ILLNESS:  Kenneth Coleman 67 y.o. male is seen for new diagnosis of squamous cell carcinoma of the right lung at the request of Dr. Lamonte Sakai.  Patient reported 10 pound weight loss in the last 3 months due to decreased appetite.  Denies any nausea or vomiting.  He has been on oxygen for 4 years at 3 L/min.  CT angiogram done at [facility] on 02/17/2022 showed 2.2 cm right upper lobe mass suspicious for bronchogenic carcinoma.  This was followed by a super D CT scan of the chest and bronchoscopy and biopsy on 02/23/2022.  Pathology showed right lower lobe endobronchial brushing and biopsy was consistent with squamous cell carcinoma.  Right upper lobe FNA and brushing consistent with squamous cell carcinoma.  PET scan subsequently showed 2 x 2.4 cm right upper lobe nodule with SUV 6.9 and right lower lobe nodule 1.3 x 2.6 cm SUV 8.2.  Hypermetabolic in the right hilum, SUV 2.7.   REVIEW OF SYSTEMS:  Review of Systems  Constitutional:  Negative for chills, fatigue and fever.  HENT:   Negative for lump/mass, mouth sores, nosebleeds, sore throat and trouble swallowing.   Respiratory:  Negative  for cough and shortness of breath.   Cardiovascular:  Negative for chest pain, leg swelling and palpitations.  Gastrointestinal:  Negative for abdominal pain, constipation, diarrhea, nausea and vomiting.  Genitourinary:  Negative for bladder incontinence, difficulty urinating, dysuria, frequency, hematuria and nocturia.   Musculoskeletal:  Negative for arthralgias, back pain, flank pain, myalgias and neck pain.  Skin:  Negative for itching and rash.  Neurological:  Negative for dizziness, headaches and numbness.  Hematological:  Does not bruise/bleed easily.  Psychiatric/Behavioral:  Negative for depression, sleep disturbance and suicidal ideas. The patient is not nervous/anxious.   All other systems reviewed and are negative.    PAST MEDICAL/SURGICAL HISTORY:  Past Medical History:  Diagnosis Date   CHF (congestive heart failure) (HCC)    COPD (chronic obstructive pulmonary disease) ([place])    Coronary artery disease    Hypertension    Past Surgical History:  Procedure Laterality Date   BIOPSY  01/16/2020   Procedure: BIOPSY;  Surgeon: Harvel Quale, MD;  Location: [facility];  Service: Gastroenterology;;  esophageal biopsies @25 ,37,20   BRONCHIAL BIOPSY  02/23/2022   Procedure: BRONCHIAL BIOPSIES;  Surgeon: [name], MD;  Location: [facility] ENDOSCOPY;  Service: Pulmonary;;   BRONCHIAL BRUSHINGS  02/23/2022   Procedure: BRONCHIAL BRUSHINGS;  Surgeon: [name], MD;  Location: [facility] ENDOSCOPY;  Service: Pulmonary;;   BRONCHIAL NEEDLE ASPIRATION BIOPSY  02/23/2022   Procedure: BRONCHIAL NEEDLE ASPIRATION BIOPSIES;  Surgeon: Baltazar Apo  S, MD;  Location: [place];  Service: Pulmonary;;   BRONCHIAL WASHINGS  02/23/2022   Procedure: BRONCHIAL WASHINGS;  Surgeon: [name], MD;  Location[facility] END[facility]COPY;  Service: Pulmonary;;   CHOLECYSTECTOMY Bilateral 01/21/2020   Procedure: LAPAROSCOPIC CHOLECYSTECTOMY;  Surgeon: Greer Pickerel, MD;  Location: [place];  Service:  General;  Laterality: Bilateral;   ENDOSCOPIC RETROGRADE CHOLANGIOPANCREATOGRAPHY (ERCP) WITH PROPOFOL N/A 01/20/2020   Procedure: ENDOSCOPIC RETROGRADE CHOLANGIOPANCREATOGRAPHY (ERCP) WITH PROPOFOL;  Surgeon: Irving Copas., MD;  Location: [place];  Service: Gastroenterology;  Laterality: N/A;   ESOPHAGOGASTRODUODENOSCOPY (EGD) WITH PROPOFOL N/A 01/16/2020   Procedure: ESOPHAGOGASTRODUODENOSCOPY (EGD) WITH PROPOFOL;  Surgeon: Harvel Quale, MD;  [facility];  Service: Gastroenterology;  Laterality: N/A;   ESOPHAGOGASTRODUODENOSCOPY (EGD) WITH PROPOFOL N/A 01/20/2020   Procedure: ESOPHAGOGASTRODUODENOSCOPY (EGD) WITH PROPOFOL;  Surgeon: Rush Landmark Telford Nab., MD;  Location: [place];  Service: Gastroenterology;  Laterality: N/A;   HEMOSTASIS CLIP PLACEMENT  01/20/2020   Procedure: HEMOSTASIS CLIP PLACEMENT;  Surgeon: Irving Copas., MD;  Location: [place];  Service: Gastroenterology;;   HOT HEMOSTASIS N/A 01/20/2020   Procedure: HOT HEMOSTASIS (ARGON PLASMA COAGULATION/BICAP);  Surgeon: Irving Copas., MD;  Location: [place];  Service: Gastroenterology;  Laterality: N/A;   REMOVAL OF STONES  01/20/2020   Procedure: REMOVAL OF STONES;  Surgeon: Mansouraty, Telford Nab., MD;  Location: [place];  Service: Gastroenterology;;   Lavell Islam REMOVAL  01/20/2020   Procedure: STENT REMOVAL;  Surgeon: Irving Copas., MD;  Location: [place];  Service: Gastroenterology;;   VIDEO BRONCHOSCOPY WITH RADIAL ENDOBRONCHIAL ULTRASOUND  02/23/2022   Procedure: VIDEO BRONCHOSCOPY WITH RADIAL ENDOBRONCHIAL ULTRASOUND;  Surgeon: [name], MD;  Location: [facility] ENDOSCOPY;  Service: Pulmonary;;     SOCIAL HISTORY:  Social History   Socioeconomic History   Marital status: Single    Spouse name: Not on file   Number of children: Not on file   Years of education: Not on file   Highest education level: Not on file  Occupational  History   Not on file  Tobacco Use   Smoking status: Former    Packs/day: 1.00    Years: 25.00    Total pack years: 25.00    Types: Cigarettes    Quit date: 10/10/2021    Years since quitting: 0.5   Smokeless tobacco: Never  Vaping Use   Vaping Use: Never used  Substance and Sexual Activity   Alcohol use: No   Drug use: Never   Sexual activity: Not Currently  Other Topics Concern   Not on file  Social History Narrative   Not on file   Social Determinants of Health   Financial Resource Strain: Not on file  Food Insecurity: No Food Insecurity (02/28/2022)   Hunger Vital Sign    Worried About Running Out of Food in the Last Year: Never true    Ran Out of Food in the Last Year: Never true  Transportation Needs: No Transportation Needs (02/28/2022)   PRAPARE - Hydrologist (Medical): No    Lack of Transportation (Non-Medical): No  Physical Activity: Not on file  Stress: Not on file  Social Connections: Not on file  Intimate Partner Violence: Not At Risk (02/28/2022)   Humiliation, Afraid, Rape, and Kick questionnaire    Fear of Current or Ex-Partner: No    Emotionally Abused: No    Physically Abused: No    Sexually Abused: No    FAMILY HISTORY:  No family  history on file.  CURRENT MEDICATIONS:  Outpatient Encounter Medications as of 04/11/2022  Medication Sig Note   albuterol (VENTOLIN HFA) 108 (90 Base) MCG/ACT inhaler Inhale 2 puffs into the lungs every 6 (six) hours as needed for wheezing or shortness of breath.    ascorbic acid (VITAMIN C) 250 MG tablet Take 250 mg by mouth daily.    ASPIRIN LOW DOSE 81 MG EC tablet Take 81 mg by mouth daily.    budesonide (PULMICORT) 0.5 MG/2ML nebulizer solution 2 mL by nebulizer 2 times a day    carvedilol (COREG) 25 MG tablet Take 25 mg by mouth 2 (two) times daily. 02/23/2022: Daughter takes care of medications. She is unsure if he received this after 02/17/22. She knows he for sure received 02/17/22  (last time she administered).    cholecalciferol (VITAMIN D) 25 MCG (1000 UNIT) tablet Take 1,000 Units by mouth daily.    Docusate Sodium 100 MG capsule Take by mouth.    feeding supplement (ENSURE ENLIVE / ENSURE PLUS) LIQD Take 237 mLs by mouth 3 (three) times daily between meals. (Patient taking differently: Take 237 mLs by mouth 3 (three) times daily between meals. Prefers Boost)    formoterol (PERFOROMIST) 20 MCG/2ML nebulizer solution 2 mL inhaled 2 times a day    furosemide (LASIX) 20 MG tablet Take 20 mg by mouth daily.    mirtazapine (REMERON) 15 MG tablet Take 15 mg by mouth at bedtime.    pantoprazole (PROTONIX) 40 MG tablet Take by mouth.    Probiotic Product (ADVANCED PROBIOTIC) CAPS Take 1 capsule by mouth daily. 5 day course.    spironolactone (ALDACTONE) 25 MG tablet Take 25 mg by mouth daily.    umeclidinium-vilanterol (ANORO ELLIPTA) 62.5-25 MCG/ACT AEPB Inhale 1 puff into the lungs daily.    No facility-administered encounter medications on file as of 04/11/2022.    ALLERGIES:  No Known Allergies   PHYSICAL EXAM:  ECOG Performance status: 1  Vitals:   04/11/22 1534  BP: (!) 104/56  Pulse: 69  Resp: 18  Temp: 97.9 F (36.6 C)  SpO2: 95%   Filed Weights   04/11/22 1534  Weight: 134 lb 6.4 oz (61 kg)   Physical Exam Vitals reviewed.  Constitutional:      Appearance: Normal appearance.  Cardiovascular:     Rate and Rhythm: Normal rate and regular rhythm.     Pulses: Normal pulses.     Heart sounds: Normal heart sounds.  Pulmonary:     Effort: Pulmonary effort is normal.     Breath sounds: Normal breath sounds.  Neurological:     General: No focal deficit present.     Mental Status: He is alert and oriented to person, place, and time.  Psychiatric:        Mood and Affect: Mood normal.        Behavior: Behavior normal.      LABORATORY DATA:  I have reviewed the labs as listed.      Component Value Date/Time   WBC 8.1 02/21/2022 0442   RBC 3.78  (L) 02/21/2022 0442   RBC 3.76 (L) 02/21/2022 0442   HGB 9.4 (L) 02/21/2022 0442   HCT 32.9 (L) 02/21/2022 0442   PLT 277 02/21/2022 0442   MCV 87.5 02/21/2022 0442   MCH 25.0 (L) 02/21/2022 0442   MCHC 28.6 (L) 02/21/2022 0442   RDW 18.2 (H) 02/21/2022 0442   LYMPHSABS 1.5 01/15/2020 1336   MONOABS 0.9 01/15/2020 1336   EOSABS  0.0 01/15/2020 1336   BASOSABS 0.0 01/15/2020 1336      Latest Ref Rng & Units 02/22/2022    2:50 AM 02/21/2022   12:13 PM 02/21/2022    4:35 AM  CMP  Glucose 70 - 99 mg/dL 105  85  91   BUN 8 - 23 mg/dL 10  7  <5   Creatinine 0.61 - 1.24 mg/dL 0.59  0.44  0.53   Sodium 135 - 145 mmol/L 142  143  145   Potassium 3.5 - 5.1 mmol/L 3.9  4.4  3.3   Chloride 98 - 111 mmol/L 97  99  100   CO2 22 - 32 mmol/L 37  34  36   Calcium 8.9 - 10.3 mg/dL 9.0  8.6  8.0   Total Protein 6.5 - 8.1 g/dL  5.9    Total Bilirubin 0.3 - 1.2 mg/dL  0.5    Alkaline Phos 38 - 126 U/L  54    AST 15 - 41 U/L  12    ALT 0 - 44 U/L  10      DIAGNOSTIC IMAGING:  I have independently reviewed the scans and discussed with the patient.  ASSESSMENT:  1.  T4 N1 M0 squamous cell carcinoma of the right lung: - CT angio chest (02/17/2022): No PE.  2.2 cm spiculated mass within the medial right upper lobe. - CT chest super D (02/22/2022): Right upper lobe nodule 2.2 x 2 cm.  Few new scattered pulmonary nodules.  New solid nodule in right upper lobe measures 4 mm.  Additional scattered small solid pulmonary nodules.  Subcarinal lymph node measures 1.4 cm. - Bronchoscopy and biopsy by Dr. Lamonte Sakai on 02/23/2022: Exophytic mucosa, whitish endobronchial lesion noted RLL superior segment airway, brushed and biopsied.  Random transbronchial brushings performed in the LLL region of his chronic infiltrate.  Left-sided airways normal.  Right upper lobe right middle lobe airways were patent. - Pathology (02/23/2022): RLL endobronchial brushing/biopsy: Squamous cell carcinoma.  RUL FNA and brushing:  Squamous cell carcinoma.  LLL transbronchial brushing: No malignant cells - Brain MRI (03/07/2022): No evidence of intracranial metastasis. - PET scan (03/08/2022): Right upper lobe nodule 2 x 2.4 cm SUV 6.9.  Mild hypermetabolism in the right hilum SUV 2.7.  Superior segment right lower lobe nodule 1.3 x 2.6 cm, SUV 8.2.  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  Collapse/consolidation and volume loss in both lower lobes with debris in the lower lobe bronchi suggestive of chronic aspiration. - NGS testing: PD-L1-0%, TMB-low, MS-stable, no targetable mutations  2.  Social/family history: - He lives by himself in [place] and is independent of ADLs and IADLs.  Daughter lives 6 miles away.  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.  Denies any exposure to asbestos.  Quit smoking in July 2023.  Smoked 1 pack/day for 50 years. - No family history of malignancies.   PLAN:  1.  T4 N1 M0 squamous cell carcinoma of the right lung, PD-L1 0%: - We have discussed NGS results showed PD-L1 0% and no other targetable mutations. - We discussed standard treatment with chemoradiation therapy. - We have also discussed the option of no treatment with the best supportive care. - Will make a referral to radiation oncology.  I have also discussed the next best option of SBRT to the 2 lung lesions and possibly hilar lymph node if feasible.  Patient has transportation issues. - I have also discussed  at length with his daughter who is accompanying him today. - We will tentatively make an appointment in 3 weeks (after he sees radiation oncology).      Orders placed this encounter:  No orders of the defined types were placed in this encounter.    I,Alexis Herring,acting as a Education administrator for Derek Jack, MD.,have documented all relevant documentation on the behalf of Derek Jack, MD,as directed by  Derek Jack, MD while in the  presence of Derek Jack, MD.  ***  Derek Jack, MD [place] [phone]

## 2023-02-08 DEATH — deceased
